# Patient Record
Sex: Female | Born: 1985 | Race: White | Hispanic: No | Marital: Married | Smoking: Current every day smoker
Health system: Southern US, Community
[De-identification: ages and names within clinical notes are randomized; demographics above are authoritative.]

## PROBLEM LIST (undated history)

## (undated) ENCOUNTER — Inpatient Hospital Stay: Payer: Self-pay

## (undated) DIAGNOSIS — F32A Depression, unspecified: Secondary | ICD-10-CM

## (undated) DIAGNOSIS — K219 Gastro-esophageal reflux disease without esophagitis: Secondary | ICD-10-CM

## (undated) DIAGNOSIS — I10 Essential (primary) hypertension: Secondary | ICD-10-CM

## (undated) DIAGNOSIS — IMO0001 Reserved for inherently not codable concepts without codable children: Secondary | ICD-10-CM

## (undated) DIAGNOSIS — O00109 Unspecified tubal pregnancy without intrauterine pregnancy: Secondary | ICD-10-CM

## (undated) DIAGNOSIS — R600 Localized edema: Secondary | ICD-10-CM

## (undated) DIAGNOSIS — Z6841 Body Mass Index (BMI) 40.0 and over, adult: Secondary | ICD-10-CM

## (undated) DIAGNOSIS — F329 Major depressive disorder, single episode, unspecified: Secondary | ICD-10-CM

## (undated) DIAGNOSIS — F419 Anxiety disorder, unspecified: Secondary | ICD-10-CM

## (undated) DIAGNOSIS — I4581 Long QT syndrome: Secondary | ICD-10-CM

## (undated) DIAGNOSIS — R030 Elevated blood-pressure reading, without diagnosis of hypertension: Secondary | ICD-10-CM

## (undated) DIAGNOSIS — O4440 Low lying placenta NOS or without hemorrhage, unspecified trimester: Secondary | ICD-10-CM

## (undated) DIAGNOSIS — Z9851 Tubal ligation status: Secondary | ICD-10-CM

## (undated) HISTORY — DX: Body Mass Index (BMI) 40.0 and over, adult: Z684

## (undated) HISTORY — PX: OTHER SURGICAL HISTORY: SHX169

## (undated) HISTORY — DX: Low lying placenta nos or without hemorrhage, unspecified trimester: O44.40

## (undated) HISTORY — DX: Elevated blood-pressure reading, without diagnosis of hypertension: R03.0

## (undated) HISTORY — DX: Reserved for inherently not codable concepts without codable children: IMO0001

## (undated) HISTORY — DX: Anxiety disorder, unspecified: F41.9

## (undated) HISTORY — DX: Unspecified tubal pregnancy without intrauterine pregnancy: O00.109

## (undated) HISTORY — PX: KNEE SURGERY: SHX244

## (undated) HISTORY — PX: TUBAL LIGATION: SHX77

## (undated) HISTORY — PX: ABDOMINAL HYSTERECTOMY: SHX81

## (undated) HISTORY — PX: CHOLECYSTECTOMY: SHX55

## (undated) HISTORY — DX: Tubal ligation status: Z98.51

---

## 2004-06-24 ENCOUNTER — Emergency Department: Payer: Self-pay | Admitting: Emergency Medicine

## 2004-07-01 ENCOUNTER — Emergency Department: Payer: Self-pay | Admitting: Emergency Medicine

## 2004-12-02 ENCOUNTER — Observation Stay: Payer: Self-pay | Admitting: Obstetrics & Gynecology

## 2004-12-10 ENCOUNTER — Observation Stay: Payer: Self-pay

## 2004-12-13 ENCOUNTER — Inpatient Hospital Stay: Payer: Self-pay | Admitting: Obstetrics & Gynecology

## 2005-01-09 DIAGNOSIS — Z9851 Tubal ligation status: Secondary | ICD-10-CM

## 2005-01-09 HISTORY — DX: Tubal ligation status: Z98.51

## 2005-02-28 ENCOUNTER — Ambulatory Visit: Payer: Self-pay | Admitting: Obstetrics & Gynecology

## 2006-06-07 ENCOUNTER — Emergency Department: Payer: Self-pay | Admitting: General Practice

## 2007-05-19 ENCOUNTER — Emergency Department: Payer: Self-pay | Admitting: Emergency Medicine

## 2007-11-18 ENCOUNTER — Ambulatory Visit: Payer: Self-pay | Admitting: Orthopedic Surgery

## 2007-11-26 ENCOUNTER — Ambulatory Visit: Payer: Self-pay | Admitting: Orthopedic Surgery

## 2008-02-12 ENCOUNTER — Emergency Department: Payer: Self-pay | Admitting: Internal Medicine

## 2008-03-10 ENCOUNTER — Ambulatory Visit: Payer: Self-pay | Admitting: Family Medicine

## 2008-06-11 ENCOUNTER — Emergency Department: Payer: Self-pay | Admitting: Emergency Medicine

## 2010-06-06 ENCOUNTER — Emergency Department: Payer: Self-pay | Admitting: Emergency Medicine

## 2011-08-03 ENCOUNTER — Emergency Department: Payer: Self-pay | Admitting: *Deleted

## 2011-08-06 LAB — BETA STREP CULTURE(ARMC)

## 2012-01-29 ENCOUNTER — Emergency Department: Payer: Self-pay | Admitting: Emergency Medicine

## 2013-03-08 ENCOUNTER — Emergency Department: Payer: Self-pay | Admitting: Emergency Medicine

## 2013-03-08 LAB — URINALYSIS, COMPLETE
BLOOD: NEGATIVE
Bilirubin,UR: NEGATIVE
Glucose,UR: NEGATIVE mg/dL (ref 0–75)
KETONE: NEGATIVE
Leukocyte Esterase: NEGATIVE
Nitrite: NEGATIVE
PROTEIN: NEGATIVE
Ph: 7 (ref 4.5–8.0)
RBC,UR: 6 /HPF (ref 0–5)
SPECIFIC GRAVITY: 1.021 (ref 1.003–1.030)
WBC UR: NONE SEEN /HPF (ref 0–5)

## 2013-03-08 LAB — COMPREHENSIVE METABOLIC PANEL
ALK PHOS: 90 U/L
Albumin: 3.7 g/dL (ref 3.4–5.0)
Anion Gap: 5 — ABNORMAL LOW (ref 7–16)
BUN: 4 mg/dL — ABNORMAL LOW (ref 7–18)
Bilirubin,Total: 0.5 mg/dL (ref 0.2–1.0)
CALCIUM: 8.6 mg/dL (ref 8.5–10.1)
CO2: 25 mmol/L (ref 21–32)
Chloride: 106 mmol/L (ref 98–107)
Creatinine: 0.7 mg/dL (ref 0.60–1.30)
EGFR (African American): >60
GLUCOSE: 89 mg/dL (ref 65–99)
OSMOLALITY: 268 (ref 275–301)
POTASSIUM: 3.4 mmol/L — AB (ref 3.5–5.1)
SGOT(AST): 23 U/L (ref 15–37)
SGPT (ALT): 29 U/L (ref 12–78)
SODIUM: 136 mmol/L (ref 136–145)
TOTAL PROTEIN: 7.5 g/dL (ref 6.4–8.2)

## 2013-03-08 LAB — CBC WITH DIFFERENTIAL/PLATELET
Basophil #: 0 10*3/uL (ref 0.0–0.1)
Basophil %: 0.5 %
EOS ABS: 0 10*3/uL (ref 0.0–0.7)
Eosinophil %: 0.2 %
HCT: 38 % (ref 35.0–47.0)
HGB: 13.2 g/dL (ref 12.0–16.0)
Lymphocyte #: 1.4 10*3/uL (ref 1.0–3.6)
Lymphocyte %: 19.8 %
MCH: 30.8 pg (ref 26.0–34.0)
MCHC: 34.8 g/dL (ref 32.0–36.0)
MCV: 88 fL (ref 80–100)
MONOS PCT: 3.9 %
Monocyte #: 0.3 x10 3/mm (ref 0.2–0.9)
NEUTROS PCT: 75.6 %
Neutrophil #: 5.2 10*3/uL (ref 1.4–6.5)
Platelet: 168 10*3/uL (ref 150–440)
RBC: 4.3 10*6/uL (ref 3.80–5.20)
RDW: 13 % (ref 11.5–14.5)
WBC: 6.9 10*3/uL (ref 3.6–11.0)

## 2013-03-08 LAB — HCG, QUANTITATIVE, PREGNANCY: BETA HCG, QUANT.: 78244 m[IU]/mL — AB

## 2013-04-10 DIAGNOSIS — Z3490 Encounter for supervision of normal pregnancy, unspecified, unspecified trimester: Secondary | ICD-10-CM | POA: Insufficient documentation

## 2013-05-14 ENCOUNTER — Ambulatory Visit: Payer: Self-pay | Admitting: Physician Assistant

## 2013-06-28 ENCOUNTER — Observation Stay: Payer: Self-pay | Admitting: Obstetrics and Gynecology

## 2013-06-28 LAB — URINALYSIS, COMPLETE
Bilirubin,UR: NEGATIVE
Blood: NEGATIVE
GLUCOSE, UR: NEGATIVE mg/dL (ref 0–75)
Ketone: NEGATIVE
LEUKOCYTE ESTERASE: NEGATIVE
Nitrite: NEGATIVE
PROTEIN: NEGATIVE
Ph: 7 (ref 4.5–8.0)
SPECIFIC GRAVITY: 1.018 (ref 1.003–1.030)
Squamous Epithelial: 4
WBC UR: 3 /HPF (ref 0–5)

## 2013-06-28 LAB — GC/CHLAMYDIA PROBE AMP

## 2013-09-17 ENCOUNTER — Observation Stay: Payer: Self-pay

## 2013-09-28 ENCOUNTER — Inpatient Hospital Stay: Payer: Self-pay

## 2013-09-29 LAB — CBC WITH DIFFERENTIAL/PLATELET
Basophil #: 0 10*3/uL (ref 0.0–0.1)
Basophil %: 0.3 %
EOS ABS: 0 10*3/uL (ref 0.0–0.7)
Eosinophil %: 0.4 %
HCT: 32.6 % — ABNORMAL LOW (ref 35.0–47.0)
HGB: 11.3 g/dL — AB (ref 12.0–16.0)
Lymphocyte #: 1.7 10*3/uL (ref 1.0–3.6)
Lymphocyte %: 17.2 %
MCH: 30.2 pg (ref 26.0–34.0)
MCHC: 34.7 g/dL (ref 32.0–36.0)
MCV: 87 fL (ref 80–100)
MONO ABS: 0.5 x10 3/mm (ref 0.2–0.9)
Monocyte %: 5.2 %
NEUTROS ABS: 7.5 10*3/uL — AB (ref 1.4–6.5)
Neutrophil %: 76.9 %
Platelet: 156 10*3/uL (ref 150–440)
RBC: 3.74 10*6/uL — AB (ref 3.80–5.20)
RDW: 14.1 % (ref 11.5–14.5)
WBC: 9.8 10*3/uL (ref 3.6–11.0)

## 2013-10-01 LAB — HEMATOCRIT: HCT: 27.2 % — AB (ref 35.0–47.0)

## 2013-10-03 ENCOUNTER — Ambulatory Visit: Payer: Self-pay | Admitting: Pediatrics

## 2013-11-07 ENCOUNTER — Emergency Department: Payer: Self-pay | Admitting: Student

## 2013-11-10 ENCOUNTER — Emergency Department: Payer: Self-pay | Admitting: Emergency Medicine

## 2013-11-17 LAB — HM PAP SMEAR
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
## 2014-08-10 DIAGNOSIS — O00109 Unspecified tubal pregnancy without intrauterine pregnancy: Secondary | ICD-10-CM

## 2014-08-10 HISTORY — DX: Unspecified tubal pregnancy without intrauterine pregnancy: O00.109

## 2014-08-11 ENCOUNTER — Encounter: Payer: Self-pay | Admitting: Emergency Medicine

## 2014-08-11 DIAGNOSIS — R110 Nausea: Secondary | ICD-10-CM | POA: Insufficient documentation

## 2014-08-11 DIAGNOSIS — Z79899 Other long term (current) drug therapy: Secondary | ICD-10-CM | POA: Insufficient documentation

## 2014-08-11 DIAGNOSIS — Z720 Tobacco use: Secondary | ICD-10-CM | POA: Diagnosis not present

## 2014-08-11 DIAGNOSIS — N8320 Unspecified ovarian cysts: Secondary | ICD-10-CM | POA: Insufficient documentation

## 2014-08-11 DIAGNOSIS — Z3202 Encounter for pregnancy test, result negative: Secondary | ICD-10-CM | POA: Insufficient documentation

## 2014-08-11 DIAGNOSIS — R102 Pelvic and perineal pain: Secondary | ICD-10-CM | POA: Diagnosis present

## 2014-08-11 LAB — URINALYSIS COMPLETE WITH MICROSCOPIC (ARMC ONLY)
BILIRUBIN URINE: NEGATIVE
Glucose, UA: NEGATIVE mg/dL
Hgb urine dipstick: NEGATIVE
KETONES UR: NEGATIVE mg/dL
NITRITE: NEGATIVE
PH: 6 (ref 5.0–8.0)
Protein, ur: NEGATIVE mg/dL
Specific Gravity, Urine: 1.008 (ref 1.005–1.030)

## 2014-08-11 LAB — COMPREHENSIVE METABOLIC PANEL
ALK PHOS: 73 U/L (ref 38–126)
ALT: 18 U/L (ref 14–54)
AST: 18 U/L (ref 15–41)
Albumin: 4 g/dL (ref 3.5–5.0)
Anion gap: 8 (ref 5–15)
BUN: 10 mg/dL (ref 6–20)
CO2: 27 mmol/L (ref 22–32)
Calcium: 9.2 mg/dL (ref 8.9–10.3)
Chloride: 102 mmol/L (ref 101–111)
Creatinine, Ser: 0.84 mg/dL (ref 0.44–1.00)
GFR calc Af Amer: >60 mL/min (ref >60)
GFR calc non Af Amer: >60 mL/min (ref >60)
Glucose, Bld: 99 mg/dL (ref 65–99)
Potassium: 3.8 mmol/L (ref 3.5–5.1)
SODIUM: 137 mmol/L (ref 135–145)
TOTAL PROTEIN: 7.1 g/dL (ref 6.5–8.1)
Total Bilirubin: 0.2 mg/dL — ABNORMAL LOW (ref 0.3–1.2)

## 2014-08-11 LAB — CBC WITH DIFFERENTIAL/PLATELET
BASOS PCT: 0 %
Basophils Absolute: 0 10*3/uL (ref 0–0.1)
Eosinophils Absolute: 0 10*3/uL (ref 0–0.7)
Eosinophils Relative: 1 %
HEMATOCRIT: 34.8 % — AB (ref 35.0–47.0)
HEMOGLOBIN: 12 g/dL (ref 12.0–16.0)
LYMPHS ABS: 1.4 10*3/uL (ref 1.0–3.6)
LYMPHS PCT: 22 %
MCH: 28.1 pg (ref 26.0–34.0)
MCHC: 34.4 g/dL (ref 32.0–36.0)
MCV: 81.8 fL (ref 80.0–100.0)
MONO ABS: 0.3 10*3/uL (ref 0.2–0.9)
Monocytes Relative: 5 %
Neutro Abs: 4.6 10*3/uL (ref 1.4–6.5)
Neutrophils Relative %: 72 %
Platelets: 139 10*3/uL — ABNORMAL LOW (ref 150–440)
RBC: 4.25 MIL/uL (ref 3.80–5.20)
RDW: 14.8 % — AB (ref 11.5–14.5)
WBC: 6.4 10*3/uL (ref 3.6–11.0)

## 2014-08-11 LAB — POCT PREGNANCY, URINE: Preg Test, Ur: NEGATIVE

## 2014-08-11 NOTE — ED Notes (Signed)
Pt presents to ED with pain that radiates across her lower abd for the past 3-4 days with nausea. Denies vomiting. Pain sharp in nature. Denies similar pain previously. Pt is alert and calm with no increased work of breathing noted at this time.

## 2014-08-12 ENCOUNTER — Emergency Department
Admission: EM | Admit: 2014-08-12 | Discharge: 2014-08-12 | Disposition: A | Discharge status: Home / Self Care | Payer: Medicaid Other | Attending: Emergency Medicine | Admitting: Emergency Medicine

## 2014-08-12 ENCOUNTER — Emergency Department: Payer: Medicaid Other

## 2014-08-12 DIAGNOSIS — N83202 Unspecified ovarian cyst, left side: Secondary | ICD-10-CM

## 2014-08-12 DIAGNOSIS — R102 Pelvic and perineal pain: Secondary | ICD-10-CM

## 2014-08-12 MED ORDER — ONDANSETRON 4 MG PO TBDP
4.0000 mg | ORAL_TABLET | Freq: Once | ORAL | Status: AC
  Administered 2014-08-12: 4 mg via ORAL
  Filled 2014-08-12: qty 1

## 2014-08-12 MED ORDER — HYDROCODONE-ACETAMINOPHEN 5-325 MG PO TABS: 1.0000 | ORAL_TABLET | ORAL | Status: DC | PRN

## 2014-08-12 MED ORDER — HYDROCODONE-ACETAMINOPHEN 5-325 MG PO TABS
1.0000 | ORAL_TABLET | Freq: Once | ORAL | Status: AC
  Administered 2014-08-12: 1 via ORAL
  Filled 2014-08-12: qty 1

## 2014-08-12 NOTE — Discharge Instructions (Signed)
Ovarian Cyst An ovarian cyst is a fluid-filled sac that forms on an ovary. The ovaries are small organs that produce eggs in women. Various types of cysts can form on the ovaries. Most are not cancerous. Many do not cause problems, and they often go away on their own. Some may cause symptoms and require treatment. Common types of ovarian cysts include:  Functional cysts--These cysts may occur every month during the menstrual cycle. This is normal. The cysts usually go away with the next menstrual cycle if the woman does not get pregnant. Usually, there are no symptoms with a functional cyst.  Endometrioma cysts--These cysts form from the tissue that lines the uterus. They are also called "chocolate cysts" because they become filled with blood that turns Nikan Ellingson. This type of cyst can cause pain in the lower abdomen during intercourse and with your menstrual period.  Cystadenoma cysts--This type develops from the cells on the outside of the ovary. These cysts can get very big and cause lower abdomen pain and pain with intercourse. This type of cyst can twist on itself, cut off its blood supply, and cause severe pain. It can also easily rupture and cause a lot of pain.  Dermoid cysts--This type of cyst is sometimes found in both ovaries. These cysts may contain different kinds of body tissue, such as skin, teeth, hair, or cartilage. They usually do not cause symptoms unless they get very big.  Theca lutein cysts--These cysts occur when too much of a certain hormone (human chorionic gonadotropin) is produced and overstimulates the ovaries to produce an egg. This is most common after procedures used to assist with the conception of a baby (in vitro fertilization). CAUSES   Fertility drugs can cause a condition in which multiple large cysts are formed on the ovaries. This is called ovarian hyperstimulation syndrome.  A condition called polycystic ovary syndrome can cause hormonal imbalances that can lead to  nonfunctional ovarian cysts. SIGNS AND SYMPTOMS  Many ovarian cysts do not cause symptoms. If symptoms are present, they may include:  Pelvic pain or pressure.  Pain in the lower abdomen.  Pain during sexual intercourse.  Increasing girth (swelling) of the abdomen.  Abnormal menstrual periods.  Increasing pain with menstrual periods.  Stopping having menstrual periods without being pregnant. DIAGNOSIS  These cysts are commonly found during a routine or annual pelvic exam. Tests may be ordered to find out more about the cyst. These tests may include:  Ultrasound.  X-ray of the pelvis.  CT scan.  MRI.  Blood tests. TREATMENT  Many ovarian cysts go away on their own without treatment. Your health care provider may want to check your cyst regularly for 2-3 months to see if it changes. For women in menopause, it is particularly important to monitor a cyst closely because of the higher rate of ovarian cancer in menopausal women. When treatment is needed, it may include any of the following:  A procedure to drain the cyst (aspiration). This may be done using a long needle and ultrasound. It can also be done through a laparoscopic procedure. This involves using a thin, lighted tube with a tiny camera on the end (laparoscope) inserted through a small incision.  Surgery to remove the whole cyst. This may be done using laparoscopic surgery or an open surgery involving a larger incision in the lower abdomen.  Hormone treatment or birth control pills. These methods are sometimes used to help dissolve a cyst. HOME CARE INSTRUCTIONS   Only take over-the-counter   or prescription medicines as directed by your health care provider.  Follow up with your health care provider as directed.  Get regular pelvic exams and Pap tests. SEEK MEDICAL CARE IF:   Your periods are late, irregular, or painful, or they stop.  Your pelvic pain or abdominal pain does not go away.  Your abdomen becomes  larger or swollen.  You have pressure on your bladder or trouble emptying your bladder completely.  You have pain during sexual intercourse.  You have feelings of fullness, pressure, or discomfort in your stomach.  You lose weight for no apparent reason.  You feel generally ill.  You become constipated.  You lose your appetite.  You develop acne.  You have an increase in body and facial hair.  You are gaining weight, without changing your exercise and eating habits.  You think you are pregnant. SEEK IMMEDIATE MEDICAL CARE IF:   You have increasing abdominal pain.  You feel sick to your stomach (nauseous), and you throw up (vomit).  You develop a fever that comes on suddenly.  You have abdominal pain during a bowel movement.  Your menstrual periods become heavier than usual. MAKE SURE YOU:  Understand these instructions.  Will watch your condition.  Will get help right away if you are not doing well or get worse. Document Released: 12/26/2004 Document Revised: 12/31/2012 Document Reviewed: 09/02/2012 [employer] Patient Information 2015 [employer]. This information is not intended to replace advice given to you by your health care provider. Make sure you discuss any questions you have with your health care provider.  

## 2014-08-12 NOTE — ED Notes (Signed)
Pt went to ultrasound.

## 2014-08-12 NOTE — ED Provider Notes (Signed)
[facility] Emergency Department Provider Note  ____________________________________________  Time seen: 4:30 AM  I have reviewed the triage vital signs and the nursing notes.   HISTORY  Chief Complaint Abdominal Pain      HPI Holly Nicholson is a 29 y.o. female presents with bilateral pelvic pain 3-4 days with nausea. Patient denies any fever no dysuria no vaginal discharge no constipation or diarrhea.    Past medical history None There are no active problems to display for this patient.   Past Surgical History  Procedure Laterality Date  . Tubal ligation    . Knee surgery      Current Outpatient Rx  Name  Route  Sig  Dispense  Refill  . ARIPiprazole (ABILIFY) 5 MG tablet   Oral   Take 5 mg by mouth daily.         . cetirizine (ZYRTEC ALLERGY) 10 MG tablet   Oral   Take 10 mg by mouth daily.         . famotidine (PEPCID) 40 MG tablet   Oral   Take 40 mg by mouth daily.         . sertraline (ZOLOFT) 100 MG tablet   Oral   Take 100 mg by mouth daily.         Marland Kitchen HYDROcodone-acetaminophen (NORCO/VICODIN) 5-325 MG per tablet   Oral   Take 1 tablet by mouth every 4 (four) hours as needed for moderate pain.   12 tablet   0     Allergies Percocet  No family history on file.  Social History Social History  Substance Use Topics  . Smoking status: Current Every Day Smoker -- 0.50 packs/day    Types: Cigarettes  . Smokeless tobacco: None  . Alcohol Use: Yes    Review of Systems  Constitutional: Negative for fever. Eyes: Negative for visual changes. ENT: Negative for sore throat. Cardiovascular: Negative for chest pain. Respiratory: Negative for shortness of breath. Gastrointestinal: Negative for abdominal pain, vomiting and diarrhea. Positive for pelvic pain Genitourinary: Negative for dysuria. Musculoskeletal: Negative for back pain. Skin: Negative for rash. Neurological: Negative for headaches, focal weakness or  numbness.   10-point ROS otherwise negative.  ____________________________________________   PHYSICAL EXAM:  VITAL SIGNS: ED Triage Vitals  Enc Vitals Group     BP 08/11/14 2242 133/72 mmHg     Pulse Rate 08/11/14 2242 82     Resp 08/11/14 2242 18     Temp 08/11/14 2242 98.8 F (37.1 C)     Temp Source 08/11/14 2242 Oral     SpO2 08/11/14 2242 100 %     Weight --      Height --      Head Cir --      Peak Flow --      Pain Score 08/12/14 0423 10     Pain Loc --      Pain Edu? --      Excl. in GC? --      Constitutional: Alert and oriented. Well appearing and in no distress. Eyes: Conjunctivae are normal. PERRL. Normal extraocular movements. ENT   Head: Normocephalic and atraumatic.   Nose: No congestion/rhinnorhea.   Mouth/Throat: Mucous membranes are moist.   Neck: No stridor. Cardiovascular: Normal rate, regular rhythm. Normal and symmetric distal pulses are present in all extremities. No murmurs, rubs, or gallops. Respiratory: Normal respiratory effort without tachypnea nor retractions. Breath sounds are clear and equal bilaterally. No wheezes/rales/rhonchi. Gastrointestinal: Soft and  nontender. No distention. There is no CVA tenderness. Genitourinary: deferred Musculoskeletal: Nontender with normal range of motion in all extremities. No joint effusions.  No lower extremity tenderness nor edema. Neurologic:  Normal speech and language. No gross focal neurologic deficits are appreciated. Speech is normal.  Skin:  Skin is warm, dry and intact. No rash noted. Psychiatric: Mood and affect are normal. Speech and behavior are normal. Patient exhibits appropriate insight and judgment.  ____________________________________________    LABS (pertinent positives/negatives)  Labs Reviewed  COMPREHENSIVE METABOLIC PANEL - Abnormal; Notable for the following:    Total Bilirubin 0.2 (*)    All other components within normal limits  CBC WITH DIFFERENTIAL/PLATELET  - Abnormal; Notable for the following:    HCT 34.8 (*)    RDW 14.8 (*)    Platelets 139 (*)    All other components within normal limits  URINALYSIS COMPLETEWITH MICROSCOPIC ([facility] ONLY) - Abnormal; Notable for the following:    Color, Urine STRAW (*)    APPearance HAZY (*)    Leukocytes, UA TRACE (*)    Bacteria, UA RARE (*)    Squamous Epithelial / LPF 6-30 (*)    All other components within normal limits  POC URINE PREG, ED  POCT PREGNANCY, URINE        RADIOLOGY     US Pelvis Complete (Final result) Result time: 08/12/14 05:23:21   Final result by Rad Results In Interface (08/12/14 05:23:21)   Narrative:   CLINICAL DATA: Initial evaluation for acute pelvic pain for 3-4 days.  EXAM: TRANSABDOMINAL AND TRANSVAGINAL ULTRASOUND OF PELVIS  TECHNIQUE: Both transabdominal and transvaginal ultrasound examinations of the pelvis were performed. Transabdominal technique was performed for global imaging of the pelvis including uterus, ovaries, adnexal regions, and pelvic cul-de-sac. It was necessary to proceed with endovaginal exam following the transabdominal exam to visualize the uterus and ovaries.  COMPARISON: None  FINDINGS: Uterus  Measurements: 8.4 x 4.2 x 5.4 cm. No fibroids or other mass visualized.  Endometrium  Thickness: 16 mm. Small 5 mm cystic lesion at the uterine fundus may reflect a small amount of trapped fluid.  Right ovary  Measurements: 3.7 x 2.8 x 2.3 cm. Normal appearance. No adnexal mass.  Left ovary  Measurements: 4.6 x 3.1 x 3.6 cm. Normal appearance. Complex cystic lesion measuring 3.2 x 2.5 x 2.5 cm, most consistent with a hemorrhagic cyst.  Other findings  No free fluid.  IMPRESSION: 1. 3.2 cm hemorrhagic left ovarian cyst. 2. Tiny 5 mm lucent lesion within the endometrial canal near the uterine fundus. Finding is favored to reflect a small amount of trapped fluid, and is of doubtful clinical significance. 3. Otherwise  unremarkable pelvic ultrasound.   Electronically Signed By: Rise Mu M.D. On: 08/12/2014 05:23           INITIAL IMPRESSION / ASSESSMENT AND PLAN / ED COURSE  Pertinent labs & imaging results that were available during my care of the patient were reviewed by me and considered in my medical decision making (see chart for details).    ____________________________________________   FINAL CLINICAL IMPRESSION(S) / ED DIAGNOSES  Final diagnoses:  Left ovarian cyst      Darci Current, MD 08/20/14 [phone]

## 2014-08-17 DIAGNOSIS — O3680X0 Pregnancy with inconclusive fetal viability, not applicable or unspecified: Secondary | ICD-10-CM | POA: Insufficient documentation

## 2014-08-20 ENCOUNTER — Encounter: Payer: Self-pay | Admitting: Family Medicine

## 2014-08-20 ENCOUNTER — Ambulatory Visit (INDEPENDENT_AMBULATORY_CARE_PROVIDER_SITE_OTHER): Payer: Medicaid Other | Admitting: Family Medicine

## 2014-08-20 VITALS — BP 114/78 | HR 82 | Temp 98.0°F | Ht 62.5 in | Wt 249.0 lb

## 2014-08-20 DIAGNOSIS — Z302 Encounter for sterilization: Secondary | ICD-10-CM | POA: Insufficient documentation

## 2014-08-20 DIAGNOSIS — R51 Headache: Secondary | ICD-10-CM

## 2014-08-20 DIAGNOSIS — O009 Unspecified ectopic pregnancy without intrauterine pregnancy: Secondary | ICD-10-CM

## 2014-08-20 DIAGNOSIS — Z0000 Encounter for general adult medical examination without abnormal findings: Secondary | ICD-10-CM | POA: Insufficient documentation

## 2014-08-20 DIAGNOSIS — F411 Generalized anxiety disorder: Secondary | ICD-10-CM | POA: Diagnosis not present

## 2014-08-20 DIAGNOSIS — R5383 Other fatigue: Secondary | ICD-10-CM | POA: Insufficient documentation

## 2014-08-20 DIAGNOSIS — F3132 Bipolar disorder, current episode depressed, moderate: Secondary | ICD-10-CM | POA: Insufficient documentation

## 2014-08-20 DIAGNOSIS — D649 Anemia, unspecified: Secondary | ICD-10-CM | POA: Diagnosis not present

## 2014-08-20 DIAGNOSIS — Z720 Tobacco use: Secondary | ICD-10-CM | POA: Insufficient documentation

## 2014-08-20 DIAGNOSIS — Z5181 Encounter for therapeutic drug level monitoring: Secondary | ICD-10-CM | POA: Diagnosis not present

## 2014-08-20 DIAGNOSIS — R519 Headache, unspecified: Secondary | ICD-10-CM | POA: Insufficient documentation

## 2014-08-20 DIAGNOSIS — R030 Elevated blood-pressure reading, without diagnosis of hypertension: Secondary | ICD-10-CM | POA: Insufficient documentation

## 2014-08-20 DIAGNOSIS — R1110 Vomiting, unspecified: Secondary | ICD-10-CM | POA: Insufficient documentation

## 2014-08-20 DIAGNOSIS — G8929 Other chronic pain: Secondary | ICD-10-CM

## 2014-08-20 DIAGNOSIS — R112 Nausea with vomiting, unspecified: Secondary | ICD-10-CM | POA: Diagnosis not present

## 2014-08-20 MED ORDER — ONDANSETRON 4 MG PO TBDP: 4.0000 mg | ORAL_TABLET | Freq: Three times a day (TID) | ORAL | Status: DC | PRN
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
## 2014-08-20 NOTE — Assessment & Plan Note (Signed)
Vomited here in the office; last phenergan around 5 am (5-6 hours prior); new Rx for zofran given

## 2014-08-20 NOTE — Assessment & Plan Note (Signed)
Talked with patient about her anxiety; will r/o rare pheo since she has anxiety attacks accompanied by headaches, high BP, flushing, anger; I am not going to put her on any medicine for her anxiety, though (which is what she was requesting today); I encouraged her instead to continue to work with her counselor to consider adjusting her medicines or doing other therapy to treat anxiety; if her anxiety is not due to a medical condition like hyperthyroidism or a pheo, then CBT may be helpful

## 2014-08-20 NOTE — Assessment & Plan Note (Signed)
Check labs; likely deconditioning; discuss exercise and diet at f/u

## 2014-08-20 NOTE — Assessment & Plan Note (Signed)
Currently under the care of psychiatrist

## 2014-08-20 NOTE — Assessment & Plan Note (Signed)
On atypical antipsychotic, pt reports no labs to monitor glucose and lipids

## 2014-08-20 NOTE — Assessment & Plan Note (Addendum)
Check CBC today; patient says she was "born with it" so will see if indices suggest a thalassemia

## 2014-08-20 NOTE — Assessment & Plan Note (Addendum)
cessation information given for tips to quit in AVS

## 2014-08-20 NOTE — Assessment & Plan Note (Signed)
Reviewed ER record from [place] which showed negative pregnancy test, but I am unable to review [facility] records where patient reports the positive pregnancy

## 2014-08-20 NOTE — Patient Instructions (Addendum)
We'll contact you about your blood work if anything is urgent prior to your next appointment  Do continue to work with your psychiatrist about your anxiety; it will be best if only one of us is prescribing medicines for your aKoreanxiety and mood and mental health  Gina --> please request records from [facility] ER visit including labs and [place] reports  Please start collecting your urine after your next episode of anxiety, high blood pressure, headache, etc. Save all of the urine for the next 24 hours and return that container to [place] for testing.  Return in 1 to 2 weeks for follow-up     Smoking Cessation, Tips for Success If you are ready to quit smoking, congratulations! You have chosen to help yourself be healthier. Cigarettes bring nicotine, tar, carbon monoxide, and other irritants into your body. Your lungs, heart, and blood vessels will be able to work better without these poisons. There are many different ways to quit smoking. Nicotine gum, nicotine patches, a nicotine inhaler, or nicotine nasal spray can help with physical craving. Hypnosis, support groups, and medicines help break the habit of smoking. WHAT THINGS CAN I DO TO MAKE QUITTING EASIER?  Here are some tips to help you quit for good:  Pick a date when you will quit smoking completely. Tell all of your friends and family about your plan to quit on that date.  Do not try to slowly cut down on the number of cigarettes you are smoking. Pick a quit date and quit smoking completely starting on that day.  Throw away all cigarettes.   Clean and remove all ashtrays from your home, work, and car.  On a card, write down your reasons for quitting. Carry the card with you and read it when you get the urge to smoke.  Cleanse your body of nicotine. Drink enough water and fluids to keep your urine clear or pale yellow. Do this after quitting to flush the nicotine from your body.  Learn to predict your moods. Do not let a bad situation be your  excuse to have a cigarette. Some situations in your life might tempt you into wanting a cigarette.  Never have "just one" cigarette. It leads to wanting another and another. Remind yourself of your decision to quit.  Change habits associated with smoking. If you smoked while driving or when feeling stressed, try other activities to replace smoking. Stand up when drinking your coffee. Brush your teeth after eating. Sit in a different chair when you read the paper. Avoid alcohol while trying to quit, and try to drink fewer caffeinated beverages. Alcohol and caffeine may urge you to smoke.  Avoid foods and drinks that can trigger a desire to smoke, such as sugary or spicy foods and alcohol.  Ask people who smoke not to smoke around you.  Have something planned to do right after eating or having a cup of coffee. For example, plan to take a walk or exercise.  Try a relaxation exercise to calm you down and decrease your stress. Remember, you may be tense and nervous for the first 2 weeks after you quit, but this will pass.  Find new activities to keep your hands busy. Play with a pen, coin, or rubber band. Doodle or draw things on paper.  Brush your teeth right after eating. This will help cut down on the craving for the taste of tobacco after meals. You can also try mouthwash.   Use oral substitutes in place of cigarettes. Try  using lemon drops, carrots, cinnamon sticks, or chewing gum. Keep them handy so they are available when you have the urge to smoke.  When you have the urge to smoke, try deep breathing.  Designate your home as a nonsmoking area.  If you are a heavy smoker, ask your health care provider about a prescription for nicotine chewing gum. It can ease your withdrawal from nicotine.  Reward yourself. Set aside the cigarette money you save and buy yourself something nice.  Look for support from others. Join a support group or smoking cessation program. Ask someone at home or at  work to help you with your plan to quit smoking.  Always ask yourself, "Do I need this cigarette or is this just a reflex?" Tell yourself, "Today, I choose not to smoke," or "I do not want to smoke." You are reminding yourself of your decision to quit.  Do not replace cigarette smoking with electronic cigarettes (commonly called e-cigarettes). The safety of e-cigarettes is unknown, and some may contain harmful chemicals.  If you relapse, do not give up! Plan ahead and think about what you will do the next time you get the urge to smoke. HOW WILL I FEEL WHEN I QUIT SMOKING? You may have symptoms of withdrawal because your body is used to nicotine (the addictive substance in cigarettes). You may crave cigarettes, be irritable, feel very hungry, cough often, get headaches, or have difficulty concentrating. The withdrawal symptoms are only temporary. They are strongest when you first quit but will go away within 10-14 days. When withdrawal symptoms occur, stay in control. Think about your reasons for quitting. Remind yourself that these are signs that your body is healing and getting used to being without cigarettes. Remember that withdrawal symptoms are easier to treat than the major diseases that smoking can cause.  Even after the withdrawal is over, expect periodic urges to smoke. However, these cravings are generally short lived and will go away whether you smoke or not. Do not smoke! WHAT RESOURCES ARE AVAILABLE TO HELP ME QUIT SMOKING? Your health care provider can direct you to community resources or hospitals for support, which may include:  Group support.  Education.  Hypnosis.  Therapy. Document Released: 09/24/2003 Document Revised: 05/12/2013 Document Reviewed: 06/13/2012 [facility] Patient Information 2015 [place], [place]. This information is not intended to replace advice given to you by your health care provider. Make sure you discuss any questions you have with your health care  provider. DASH Eating Plan DASH stands for "Dietary Approaches to Stop Hypertension." The DASH eating plan is a healthy eating plan that has been shown to reduce high blood pressure (hypertension). Additional health benefits may include reducing the risk of type 2 diabetes mellitus, heart disease, and stroke. The DASH eating plan may also help with weight loss. WHAT DO I NEED TO KNOW ABOUT THE DASH EATING PLAN? For the DASH eating plan, you will follow these general guidelines:  Choose foods with a percent daily value for sodium of less than 5% (as listed on the food label).  Use salt-free seasonings or herbs instead of table salt or sea salt.  Check with your health care provider or pharmacist before using salt substitutes.  Eat lower-sodium products, often labeled as "lower sodium" or "no salt added."  Eat fresh foods.  Eat more vegetables, fruits, and low-fat dairy products.  Choose whole grains. Look for the word "whole" as the first word in the ingredient list.  Choose fish and skinless chicken or [place]  more often than red meat. Limit fish, poultry, and meat to 6 oz (170 g) each day.  Limit sweets, desserts, sugars, and sugary drinks.  Choose heart-healthy fats.  Limit cheese to 1 oz (28 g) per day.  Eat more home-cooked food and less restaurant, buffet, and fast food.  Limit fried foods.  Cook foods using methods other than frying.  Limit canned vegetables. If you do use them, rinse them well to decrease the sodium.  When eating at a restaurant, ask that your food be prepared with less salt, or no salt if possible. WHAT FOODS CAN I EAT? Seek help from a dietitian for individual calorie needs. Grains Whole grain or whole wheat bread. Brown rice. Whole grain or whole wheat pasta. Quinoa, bulgur, and whole grain cereals. Low-sodium cereals. Corn or whole wheat flour tortillas. Whole grain cornbread. Whole grain crackers. Low-sodium crackers. Vegetables Fresh or frozen  vegetables (raw, steamed, roasted, or grilled). Low-sodium or reduced-sodium tomato and vegetable juices. Low-sodium or reduced-sodium tomato sauce and paste. Low-sodium or reduced-sodium canned vegetables.  Fruits All fresh, canned (in natural juice), or frozen fruits. Meat and Other Protein Products Ground beef (85% or leaner), grass-fed beef, or beef trimmed of fat. Skinless chicken or [place]. Ground chicken or [place]. Pork trimmed of fat. All fish and seafood. Eggs. Dried beans, peas, or lentils. Unsalted nuts and seeds. Unsalted canned beans. Dairy Low-fat dairy products, such as skim or 1% milk, 2% or reduced-fat cheeses, low-fat ricotta or cottage cheese, or plain low-fat yogurt. Low-sodium or reduced-sodium cheeses. Fats and Oils Tub margarines without trans fats. Light or reduced-fat mayonnaise and salad dressings (reduced sodium). Avocado. Safflower, olive, or canola oils. Natural peanut or almond butter. Other Unsalted popcorn and pretzels. The items listed above may not be a complete list of recommended foods or beverages. Contact your dietitian for more options. WHAT FOODS ARE NOT RECOMMENDED? Grains White bread. White pasta. White rice. Refined cornbread. Bagels and croissants. Crackers that contain trans fat. Vegetables Creamed or fried vegetables. Vegetables in a cheese sauce. Regular canned vegetables. Regular canned tomato sauce and paste. Regular tomato and vegetable juices. Fruits Dried fruits. Canned fruit in light or heavy syrup. Fruit juice. Meat and Other Protein Products Fatty cuts of meat. Ribs, chicken wings, bacon, sausage, bologna, salami, chitterlings, fatback, hot dogs, bratwurst, and packaged luncheon meats. Salted nuts and seeds. Canned beans with salt. Dairy Whole or 2% milk, cream, half-and-half, and cream cheese. Whole-fat or sweetened yogurt. Full-fat cheeses or blue cheese. Nondairy creamers and whipped toppings. Processed cheese, cheese spreads, or cheese  curds. Condiments Onion and garlic salt, seasoned salt, table salt, and sea salt. Canned and packaged gravies. Worcestershire sauce. Tartar sauce. Barbecue sauce. Teriyaki sauce. Soy sauce, including reduced sodium. Steak sauce. Fish sauce. Oyster sauce. Cocktail sauce. Horseradish. Ketchup and mustard. Meat flavorings and tenderizers. Bouillon cubes. Hot sauce. Tabasco sauce. Marinades. Taco seasonings. Relishes. Fats and Oils Butter, stick margarine, lard, shortening, ghee, and bacon fat. Coconut, palm kernel, or palm oils. Regular salad dressings. Other Pickles and olives. Salted popcorn and pretzels. The items listed above may not be a complete list of foods and beverages to avoid. Contact your dietitian for more information. WHERE CAN I FIND MORE INFORMATION? National Heart, Lung, and Blood Institute: [employer] Document Released: 12/15/2010 Document Revised: 05/12/2013 Document Reviewed: 10/30/2012 [facility] Patient Information 2015 [place], [place]. This information is not intended to replace advice given to you by your health care provider. Make sure you discuss any questions you have with  your health care provider.

## 2014-08-20 NOTE — Assessment & Plan Note (Signed)
Will have her use DASH guidelines; ddx includes pheo if episode spikes of pressures associated with headache, flushing, anger, anxiety; 24 hour urine collection ordered today

## 2014-08-20 NOTE — Assessment & Plan Note (Signed)
She denies having had a recent physical or screening labs; physical exam not done, but labs for routine preventive care collected with other blood drawn today

## 2014-08-21 ENCOUNTER — Telehealth: Payer: Self-pay | Admitting: Family Medicine

## 2014-08-21 LAB — LIPID PANEL W/O CHOL/HDL RATIO
Cholesterol, Total: 154 mg/dL (ref 100–199)
HDL: 41 mg/dL (ref >39)
LDL Calculated: 81 mg/dL (ref 0–99)
TRIGLYCERIDES: 159 mg/dL — AB (ref 0–149)
VLDL CHOLESTEROL CAL: 32 mg/dL (ref 5–40)

## 2014-08-21 LAB — COMPREHENSIVE METABOLIC PANEL
ALT: 17 IU/L (ref 0–32)
AST: 19 IU/L (ref 0–40)
Albumin/Globulin Ratio: 2.1 (ref 1.1–2.5)
Albumin: 4.4 g/dL (ref 3.5–5.5)
Alkaline Phosphatase: 81 IU/L (ref 39–117)
BUN/Creatinine Ratio: 8 (ref 8–20)
BUN: 8 mg/dL (ref 6–20)
Bilirubin Total: 0.5 mg/dL (ref 0.0–1.2)
CALCIUM: 8.9 mg/dL (ref 8.7–10.2)
CO2: 22 mmol/L (ref 18–29)
CREATININE: 0.96 mg/dL (ref 0.57–1.00)
Chloride: 103 mmol/L (ref 97–108)
GFR calc Af Amer: 92 mL/min/{1.73_m2} (ref >59)
GFR, EST NON AFRICAN AMERICAN: 80 mL/min/{1.73_m2} (ref >59)
GLUCOSE: 108 mg/dL — AB (ref 65–99)
Globulin, Total: 2.1 g/dL (ref 1.5–4.5)
POTASSIUM: 4.4 mmol/L (ref 3.5–5.2)
Sodium: 140 mmol/L (ref 134–144)
Total Protein: 6.5 g/dL (ref 6.0–8.5)

## 2014-08-21 LAB — CBC WITH DIFFERENTIAL/PLATELET
BASOS ABS: 0 10*3/uL (ref 0.0–0.2)
Basos: 0 %
EOS (ABSOLUTE): 0 10*3/uL (ref 0.0–0.4)
Eos: 0 %
Hematocrit: 34.8 % (ref 34.0–46.6)
Hemoglobin: 11.9 g/dL (ref 11.1–15.9)
IMMATURE GRANULOCYTES: 0 %
Immature Grans (Abs): 0 10*3/uL (ref 0.0–0.1)
Lymphocytes Absolute: 1 10*3/uL (ref 0.7–3.1)
Lymphs: 18 %
MCH: 28 pg (ref 26.6–33.0)
MCHC: 34.2 g/dL (ref 31.5–35.7)
MCV: 82 fL (ref 79–97)
MONOCYTES: 5 %
MONOS ABS: 0.3 10*3/uL (ref 0.1–0.9)
NEUTROS PCT: 77 %
Neutrophils Absolute: 4.1 10*3/uL (ref 1.4–7.0)
Platelets: 134 10*3/uL — ABNORMAL LOW (ref 150–379)
RBC: 4.25 x10E6/uL (ref 3.77–5.28)
RDW: 15.2 % (ref 12.3–15.4)
WBC: 5.3 10*3/uL (ref 3.4–10.8)

## 2014-08-21 LAB — HCG, BETA SUBUNIT, QN (SERIAL): HCG, Beta Chain, Quant, S: 419 m[IU]/mL

## 2014-08-21 LAB — TSH: TSH: 2.06 u[IU]/mL (ref 0.450–4.500)

## 2014-08-21 LAB — VITAMIN D 25 HYDROXY (VIT D DEFICIENCY, FRACTURES): VIT D 25 HYDROXY: 24 ng/mL — AB (ref 30.0–100.0)

## 2014-08-21 NOTE — Telephone Encounter (Signed)
I spoke with patient; she did talk to GYN and is going to have additional labs drawn to follow the hCG I discussed danger of this condition, if she does in fact have an ectopic that has not been completely treated She agrees

## 2014-08-21 NOTE — Telephone Encounter (Signed)
I called the patient and spoke to her but there were distractions, as she was at the doctor's office with her child and they were doing a strep test; child was crying At her last appt, she had been instructed to call GYN to f/u on her ER visit; I asked if she had done that, and she said not yet I did my best to explain to the patient that her serum hCG was elevated, but I had nothing to compare it to; patient thinks her last one was 300 In that case, I told her she needs to absolutely call her GYN NOW as soon as we hang up the phone and get in to be seen TODAY, and if they can't see her, she needs to go back to the hospital where they treated her on Tuesday She verified that she would call GYN as soon as we hung up -------------------------- [facility] staff -- later this afternoon, please call patient back and verify that she is either seeing GYN today or she will go to the hospital where she was treated on Tuesday; she absolutely needs to do this today They can access all of her labs that we drew through [facility] or call me with any questions Also, her vit D is low; start 2,000 iu vit D3 daily

## 2014-08-21 NOTE — Telephone Encounter (Signed)
Called and spoke with patient, Dr.Lada spoke with her to finish the discussion.

## 2014-09-03 ENCOUNTER — Ambulatory Visit: Payer: Medicaid Other | Admitting: Family Medicine

## 2014-09-04 ENCOUNTER — Telehealth: Payer: Self-pay | Admitting: Family Medicine

## 2014-09-04 ENCOUNTER — Ambulatory Visit
Admission: RE | Admit: 2014-09-04 | Discharge: 2014-09-04 | Disposition: A | Discharge status: Home / Self Care | Payer: Medicaid Other | Source: Ambulatory Visit | Attending: Family Medicine | Admitting: Family Medicine

## 2014-09-04 ENCOUNTER — Encounter: Payer: Self-pay | Admitting: Family Medicine

## 2014-09-04 ENCOUNTER — Ambulatory Visit (INDEPENDENT_AMBULATORY_CARE_PROVIDER_SITE_OTHER): Payer: Medicaid Other | Admitting: Family Medicine

## 2014-09-04 VITALS — BP 137/65 | HR 76 | Temp 98.9°F | Ht 62.75 in | Wt 252.0 lb

## 2014-09-04 DIAGNOSIS — F411 Generalized anxiety disorder: Secondary | ICD-10-CM | POA: Diagnosis not present

## 2014-09-04 DIAGNOSIS — M79671 Pain in right foot: Secondary | ICD-10-CM

## 2014-09-04 DIAGNOSIS — Z720 Tobacco use: Secondary | ICD-10-CM

## 2014-09-04 DIAGNOSIS — E559 Vitamin D deficiency, unspecified: Secondary | ICD-10-CM

## 2014-09-04 DIAGNOSIS — F3132 Bipolar disorder, current episode depressed, moderate: Secondary | ICD-10-CM

## 2014-09-04 NOTE — Progress Notes (Signed)
BP 137/65 mmHg  Pulse 76  Temp(Src) 98.9 F (37.2 C)  Ht 5' 2.75" (1.594 m)  Wt 252 lb (114.306 kg)  BMI 44.99 kg/m2  SpO2 100%  LMP 07/03/2014 (LMP Unknown)   Subjective:    Patient ID: Holly Nicholson, female    DOB: 1985-04-02, 29 y.o.   MRN: [id number]  HPI: Holly Nicholson is a 29 y.o. female  Chief Complaint  Patient presents with  . Anxiety  . Miscarriage    She states she did not have a tubal pregnancy and that [facility] gave her a shot and it made her loose the pregnancy.   She has anxiety, and sees Dr. Georjean Mode at [facility]; she is on Abilify and sertraline; just saw her two days ago; she won't change anything she says; she will start working with a therapist; she will start Sept 13th; she goes outside and sits on the porch; smokes to relieve stress too, 1 to 1.5 ppd, varies with stress, cut down from 2 ppd; parents did not smoke but grandparents did  She had a miscarriage; no abd pain, no fevers; no discharge or bleeding; her psychiatrist knows what she is going through; she did not really want to talk about it today  She thinks she fractured the top of the right foot; was bruised from the top of the right foot all thew ay around the way to the ankle, but that's gone; hurts to walk on it; has to force herself to walk; tried ice and elevating it, nothing working; swelling; pain will shoot up along the lateral right lower leg; no numbness, not cold; no injury  Relevant past medical, surgical, family and social history reviewed and updated as indicated. Interim medical history since our last visit reviewed. Allergies and medications reviewed and updated.  Review of Systems Steadily gaining weight; typical adult weight has been around 200 pounds; she would like to be 150 pounds; at age 69, she weighed 135 or 140 pounds  Per HPI unless specifically indicated above     Objective:    BP 137/65 mmHg  Pulse 76  Temp(Src) 98.9 F (37.2 C)  Ht 5' 2.75" (1.594 m)  Wt 252 lb (114.306 kg)  BMI  44.99 kg/m2  SpO2 100%  LMP 07/03/2014 (LMP Unknown)  Wt Readings from Last 3 Encounters:  09/04/14 252 lb (114.306 kg)  08/20/14 249 lb (112.946 kg)    Physical Exam  Constitutional: She appears well-developed and well-nourished. No distress.  HENT:  Head: Normocephalic and atraumatic.  Eyes: EOM are normal. No scleral icterus.  Cardiovascular: Normal rate, regular rhythm and normal heart sounds.   No murmur heard. Pulmonary/Chest: Effort normal and breath sounds normal. No respiratory distress. She has no wheezes.  Abdominal: She exhibits no distension.  Musculoskeletal: Normal range of motion. She exhibits no edema.       Right foot: There is tenderness, bony tenderness and swelling. There is normal capillary refill, no crepitus and no deformity.  Across the dorsum of the right foot; no underlying bruising or discoloration across the plantar surface; ambulatory without assistive device; no significant limp  Neurological: She is alert. She exhibits normal muscle tone.  Skin: Skin is warm and dry. She is not diaphoretic. No pallor.  Psychiatric: She has a normal mood and affect. Her behavior is normal. Judgment and thought content normal.   Results for orders placed or performed in visit on 08/20/14  hCG, Beta Subunit, Qn (Serial)  Result Value Ref Range   HCG,  Beta Chain, Quant, S 419 mIU/mL  CBC with Differential/Platelet  Result Value Ref Range   WBC 5.3 3.4 - 10.8 x10E3/uL   RBC 4.25 3.77 - 5.28 x10E6/uL   Hemoglobin 11.9 11.1 - 15.9 g/dL   Hematocrit 60.4 54.0 - 46.6 %   MCV 82 79 - 97 fL   MCH 28.0 26.6 - 33.0 pg   MCHC 34.2 31.5 - 35.7 g/dL   RDW 98.1 19.1 - 47.8 %   Platelets 134 (L) 150 - 379 x10E3/uL   Neutrophils 77 %   Lymphs 18 %   Monocytes 5 %   Eos 0 %   Basos 0 %   Neutrophils Absolute 4.1 1.4 - 7.0 x10E3/uL   Lymphocytes Absolute 1.0 0.7 - 3.1 x10E3/uL   Monocytes Absolute 0.3 0.1 - 0.9 x10E3/uL   EOS (ABSOLUTE) 0.0 0.0 - 0.4 x10E3/uL   Basophils  Absolute 0.0 0.0 - 0.2 x10E3/uL   Immature Granulocytes 0 %   Immature Grans (Abs) 0.0 0.0 - 0.1 x10E3/uL  Lipid Panel w/o Chol/HDL Ratio  Result Value Ref Range   Cholesterol, Total 154 100 - 199 mg/dL   Triglycerides 295 (H) 0 - 149 mg/dL   HDL 41 >62 mg/dL   VLDL Cholesterol Cal 32 5 - 40 mg/dL   LDL Calculated 81 0 - 99 mg/dL  TSH  Result Value Ref Range   TSH 2.060 0.450 - 4.500 uIU/mL  Vit D  25 hydroxy (rtn osteoporosis monitoring)  Result Value Ref Range   Vit D, 25-Hydroxy 24.0 (L) 30.0 - 100.0 ng/mL  Comprehensive metabolic panel  Result Value Ref Range   Glucose 108 (H) 65 - 99 mg/dL   BUN 8 6 - 20 mg/dL   Creatinine, Ser 1.30 0.57 - 1.00 mg/dL   GFR calc non Af Amer 80 >59 mL/min/1.73   GFR calc Af Amer 92 >59 mL/min/1.73   BUN/Creatinine Ratio 8 8 - 20   Sodium 140 134 - 144 mmol/L   Potassium 4.4 3.5 - 5.2 mmol/L   Chloride 103 97 - 108 mmol/L   CO2 22 18 - 29 mmol/L   Calcium 8.9 8.7 - 10.2 mg/dL   Total Protein 6.5 6.0 - 8.5 g/dL   Albumin 4.4 3.5 - 5.5 g/dL   Globulin, Total 2.1 1.5 - 4.5 g/dL   Albumin/Globulin Ratio 2.1 1.1 - 2.5   Bilirubin Total 0.5 0.0 - 1.2 mg/dL   Alkaline Phosphatase 81 39 - 117 IU/L   AST 19 0 - 40 IU/L   ALT 17 0 - 32 IU/L      Assessment & Plan:   Problem List Items Addressed This Visit      Other   Generalized anxiety disorder    Try relaxation response; encouraged her to work with counselor; she does not sound like she is going to go with an open attitude and had wanted pills at last visit; talk with psychiatrist and consider meditation, relaxation, etc. (nonpharmacologic means of dealing with anxiety)      Bipolar affective disorder, currently depressed, moderate    Continue care with psychiatrist      Tobacco abuse    She is not quite ready to quit, but I am here to help if needed      Vitamin D deficiency    She is taking supplementation, 2000 iu vit D3 daily; do that for several months, then back down to 1000  daily; little bit sunshine helps vit D and helps mood  Foot pain, right - Primary    No known trauma; there is definite swelling and pain; will get xray of right foot, further disposition depending upon results; rest, ice, compression, elevation      Relevant Orders   DG Foot Complete Right (Completed)   Morbid obesity    Encouraged smaller portions, smart weight loss efforts, gradual and slow loss          Follow up plan: Return in about 3 months (around 12/05/2014) for follow-up.

## 2014-09-04 NOTE — Telephone Encounter (Signed)
I spoke with patient; no fracture; likely sprain; R-I-C-E Should get better on its own

## 2014-09-04 NOTE — Assessment & Plan Note (Signed)
She is taking supplementation, 2000 iu vit D3 daily; do that for several months, then back down to 1000 daily; little bit sunshine helps vit D and helps mood

## 2014-09-04 NOTE — Assessment & Plan Note (Addendum)
Try relaxation response; encouraged her to work with counselor; she does not sound like she is going to go with an open attitude and had wanted pills at last visit; talk with psychiatrist and consider meditation, relaxation, etc. (nonpharmacologic means of dealing with anxiety)

## 2014-09-04 NOTE — Assessment & Plan Note (Signed)
She is not quite ready to quit, but I am here to help if needed

## 2014-09-04 NOTE — Patient Instructions (Addendum)
Continue the vitamin D I am here to help you quit smoking if and when the day comes Try the Relaxation Response Work on modest weight loss cutting back on total calories and empty calories If you can cut out 250 calories a day, you will lose 26 pounds in one year Do please do the urine test Have the xray done today and I will contact you about the results   Steps to Elicit the Relaxation Response The following is the technique reprinted with permission from Dr. Billy Nicholson book The Relaxation Response pages 162-163 1. Sit quietly in a comfortable position. 2. Close your eyes. 3. Deeply relax all your muscles,  beginning at your feet and progressing up to your face.  Keep them relaxed. 4. Breathe through your nose.  Become aware of your breathing.  As you breathe out, say the word, "one"*,  silently to yourself. For example,  breathe in ... out, "one",- in .. out, "one", etc.  Breathe easily and naturally. 5. Continue for 10 to 20 minutes.  You may open your eyes to check the time, but do not use an alarm.  When you finish, sit quietly for several minutes,  at first with your eyes closed and later with your eyes opened.  Do not stand up for a few minutes. 6. Do not worry about whether you are successful  in achieving a deep level of relaxation.  Maintain a passive attitude and permit relaxation to occur at its own pace.  When distracting thoughts occur,  try to ignore them by not dwelling upon them  and return to repeating "one."  With practice, the response should come with little effort.  Practice the technique once or twice daily,  but not within two hours after any meal,  since the digestive processes seem to interfere with  the elicitation of the Relaxation Response. * It is better to use a soothing, mellifluous sound, preferably with no meaning. or association, to avoid stimulation of unnecessary thoughts - a mantra.

## 2014-09-06 NOTE — Assessment & Plan Note (Signed)
Encouraged smaller portions, smart weight loss efforts, gradual and slow loss

## 2014-09-06 NOTE — Assessment & Plan Note (Signed)
Continue care with psychiatrist

## 2014-09-06 NOTE — Assessment & Plan Note (Signed)
No known trauma; there is definite swelling and pain; will get xray of right foot, further disposition depending upon results; rest, ice, compression, elevation

## 2014-12-09 ENCOUNTER — Ambulatory Visit: Payer: Medicaid Other | Admitting: Family Medicine

## 2014-12-22 ENCOUNTER — Encounter: Payer: Self-pay | Admitting: Emergency Medicine

## 2014-12-22 ENCOUNTER — Emergency Department
Admission: EM | Admit: 2014-12-22 | Discharge: 2014-12-22 | Disposition: A | Discharge status: Home / Self Care | Payer: Medicaid Other | Attending: Emergency Medicine | Admitting: Emergency Medicine

## 2014-12-22 DIAGNOSIS — Z3202 Encounter for pregnancy test, result negative: Secondary | ICD-10-CM | POA: Insufficient documentation

## 2014-12-22 DIAGNOSIS — N390 Urinary tract infection, site not specified: Secondary | ICD-10-CM | POA: Diagnosis not present

## 2014-12-22 DIAGNOSIS — Z79899 Other long term (current) drug therapy: Secondary | ICD-10-CM | POA: Insufficient documentation

## 2014-12-22 DIAGNOSIS — R350 Frequency of micturition: Secondary | ICD-10-CM | POA: Diagnosis present

## 2014-12-22 DIAGNOSIS — F17210 Nicotine dependence, cigarettes, uncomplicated: Secondary | ICD-10-CM | POA: Insufficient documentation

## 2014-12-22 LAB — URINALYSIS COMPLETE WITH MICROSCOPIC (ARMC ONLY)
Bilirubin Urine: NEGATIVE
Glucose, UA: NEGATIVE mg/dL
Ketones, ur: NEGATIVE mg/dL
Nitrite: NEGATIVE
Protein, ur: 100 mg/dL — AB
Specific Gravity, Urine: 1.021 (ref 1.005–1.030)
pH: 6 (ref 5.0–8.0)

## 2014-12-22 LAB — POCT PREGNANCY, URINE: Preg Test, Ur: NEGATIVE

## 2014-12-22 MED ORDER — NITROFURANTOIN MONOHYD MACRO 100 MG PO CAPS
100.0000 mg | ORAL_CAPSULE | Freq: Once | ORAL | Status: AC
  Administered 2014-12-22: 100 mg via ORAL
  Filled 2014-12-22 (×2): qty 1

## 2014-12-22 MED ORDER — PHENAZOPYRIDINE HCL 200 MG PO TABS: 200.0000 mg | ORAL_TABLET | Freq: Three times a day (TID) | ORAL | Status: DC | PRN

## 2014-12-22 MED ORDER — NITROFURANTOIN MONOHYD MACRO 100 MG PO CAPS: 100.0000 mg | ORAL_CAPSULE | Freq: Two times a day (BID) | ORAL | Status: AC

## 2014-12-22 MED ORDER — KETOROLAC TROMETHAMINE 60 MG/2ML IM SOLN
60.0000 mg | Freq: Once | INTRAMUSCULAR | Status: AC
  Administered 2014-12-22: 60 mg via INTRAMUSCULAR
  Filled 2014-12-22: qty 2

## 2014-12-22 NOTE — Discharge Instructions (Signed)
Please seek medical attention for any high fevers, chest pain, shortness of breath, change in behavior, persistent vomiting, bloody stool or any other new or concerning symptoms. ° ° °Urinary Tract Infection °Urinary tract infections (UTIs) can develop anywhere along your urinary tract. Your urinary tract is your body's drainage system for removing wastes and extra water. Your urinary tract includes two kidneys, two ureters, a bladder, and a urethra. Your kidneys are a pair of bean-shaped organs. Each kidney is about the size of your fist. They are located below your ribs, one on each side of your spine. °CAUSES °Infections are caused by microbes, which are microscopic organisms, including fungi, viruses, and bacteria. These organisms are so small that they can only be seen through a microscope. Bacteria are the microbes that most commonly cause UTIs. °SYMPTOMS  °Symptoms of UTIs may vary by age and gender of the patient and by the location of the infection. Symptoms in young women typically include a frequent and intense urge to urinate and a painful, burning feeling in the bladder or urethra during urination. Older women and men are more likely to be tired, shaky, and weak and have muscle aches and abdominal pain. A fever may mean the infection is in your kidneys. Other symptoms of a kidney infection include pain in your back or sides below the ribs, nausea, and vomiting. °DIAGNOSIS °To diagnose a UTI, your caregiver will ask you about your symptoms. Your caregiver will also ask you to provide a urine sample. The urine sample will be tested for bacteria and white blood cells. White blood cells are made by your body to help fight infection. °TREATMENT  °Typically, UTIs can be treated with medication. Because most UTIs are caused by a bacterial infection, they usually can be treated with the use of antibiotics. The choice of antibiotic and length of treatment depend on your symptoms and the type of bacteria causing  your infection. °HOME CARE INSTRUCTIONS °· If you were prescribed antibiotics, take them exactly as your caregiver instructs you. Finish the medication even if you feel better after you have only taken some of the medication. °· Drink enough water and fluids to keep your urine clear or pale yellow. °· Avoid caffeine, tea, and carbonated beverages. They tend to irritate your bladder. °· Empty your bladder often. Avoid holding urine for long periods of time. °· Empty your bladder before and after sexual intercourse. °· After a bowel movement, women should cleanse from front to back. Use each tissue only once. °SEEK MEDICAL CARE IF:  °· You have back pain. °· You develop a fever. °· Your symptoms do not begin to resolve within 3 days. °SEEK IMMEDIATE MEDICAL CARE IF:  °· You have severe back pain or lower abdominal pain. °· You develop chills. °· You have nausea or vomiting. °· You have continued burning or discomfort with urination. °MAKE SURE YOU:  °· Understand these instructions. °· Will watch your condition. °· Will get help right away if you are not doing well or get worse. °  °This information is not intended to replace advice given to you by your health care provider. Make sure you discuss any questions you have with your health care provider. °  °Document Released: 10/05/2004 Document Revised: 09/16/2014 Document Reviewed: 02/03/2011 °Elsevier Interactive Patient Education ©2016 [employer] ° °

## 2014-12-22 NOTE — ED Notes (Signed)
urinary frequency very little results painful buring started 1 week ago

## 2014-12-22 NOTE — ED Notes (Signed)
Dr. Goodman at bedside.  

## 2014-12-22 NOTE — ED Notes (Signed)
Pt ambulated to restroom with steady gait.

## 2014-12-22 NOTE — ED Notes (Addendum)
Pt presents to ED with a UTI. Pt states she has "been fighting it for a week". Pt has tried cranberry pills and "nothing but water" and states nothing is working." Pt reports burning with urination, having to urinate frequently.

## 2014-12-22 NOTE — ED Provider Notes (Signed)
[facility] Emergency Department Provider Note    ____________________________________________  Time seen: 2150  I have reviewed the triage vital signs and the nursing notes.   HISTORY  Chief Complaint Urinary Frequency   History limited by: Not Limited   HPI Holly Nicholson is a 29 y.o. female who presents to the emergency department today because of concerns for dysuria. The patient states his symptoms started 1 week ago. They've gradually gotten worse. She states she has the feeling of increased frequency and incomplete voiding. In addition she has noticed some dysuria. She has tried cranberry pills as well as increased hydration without success. The patient denies any fevers. She denies any flank pain.   Past Medical History  Diagnosis Date  . Tubal pregnancy Aug 2016  . Elevated blood pressure   . Anxiety     Patient Active Problem List   Diagnosis Date Noted  . Morbid obesity (HCC) 09/06/2014  . Vitamin D deficiency 09/04/2014  . Foot pain, right 09/04/2014  . Generalized anxiety disorder 08/20/2014  . Bipolar affective disorder, currently depressed, moderate (HCC) 08/20/2014  . Other fatigue 08/20/2014  . Tobacco abuse 08/20/2014  . Preventative health care 08/20/2014  . Transient elevated blood pressure 08/20/2014  . Medication monitoring encounter 08/20/2014    Past Surgical History  Procedure Laterality Date  . Knee surgery    . Tubal ligation      Current Outpatient Rx  Name  Route  Sig  Dispense  Refill  . ARIPiprazole (ABILIFY) 5 MG tablet   Oral   Take 5 mg by mouth daily.         . cetirizine (ZYRTEC ALLERGY) 10 MG tablet   Oral   Take 10 mg by mouth daily.         . famotidine (PEPCID) 40 MG tablet   Oral   Take 40 mg by mouth daily.         . nitrofurantoin, macrocrystal-monohydrate, (MACROBID) 100 MG capsule   Oral   Take 1 capsule (100 mg total) by mouth 2 (two) times daily.   10 capsule   0   .  ondansetron (ZOFRAN ODT) 4 MG disintegrating tablet   Oral   Take 1 tablet (4 mg total) by mouth every 8 (eight) hours as needed for nausea or vomiting. Don't use with phenergan   20 tablet   0   . phenazopyridine (PYRIDIUM) 200 MG tablet   Oral   Take 1 tablet (200 mg total) by mouth 3 (three) times daily as needed for pain.   20 tablet   0   . promethazine (PHENERGAN) 12.5 MG tablet   Oral   Take 12.5 mg by mouth every 6 (six) hours as needed for nausea or vomiting.         . sertraline (ZOLOFT) 100 MG tablet   Oral   Take 100 mg by mouth daily.           Allergies Percocet  Family History  Problem Relation Age of Onset  . Heart disease Mother   . Hyperlipidemia Mother   . Hypertension Mother   . Diabetes Father   . Heart disease Father   . Hyperlipidemia Father   . Hypertension Father   . Stroke Father   . Cancer Maternal Grandfather   . Cancer Paternal Grandmother     Social History Social History  Substance Use Topics  . Smoking status: Current Every Day Smoker -- 0.50 packs/day    Types:  Cigarettes  . Smokeless tobacco: Never Used  . Alcohol Use: Yes     Comment: occasionally    Review of Systems  Constitutional: Negative for fever. Cardiovascular: Negative for chest pain. Respiratory: Negative for shortness of breath. Gastrointestinal: Positive for suprapubic pain Genitourinary: Positive for dysuria. Musculoskeletal: Negative for back pain. Skin: Negative for rash. Neurological: Negative for headaches, focal weakness or numbness.   10-point ROS otherwise negative.  ____________________________________________   PHYSICAL EXAM:  VITAL SIGNS: ED Triage Vitals  Enc Vitals Group     BP 12/22/14 2103 134/85 mmHg     Pulse Rate 12/22/14 2103 82     Resp --      Temp 12/22/14 2103 98.5 F (36.9 C)     Temp Source 12/22/14 2103 Oral     SpO2 12/22/14 2103 100 %     Weight 12/22/14 2103 200 lb (90.719 kg)     Height 12/22/14 2103   (1.6 m)     Head Cir --      Peak Flow --      Pain Score 12/22/14 2113 10   Constitutional: Alert and oriented. Well appearing and in no distress. Eyes: Conjunctivae are normal. PERRL. Normal extraocular movements. ENT   Head: Normocephalic and atraumatic.   Nose: No congestion/rhinnorhea.   Mouth/Throat: Mucous membranes are moist.   Neck: No stridor. Hematological/Lymphatic/Immunilogical: No cervical lymphadenopathy. Cardiovascular: Normal rate, regular rhythm.  No murmurs, rubs, or gallops. Respiratory: Normal respiratory effort without tachypnea nor retractions. Breath sounds are clear and equal bilaterally. No wheezes/rales/rhonchi. Gastrointestinal: Soft, minimally tender to palpation in the suprapubic region. No rebound. No guarding. Genitourinary: Deferred Musculoskeletal: Normal range of motion in all extremities. No joint effusions.  No lower extremity tenderness nor edema. Neurologic:  Normal speech and language. No gross focal neurologic deficits are appreciated.  Skin:  Skin is warm, dry and intact. No rash noted. Psychiatric: Mood and affect are normal. Speech and behavior are normal. Patient exhibits appropriate insight and judgment.  ____________________________________________    LABS (pertinent positives/negatives)  Labs Reviewed  URINALYSIS COMPLETEWITH MICROSCOPIC ([facility] ONLY) - Abnormal; Notable for the following:    Color, Urine YELLOW (*)    APPearance CLOUDY (*)    Hgb urine dipstick 2+ (*)    Protein, ur 100 (*)    Leukocytes, UA 3+ (*)    Bacteria, UA RARE (*)    Squamous Epithelial / LPF 0-5 (*)    All other components within normal limits  POCT PREGNANCY, URINE  POC URINE PREG, ED     ____________________________________________   EKG  None  ____________________________________________    RADIOLOGY  None   ____________________________________________   PROCEDURES  Procedure(s) performed: None  Critical Care  performed: No  ____________________________________________   INITIAL IMPRESSION / ASSESSMENT AND PLAN / ED COURSE  Pertinent labs & imaging results that were available during my care of the patient were reviewed by me and considered in my medical decision making (see chart for details).  Patient presented to the emergency department today because of concerns for dysuria. Urine is consistent with a urinary tract infection. No CVA tenderness. Will give dose of antibiotics here and discharged home with antibiotics Pyridium.  ____________________________________________   FINAL CLINICAL IMPRESSION(S) / ED DIAGNOSES  Final diagnoses:  UTI (lower urinary tract infection)     Phineas Semen, MD 12/22/14 2236

## 2015-01-21 ENCOUNTER — Emergency Department
Admission: EM | Admit: 2015-01-21 | Discharge: 2015-01-21 | Disposition: A | Discharge status: Home / Self Care | Payer: Medicaid Other | Attending: Emergency Medicine | Admitting: Emergency Medicine

## 2015-01-21 DIAGNOSIS — M79601 Pain in right arm: Secondary | ICD-10-CM | POA: Diagnosis present

## 2015-01-21 DIAGNOSIS — IMO0002 Reserved for concepts with insufficient information to code with codable children: Secondary | ICD-10-CM

## 2015-01-21 DIAGNOSIS — M778 Other enthesopathies, not elsewhere classified: Secondary | ICD-10-CM | POA: Diagnosis not present

## 2015-01-21 DIAGNOSIS — F17210 Nicotine dependence, cigarettes, uncomplicated: Secondary | ICD-10-CM | POA: Diagnosis not present

## 2015-01-21 DIAGNOSIS — Z79899 Other long term (current) drug therapy: Secondary | ICD-10-CM | POA: Insufficient documentation

## 2015-01-21 MED ORDER — IBUPROFEN 800 MG PO TABS: 800.0000 mg | ORAL_TABLET | Freq: Three times a day (TID) | ORAL | Status: DC | PRN

## 2015-01-21 MED ORDER — IBUPROFEN 800 MG PO TABS
800.0000 mg | ORAL_TABLET | Freq: Once | ORAL | Status: AC
  Administered 2015-01-21: 800 mg via ORAL
  Filled 2015-01-21: qty 1

## 2015-01-21 NOTE — ED Provider Notes (Signed)
[facility] Emergency Department Provider Note  ____________________________________________  Time seen: Approximately 10:28 PM  I have reviewed the triage vital signs and the nursing notes.   HISTORY  Chief Complaint Arm Injury    HPI Holly Nicholson is a 30 y.o. female with 1-2 weeks of right arm pain. No injury, but uses her arm regularly. Pain is aggravated by use of the right forearm. She feels the arm is swollen. No blunt trauma.   Past Medical History  Diagnosis Date  . Tubal pregnancy Aug 2016  . Elevated blood pressure   . Anxiety     Patient Active Problem List   Diagnosis Date Noted  . Morbid obesity (HCC) 09/06/2014  . Vitamin D deficiency 09/04/2014  . Foot pain, right 09/04/2014  . Generalized anxiety disorder 08/20/2014  . Bipolar affective disorder, currently depressed, moderate (HCC) 08/20/2014  . Other fatigue 08/20/2014  . Tobacco abuse 08/20/2014  . Preventative health care 08/20/2014  . Transient elevated blood pressure 08/20/2014  . Medication monitoring encounter 08/20/2014    Past Surgical History  Procedure Laterality Date  . Knee surgery    . Tubal ligation      Current Outpatient Rx  Name  Route  Sig  Dispense  Refill  . ARIPiprazole (ABILIFY) 5 MG tablet   Oral   Take 5 mg by mouth daily.         . cetirizine (ZYRTEC ALLERGY) 10 MG tablet   Oral   Take 10 mg by mouth daily.         . famotidine (PEPCID) 40 MG tablet   Oral   Take 40 mg by mouth daily.         Marland Kitchen ibuprofen (ADVIL,MOTRIN) 800 MG tablet   Oral   Take 1 tablet (800 mg total) by mouth every 8 (eight) hours as needed.   15 tablet   0   . ondansetron (ZOFRAN ODT) 4 MG disintegrating tablet   Oral   Take 1 tablet (4 mg total) by mouth every 8 (eight) hours as needed for nausea or vomiting. Don't use with phenergan   20 tablet   0   . phenazopyridine (PYRIDIUM) 200 MG tablet   Oral   Take 1 tablet (200 mg total) by mouth 3 (three)  times daily as needed for pain.   20 tablet   0   . promethazine (PHENERGAN) 12.5 MG tablet   Oral   Take 12.5 mg by mouth every 6 (six) hours as needed for nausea or vomiting.         . sertraline (ZOLOFT) 100 MG tablet   Oral   Take 100 mg by mouth daily.           Allergies Percocet  Family History  Problem Relation Age of Onset  . Heart disease Mother   . Hyperlipidemia Mother   . Hypertension Mother   . Diabetes Father   . Heart disease Father   . Hyperlipidemia Father   . Hypertension Father   . Stroke Father   . Cancer Maternal Grandfather   . Cancer Paternal Grandmother     Social History Social History  Substance Use Topics  . Smoking status: Current Every Day Smoker -- 0.50 packs/day    Types: Cigarettes  . Smokeless tobacco: Never Used  . Alcohol Use: Yes     Comment: occasionally    Review of Systems Constitutional: No fever/chills Eyes: No visual changes. ENT: No sore throat. Cardiovascular: Denies chest pain.  Respiratory: Denies shortness of breath. Skin: Negative for rash. Neurological: Negative for headaches, focal weakness or numbness. 10-point ROS otherwise negative.  ____________________________________________   PHYSICAL EXAM:  VITAL SIGNS: ED Triage Vitals  Enc Vitals Group     BP 01/21/15 2159 136/74 mmHg     Pulse Rate 01/21/15 2159 83     Resp 01/21/15 2159 18     Temp 01/21/15 2159 98.9 F (37.2 C)     Temp Source 01/21/15 2159 Oral     SpO2 01/21/15 2159 98 %     Weight 01/21/15 2159 258 lb 14.4 oz (117.436 kg)     Height 01/21/15 2159 5\' 3"  (1.6 m)     Head Cir --      Peak Flow --      Pain Score 01/21/15 2202 10     Pain Loc --      Pain Edu? --      Excl. in GC? --     Constitutional: Alert and oriented. Well appearing and in no acute distress. Cardiovascular: Normal rate, regular rhythm. Grossly normal heart sounds.  Good peripheral circulation. Respiratory: Normal respiratory effort.  No retractions.  Lungs CTAB. Musculoskeletal: tender to the right forearm muscle, as well as the elbow medial/lateral epicondyles.  Pain with ROM of the elbow.  No redness or warmth.  Neurologic:  Normal speech and language. No gross focal neurologic deficits are appreciated. No gait instability. Skin:  Skin is warm, dry and intact. No rash noted. Psychiatric: Mood and affect are normal. Speech and behavior are normal.  ____________________________________________   LABS (all labs ordered are listed, but only abnormal results are displayed)  Labs Reviewed - No data to display ____________________________________________  EKG   ____________________________________________  RADIOLOGY   ____________________________________________   PROCEDURES  Procedure(s) performed: None  Critical Care performed: No  ____________________________________________   INITIAL IMPRESSION / ASSESSMENT AND PLAN / ED COURSE  Pertinent labs & imaging results that were available during my care of the patient were reviewed by me and considered in my medical decision making (see chart for details).  30 year old female with pain to the right forearm radiating down towards the wrist. Worse with movement. Consistent with forearm strain and epicondylitis. I stressed the importance of resting the muscle. She is given a arm sling. Encouraged ice, ibuprofen. She will follow-up with orthopedics next week if not improving. ____________________________________________   FINAL CLINICAL IMPRESSION(S) / ED DIAGNOSES  Final diagnoses:  Tendonitis of elbow or forearm      Ignacia BayleyRobert Anani Gu, PA-C 01/21/15 2232  Minna AntisKevin Paduchowski, MD 01/21/15 2311

## 2015-01-21 NOTE — Discharge Instructions (Signed)
Tendinitis and Tenosynovitis  °Tendinitis is inflammation of the tendon. Tenosynovitis is inflammation of the lining around the tendon (tendon sheath). These painful conditions often occur at once. Tendons attach muscle to bone. To move a limb, force from the muscle moves through the tendon, to the bone. These conditions often cause increased pain when moving. Tendinitis may be caused by a small or partial tear in the tendon.  °SYMPTOMS  °· Pain, tenderness, redness, bruising, or swelling at the injury. °· Loss of normal joint movement. °· Pain that gets worse with use of the muscle and joint attached to the tendon. °· Weakness in the tendon, caused by calcium build up that may occur with tendinitis. °· Commonly affected tendons: °¨ Achilles tendon (calf of leg). °¨ Rotator cuff (shoulder joint). °¨ Patellar tendon (kneecap to shin). °¨ Peroneal tendon (ankle). °¨ Posterior tibial tendon (inner ankle). °¨ Biceps tendon (in front of shoulder). °CAUSES  °· Sudden strain on a flexed muscle, muscle overuse, sudden increase or change in activity, vigorous activity. °· Result of a direct hit (less common). °· Poor muscle action (biomechanics). °RISK INCREASES WITH: °· Injury (trauma). °· Too much exercise. °· Sudden change in athletic activity. °· Incorrect exercise form or technique. °· Poor strength and flexibility. °· Not warming-up properly before activity. °· Returning to activity before healing is complete. °PREVENTION  °· Warm-up and stretch properly before activity. °· Maintain physical fitness: °¨ Joint flexibility. °¨ Muscle strength and endurance. °¨ Fitness that increases heart rate. °· Learn and use proper exercise techniques. °· Use rehabilitation exercises to strengthen weak muscles and tendons. °· Ice the tendon after activity, to reduce recurring inflammation. °· Wear proper fitting protective equipment for specific tendons, when indicated. °PROGNOSIS  °When treated properly, can be cured in 6 to 8 weeks.  Recovery may take longer, depending on degree of injury.  °RELATED COMPLICATIONS  °· Re-injury or recurring symptoms. °· Permanent weakness or joint stiffness, if injury is severe and recovery is not completed. °· Delayed healing, if sports are started before healing is complete. °· Tearing apart (rupture) of the inflamed tendon. Tendinitis means the tendon is injured and must recover. °TREATMENT  °Treatment first involves ice, medicine, and rest from aggravating activities. This reduces pain and inflammation. Modifying your activity may be considered to prevent recurring injury. A brace, elastic bandage wrap, splint, cast, or sling may be prescribed to protect the joint for a short period. After that period, strengthening and stretching exercise may help to regain strength and full range of motion. If the condition persists, despite non-surgical treatment, surgery may be recommended to remove the inflamed tendon lining. Corticosteroid injections may be given to reduce inflammation. However, these injections may weaken the tendon and increase your risk for tendon rupture. °MEDICATION  °· If pain medicine is needed, nonsteroidal anti-inflammatory medicines (aspirin and ibuprofen), or other minor pain relievers (acetaminophen), are often recommended. °· Do not take pain medicine for 7 days before surgery. °· Prescription pain relievers are usually prescribed only after surgery. Use only as directed and only as much as you need. °· Ointments applied to the skin may be helpful. °· Corticosteroid injections may be given to reduce inflammation. However, this may increase your risk of a tendon rupture. °HEAT AND COLD °· Cold treatment (icing) relieves pain and reduces inflammation. Cold treatment should be applied for 10 to 15 minutes every 2 to 3 hours, and immediately after activity that aggravates your symptoms. Use ice packs or an ice massage. °· Heat   treatment may be used before performing stretching and strengthening  activities prescribed by your caregiver, physical therapist, or athletic trainer. Use a heat pack or a warm water soak. SEEK MEDICAL CARE IF:   Symptoms get worse or do not improve, despite treatment.  Pain becomes too much to tolerate.  You develop numbness or tingling.  Toes become cold, or toenails become blue, gray, or dark colored.  New, unexplained symptoms develop. (Drugs used in treatment may produce side effects.)   This information is not intended to replace advice given to you by your health care provider. Make sure you discuss any questions you have with your health care provider.   Document Released: 12/26/2004 Document Revised: 03/20/2011 Document Reviewed: 04/09/2008 Elsevier Interactive Patient Education 2016 [employer]   Rest the right arm and apply ice several times a day for at least 30 minutes. Take ibuprofen as directed for pain and swelling. Follow-up with the orthopedist next week if not improving. Return to the emergency room for any concerns.

## 2015-01-21 NOTE — ED Notes (Signed)
Pt with co right arm pain and numbness for a week, states no injury but does a lot of heavy lifting at work.

## 2015-01-21 NOTE — ED Notes (Signed)
Patient states she is having episodes of right arm grip weakness marked by increased pain at mid forearm.  She states the pain is located at mid forearm and radiates to her humerus and wrist simultaneously.

## 2015-04-29 ENCOUNTER — Ambulatory Visit: Payer: Medicaid Other | Admitting: Family Medicine

## 2015-06-20 IMAGING — US US OB < 14 WEEKS
1 series · 14 of 27 positions shown · non-contrast
Comparison: None.

CLINICAL DATA: Twelve weeks pregnant with nausea.

EXAM:
OBSTETRIC <14 WK ULTRASOUND
TECHNIQUE: Transabdominal ultrasound was performed for evaluation of the
gestation as well as the maternal uterus and adnexal regions.

[Series 1: us ob < 14 weeks · 0.25mm/px · 27 acquisitions, 14 frames shown]
[im 1/27]
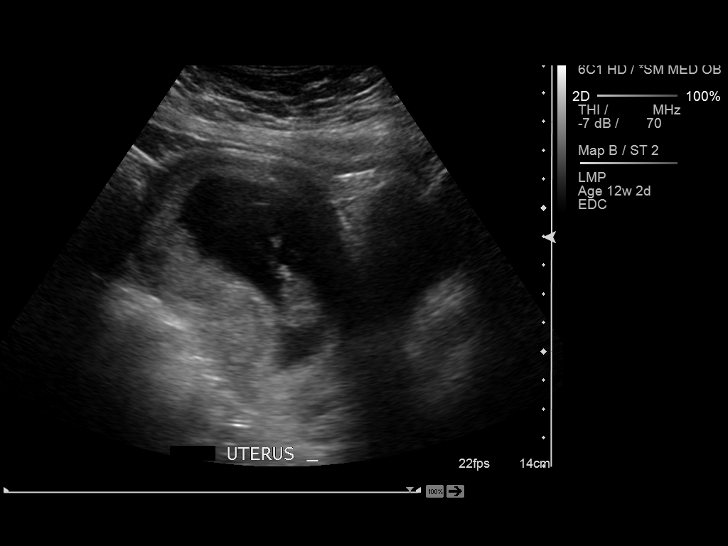
[im 3/27]
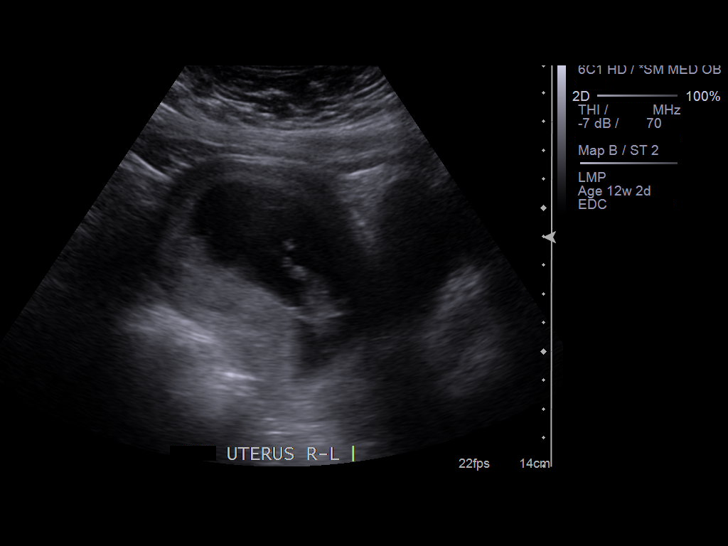
[im 5/27]
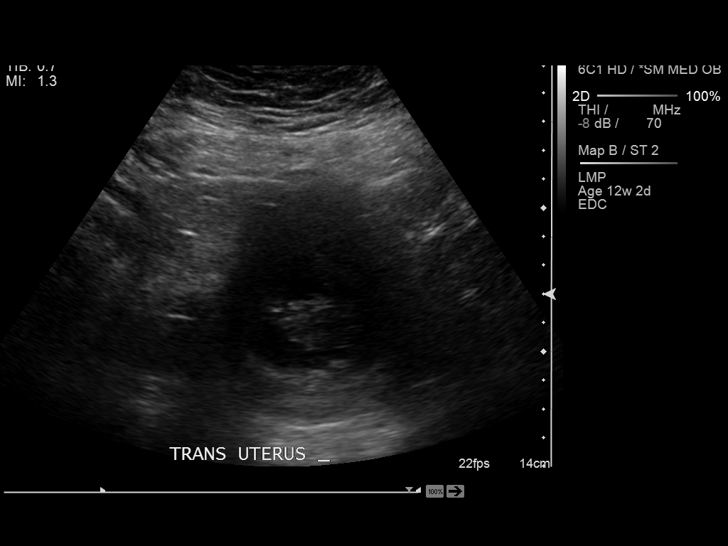
[im 7/27]
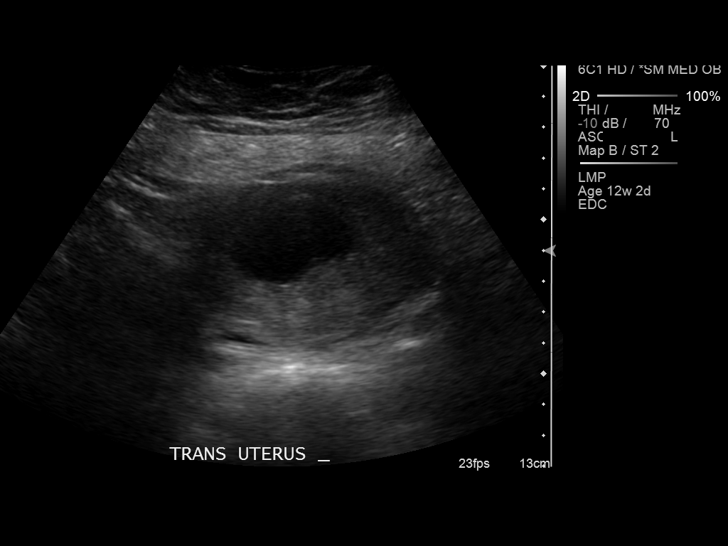
[im 9/27]
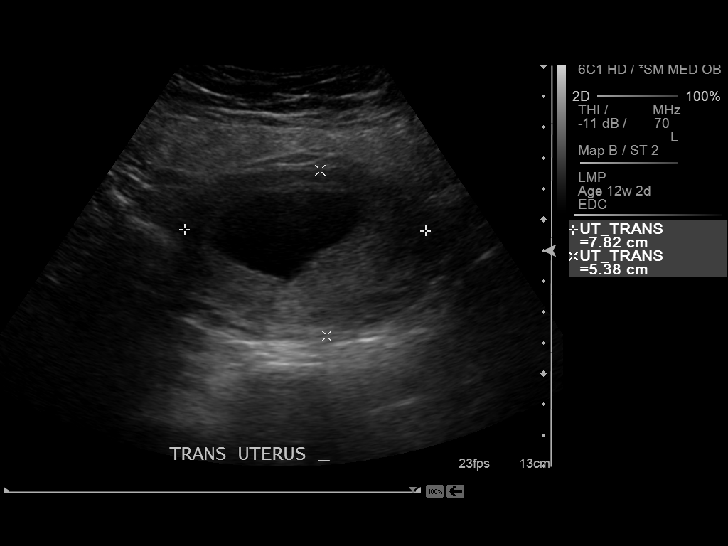
[im 11/27]
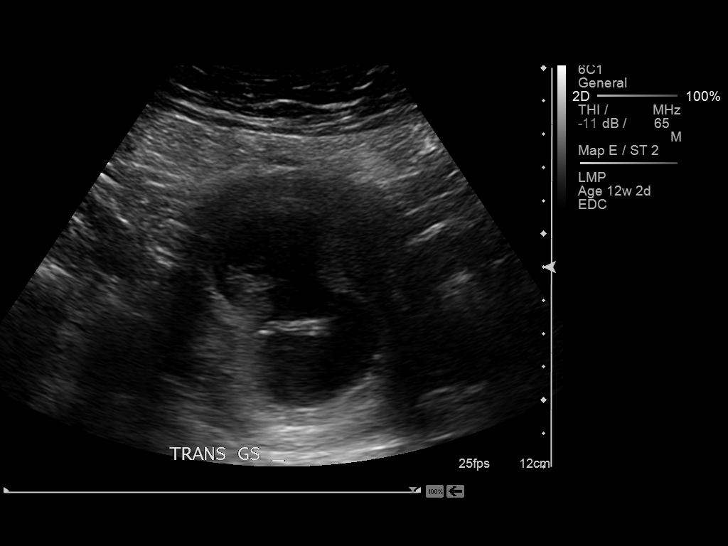
[im 13/27]
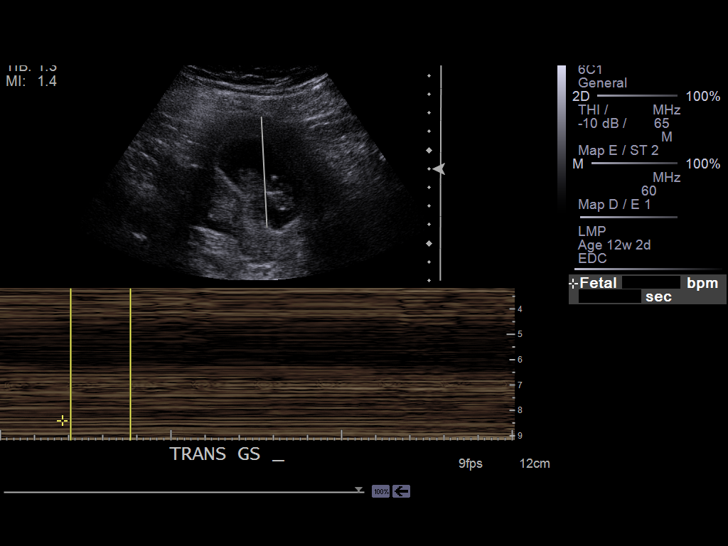
[im 15/27]
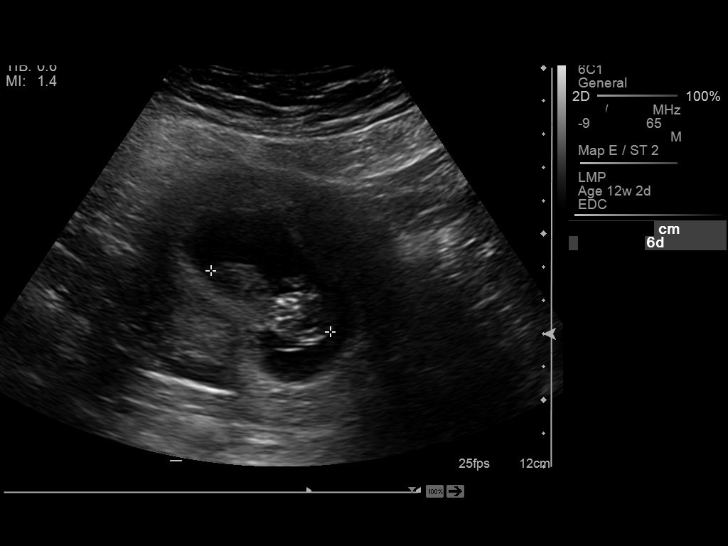
[im 17/27]
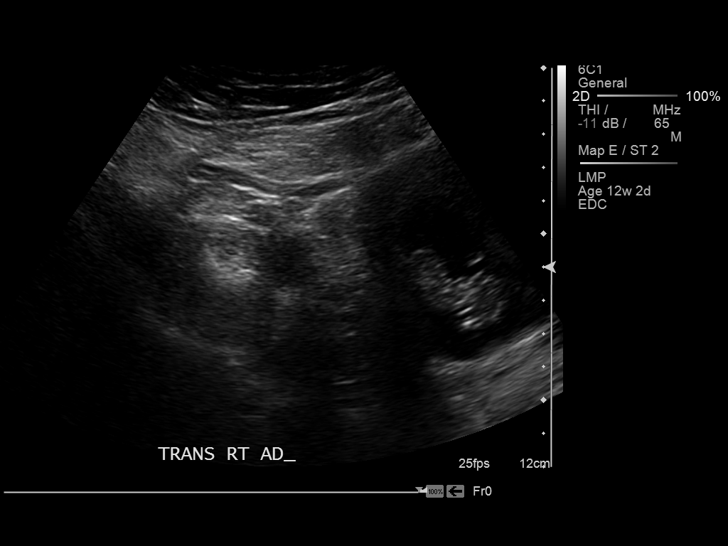
[im 19/27]
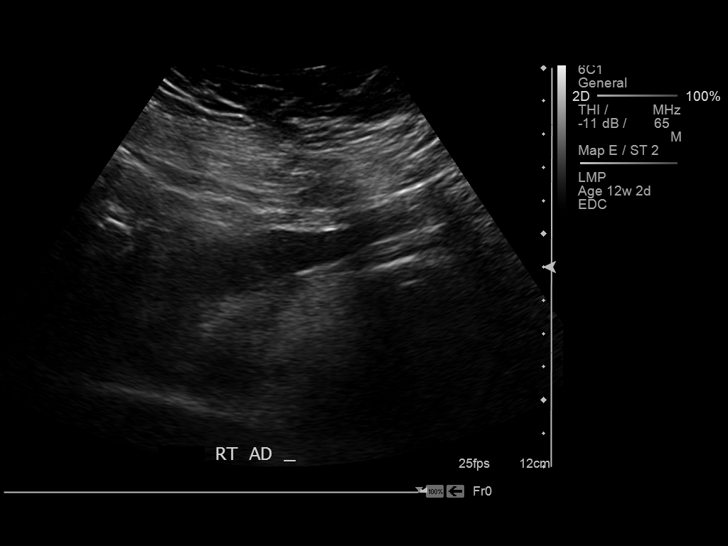
[im 21/27]
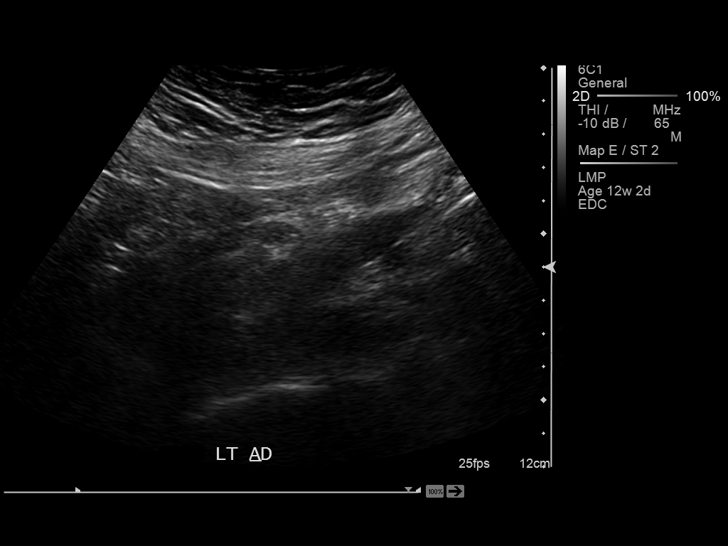
[im 23/27]
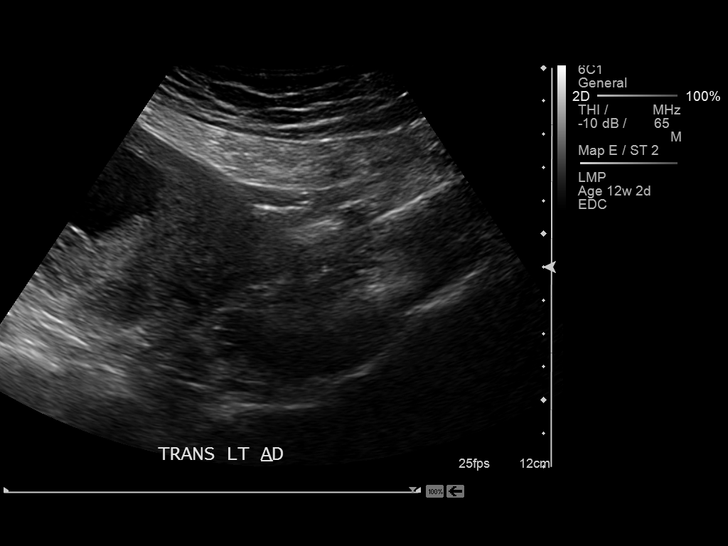
[im 25/27]
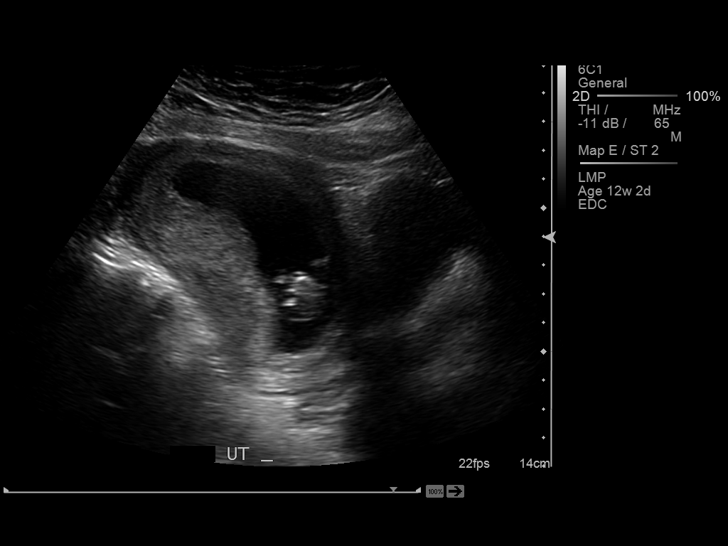
[im 27/27]
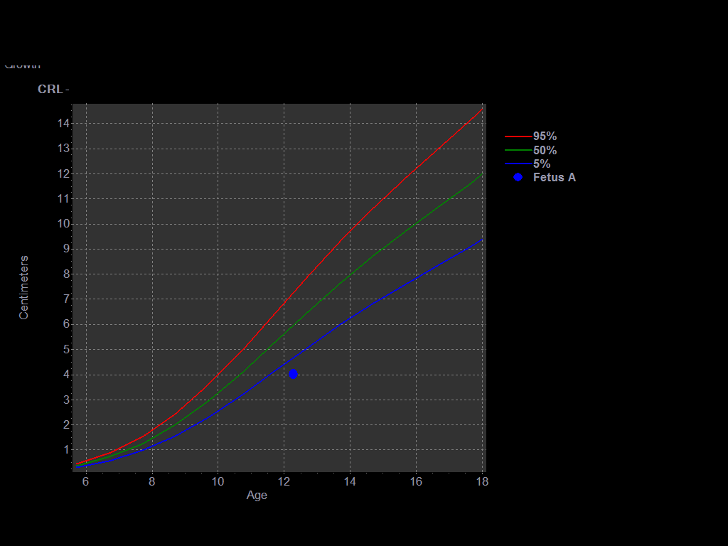

[14 of 27 positions shown; findings below may reference images not displayed]

FINDINGS: Intrauterine gestational sac: Visualized/normal in shape.

Yolk sac:  Not visualized

Embryo:  Present

Cardiac Activity: Present

Heart Rate: 171 bpm

CRL:   40  mm   10 w 6 d                  US EDC: 09/28/2013

Maternal uterus/adnexae: Ovaries not visualized. No evidence of
subchorionic hemorrhage. No significant free fluid.
IMPRESSION: Intrauterine pregnancy of 10 weeks 6 days with fetal heart rate of
171 beats per min.

## 2015-07-21 ENCOUNTER — Encounter: Payer: Self-pay | Admitting: Unknown Physician Specialty

## 2015-07-21 ENCOUNTER — Ambulatory Visit (INDEPENDENT_AMBULATORY_CARE_PROVIDER_SITE_OTHER): Payer: BLUE CROSS/BLUE SHIELD | Admitting: Unknown Physician Specialty

## 2015-07-21 VITALS — BP 112/74 | HR 80 | Temp 98.0°F | Ht 63.0 in | Wt 262.2 lb

## 2015-07-21 DIAGNOSIS — R5383 Other fatigue: Secondary | ICD-10-CM

## 2015-07-21 DIAGNOSIS — M25471 Effusion, right ankle: Secondary | ICD-10-CM | POA: Insufficient documentation

## 2015-07-21 DIAGNOSIS — Z23 Encounter for immunization: Secondary | ICD-10-CM | POA: Diagnosis not present

## 2015-07-21 DIAGNOSIS — M25472 Effusion, left ankle: Secondary | ICD-10-CM | POA: Diagnosis not present

## 2015-07-21 LAB — BAYER DCA HB A1C WAIVED: HB A1C: 5.5 % (ref <7.0)

## 2015-07-21 MED ORDER — HYDROCHLOROTHIAZIDE 25 MG PO TABS: 25.0000 mg | ORAL_TABLET | Freq: Every day | ORAL | Status: DC

## 2015-07-21 NOTE — Assessment & Plan Note (Signed)
Check TSH and Vit D ?

## 2015-07-21 NOTE — Progress Notes (Signed)
BP 112/74 mmHg  Pulse 80  Temp(Src) 98 F (36.7 C)  Ht 5\' 3"  (1.6 m)  Wt 262 lb 3.2 oz (118.933 kg)  BMI 46.46 kg/m2  SpO2 97%  LMP 07/01/2015 (Exact Date)   Subjective:    Patient ID: Holly Nicholson, female    DOB: 04/11/1985, 30 y.o.   MRN: [id number]  HPI: Holly Nicholson is a 30 y.o. female  Chief Complaint  Patient presents with  . Leg Swelling    pt states she has noticed bilateral leg swelling for the last month, and states swelling is worse at night, especially after working  . "sleep too much"    pt states she has been sleeping too much but is drained when waking up   Swelling Pt states she has had swelling in her feet for about a month.  States it's been going on for a while but worsening in the last month.  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  It is also swollen on her days off.  States her feet hurt and they burn.  States some numbness but not daily.  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  She is thirsty all the time but "does not pee like she needs to."  States she drinks Gatorade, water, and 2 sodas/day.   She does state DM runs in the family.    Relevant past medical, surgical, family and social history reviewed and updated as indicated. Interim medical history since our last visit reviewed. Allergies and medications reviewed and updated.  Review of Systems  Per HPI unless specifically indicated above     Objective:    BP 112/74 mmHg  Pulse 80  Temp(Src) 98 F (36.7 C)  Ht 5\' 3"  (1.6 m)  Wt 262 lb 3.2 oz (118.933 kg)  BMI 46.46 kg/m2  SpO2 97%  LMP 07/01/2015 (Exact Date)  Wt Readings from Last 3 Encounters:  07/21/15 262 lb 3.2 oz (118.933 kg)  01/21/15 258 lb 14.4 oz (117.436 kg)  12/22/14 200 lb (90.719 kg)    Physical Exam  Constitutional: She is oriented to person, place, and time. She appears well-developed and well-nourished. No distress.  HENT:   Head: Normocephalic and atraumatic.  Eyes: Conjunctivae and lids are normal. Right eye exhibits no discharge. Left eye exhibits no discharge. No scleral icterus.  Neck: Normal range of motion. Neck supple. No JVD present. Carotid bruit is not present.  Cardiovascular: Normal rate, regular rhythm and normal heart sounds.   Pulmonary/Chest: Effort normal and breath sounds normal.  Abdominal: Normal appearance. There is no splenomegaly or hepatomegaly.  Musculoskeletal: Normal range of motion.  Neurological: She is alert and oriented to person, place, and time.  Skin: Skin is warm, dry and intact. No rash noted. No pallor.  Psychiatric: She has a normal mood and affect. Her behavior is normal. Judgment and thought content normal.   Trace edema bilateral ankles.  Tender to palpation.    Results for orders placed or performed during the hospital encounter of 12/22/14  Urinalysis complete, with microscopic- may I&O cath if menses [facility] only)  Result Value Ref Range   Color, Urine YELLOW (A) YELLOW   APPearance CLOUDY (A) CLEAR   Glucose, UA NEGATIVE NEGATIVE mg/dL   Bilirubin Urine NEGATIVE NEGATIVE   Ketones, ur NEGATIVE NEGATIVE mg/dL   Specific Gravity, Urine 1.021  1.005 - 1.030   Hgb urine dipstick 2+ (A) NEGATIVE   pH 6.0 5.0 - 8.0   Protein, ur 100 (A) NEGATIVE mg/dL   Nitrite NEGATIVE NEGATIVE   Leukocytes, UA 3+ (A) NEGATIVE   RBC / HPF TOO NUMEROUS TO COUNT 0 - 5 RBC/hpf   WBC, UA TOO NUMEROUS TO COUNT 0 - 5 WBC/hpf   Bacteria, UA RARE (A) NONE SEEN   Squamous Epithelial / LPF 0-5 (A) NONE SEEN   Mucous PRESENT   Pregnancy, urine POC  Result Value Ref Range   Preg Test, Ur NEGATIVE NEGATIVE      Assessment & Plan:   Problem List Items Addressed This Visit      Unprioritized   Other fatigue    Check TSH and Vit D      Relevant Orders   TSH   CBC with Differential/Platelet   Swelling of both ankles    Take HCTZ 25 mg QD.  Check CMP and Hgb A1C      Relevant  Orders   TSH   Comprehensive metabolic panel   Bayer DCA Hb U9W Waived    Other Visit Diagnoses    Need for diphtheria-tetanus-pertussis (Tdap) vaccine, adult/adolescent    -  Primary    Relevant Orders    Tdap vaccine greater than or equal to 7yo IM (Completed)        Follow up plan: Return in about 2 weeks (around 08/04/2015).

## 2015-07-21 NOTE — Assessment & Plan Note (Signed)
Take HCTZ 25 mg QD.  Check CMP and Hgb A1C

## 2015-07-21 NOTE — Patient Instructions (Signed)
Tdap Vaccine (Tetanus, Diphtheria and Pertussis): What You Need to Know 1. Why get vaccinated? Tetanus, diphtheria and pertussis are very serious diseases. Tdap vaccine can protect us from these diseases. And, Tdap vaccine given to pregnant women can protect newborn babies against pertussis. TETANUS (Lockjaw) is rare in the [place] today. It causes painful muscle tightening and stiffness, usually all over the body.  It can lead to tightening of muscles in the head and neck so you can't open your mouth, swallow, or sometimes even breathe. Tetanus kills about 1 out of 10 people who are infected even after receiving the best medical care. DIPHTHERIA is also rare in the [place] today. It can cause a thick coating to form in the back of the throat.  It can lead to breathing problems, heart failure, paralysis, and death. PERTUSSIS (Whooping Cough) causes severe coughing spells, which can cause difficulty breathing, vomiting and disturbed sleep.  It can also lead to weight loss, incontinence, and rib fractures. Up to 2 in 100 adolescents and 5 in 100 adults with pertussis are hospitalized or have complications, which could include pneumonia or death. These diseases are caused by bacteria. Diphtheria and pertussis are spread from person to person through secretions from coughing or sneezing. Tetanus enters the body through cuts, scratches, or wounds. Before vaccines, as many as 200,000 cases of diphtheria, 200,000 cases of pertussis, and hundreds of cases of tetanus, were reported in the [place] each year. Since vaccination began, reports of cases for tetanus and diphtheria have dropped by about 99% and for pertussis by about 80%. 2. Tdap vaccine Tdap vaccine can protect adolescents and adults from tetanus, diphtheria, and pertussis. One dose of Tdap is routinely given at age 11 or 12. People who did not get Tdap at that age should get it as soon as possible. Tdap is especially important  for healthcare professionals and anyone having close contact with a baby younger than 12 months. Pregnant women should get a dose of Tdap during every pregnancy, to protect the newborn from pertussis. Infants are most at risk for severe, life-threatening complications from pertussis. Another vaccine, called Td, protects against tetanus and diphtheria, but not pertussis. A Td booster should be given every 10 years. Tdap may be given as one of these boosters if you have never gotten Tdap before. Tdap may also be given after a severe cut or burn to prevent tetanus infection. Your doctor or the person giving you the vaccine can give you more information. Tdap may safely be given at the same time as other vaccines. 3. Some people should not get this vaccine  A person who has ever had a life-threatening allergic reaction after a previous dose of any diphtheria, tetanus or pertussis containing vaccine, OR has a severe allergy to any part of this vaccine, should not get Tdap vaccine. Tell the person giving the vaccine about any severe allergies.  Anyone who had coma or long repeated seizures within 7 days after a childhood dose of DTP or DTaP, or a previous dose of Tdap, should not get Tdap, unless a cause other than the vaccine was found. They can still get Td.  Talk to your doctor if you:  have seizures or another nervous system problem,  had severe pain or swelling after any vaccine containing diphtheria, tetanus or pertussis,  ever had a condition called Guillain-Barr Syndrome (GBS),  aren't feeling well on the day the shot is scheduled. 4. Risks With any medicine, including vaccines, there is   a chance of side effects. These are usually mild and go away on their own. Serious reactions are also possible but are rare. Most people who get Tdap vaccine do not have any problems with it. Mild problems following Tdap (Did not interfere with activities)  Pain where the shot was given (about 3 in 4  adolescents or 2 in 3 adults)  Redness or swelling where the shot was given (about 1 person in 5)  Mild fever of at least 100.4F (up to about 1 in 25 adolescents or 1 in 100 adults)  Headache (about 3 or 4 people in 10)  Tiredness (about 1 person in 3 or 4)  Nausea, vomiting, diarrhea, stomach ache (up to 1 in 4 adolescents or 1 in 10 adults)  Chills, sore joints (about 1 person in 10)  Body aches (about 1 person in 3 or 4)  Rash, swollen glands (uncommon) Moderate problems following Tdap (Interfered with activities, but did not require medical attention)  Pain where the shot was given (up to 1 in 5 or 6)  Redness or swelling where the shot was given (up to about 1 in 16 adolescents or 1 in 12 adults)  Fever over 102F (about 1 in 100 adolescents or 1 in 250 adults)  Headache (about 1 in 7 adolescents or 1 in 10 adults)  Nausea, vomiting, diarrhea, stomach ache (up to 1 or 3 people in 100)  Swelling of the entire arm where the shot was given (up to about 1 in 500). Severe problems following Tdap (Unable to perform usual activities; required medical attention)  Swelling, severe pain, bleeding and redness in the arm where the shot was given (rare). Problems that could happen after any vaccine:  People sometimes faint after a medical procedure, including vaccination. Sitting or lying down for about 15 minutes can help prevent fainting, and injuries caused by a fall. Tell your doctor if you feel dizzy, or have vision changes or ringing in the ears.  Some people get severe pain in the shoulder and have difficulty moving the arm where a shot was given. This happens very rarely.  Any medication can cause a severe allergic reaction. Such reactions from a vaccine are very rare, estimated at fewer than 1 in a million doses, and would happen within a few minutes to a few hours after the vaccination. As with any medicine, there is a very remote chance of a vaccine causing a serious  injury or death. The safety of vaccines is always being monitored. For more information, visit: www.cdc.gov/vaccinesafety/ 5. What if there is a serious problem? What should I look for?  Look for anything that concerns you, such as signs of a severe allergic reaction, very high fever, or unusual behavior.  Signs of a severe allergic reaction can include hives, swelling of the face and throat, difficulty breathing, a fast heartbeat, dizziness, and weakness. These would usually start a few minutes to a few hours after the vaccination. What should I do?  If you think it is a severe allergic reaction or other emergency that can't wait, call 9-1-1 or get the person to the nearest hospital. Otherwise, call your doctor.  Afterward, the reaction should be reported to the Vaccine Adverse Event Reporting System (VAERS). Your doctor might file this report, or you can do it yourself through the VAERS web site at www.vaers.hhs.gov, or by calling 1-800-822-7967. VAERS does not give medical advice.  6. The National Vaccine Injury Compensation Program The National Vaccine Injury Compensation Program (  VICP) is a federal program that was created to compensate people who may have been injured by certain vaccines. Persons who believe they may have been injured by a vaccine can learn about the program and about filing a claim by calling 1-800-338-2382 or visiting the VICP website at www.hrsa.gov/vaccinecompensation. There is a time limit to file a claim for compensation. 7. How can I learn more?  Ask your doctor. He or she can give you the vaccine package insert or suggest other sources of information.  Call your local or state health department.  Contact the Centers for Disease Control and Prevention (CDC):  Call 1-800-232-4636 (1-800-CDC-INFO) or  Visit CDC's website at www.cdc.gov/vaccines CDC Tdap Vaccine VIS (03/04/13)   This information is not intended to replace advice given to you by your health care  provider. Make sure you discuss any questions you have with your health care provider.   Document Released: 06/27/2011 Document Revised: 01/16/2014 Document Reviewed: 04/09/2013 Elsevier Interactive Patient Education 2016 [employer].  

## 2015-07-22 ENCOUNTER — Encounter: Payer: Self-pay | Admitting: *Deleted

## 2015-07-22 ENCOUNTER — Emergency Department: Payer: BLUE CROSS/BLUE SHIELD

## 2015-07-22 ENCOUNTER — Emergency Department
Admission: EM | Admit: 2015-07-22 | Discharge: 2015-07-22 | Disposition: A | Discharge status: Home / Self Care | Payer: BLUE CROSS/BLUE SHIELD | Attending: Emergency Medicine | Admitting: Emergency Medicine

## 2015-07-22 DIAGNOSIS — S93601A Unspecified sprain of right foot, initial encounter: Secondary | ICD-10-CM | POA: Diagnosis not present

## 2015-07-22 DIAGNOSIS — F17210 Nicotine dependence, cigarettes, uncomplicated: Secondary | ICD-10-CM | POA: Insufficient documentation

## 2015-07-22 DIAGNOSIS — X501XXA Overexertion from prolonged static or awkward postures, initial encounter: Secondary | ICD-10-CM | POA: Insufficient documentation

## 2015-07-22 DIAGNOSIS — Y990 Civilian activity done for income or pay: Secondary | ICD-10-CM | POA: Diagnosis not present

## 2015-07-22 DIAGNOSIS — Y929 Unspecified place or not applicable: Secondary | ICD-10-CM | POA: Insufficient documentation

## 2015-07-22 DIAGNOSIS — M79671 Pain in right foot: Secondary | ICD-10-CM | POA: Diagnosis present

## 2015-07-22 DIAGNOSIS — Y939 Activity, unspecified: Secondary | ICD-10-CM | POA: Diagnosis not present

## 2015-07-22 LAB — CBC WITH DIFFERENTIAL/PLATELET
BASOS: 0 %
Basophils Absolute: 0 10*3/uL (ref 0.0–0.2)
EOS (ABSOLUTE): 0.1 10*3/uL (ref 0.0–0.4)
EOS: 2 %
HEMATOCRIT: 34.2 % (ref 34.0–46.6)
HEMOGLOBIN: 11.8 g/dL (ref 11.1–15.9)
IMMATURE GRANULOCYTES: 0 %
Immature Grans (Abs): 0 10*3/uL (ref 0.0–0.1)
LYMPHS ABS: 1.1 10*3/uL (ref 0.7–3.1)
Lymphs: 25 %
MCH: 28.9 pg (ref 26.6–33.0)
MCHC: 34.5 g/dL (ref 31.5–35.7)
MCV: 84 fL (ref 79–97)
MONOCYTES: 6 %
Monocytes Absolute: 0.3 10*3/uL (ref 0.1–0.9)
Neutrophils Absolute: 3.1 10*3/uL (ref 1.4–7.0)
Neutrophils: 67 %
Platelets: 149 10*3/uL — ABNORMAL LOW (ref 150–379)
RBC: 4.08 x10E6/uL (ref 3.77–5.28)
RDW: 14.6 % (ref 12.3–15.4)
WBC: 4.6 10*3/uL (ref 3.4–10.8)

## 2015-07-22 LAB — COMPREHENSIVE METABOLIC PANEL
ALBUMIN: 4.1 g/dL (ref 3.5–5.5)
ALT: 23 IU/L (ref 0–32)
AST: 22 IU/L (ref 0–40)
Albumin/Globulin Ratio: 1.8 (ref 1.2–2.2)
Alkaline Phosphatase: 102 IU/L (ref 39–117)
BUN / CREAT RATIO: 10 (ref 9–23)
BUN: 8 mg/dL (ref 6–20)
Bilirubin Total: 0.5 mg/dL (ref 0.0–1.2)
CALCIUM: 8.8 mg/dL (ref 8.7–10.2)
CO2: 26 mmol/L (ref 18–29)
CREATININE: 0.77 mg/dL (ref 0.57–1.00)
Chloride: 102 mmol/L (ref 96–106)
GFR calc Af Amer: 119 mL/min/{1.73_m2} (ref >59)
GFR calc non Af Amer: 103 mL/min/{1.73_m2} (ref >59)
GLOBULIN, TOTAL: 2.3 g/dL (ref 1.5–4.5)
Glucose: 105 mg/dL — ABNORMAL HIGH (ref 65–99)
POTASSIUM: 3.7 mmol/L (ref 3.5–5.2)
SODIUM: 140 mmol/L (ref 134–144)
Total Protein: 6.4 g/dL (ref 6.0–8.5)

## 2015-07-22 LAB — TSH: TSH: 1.81 u[IU]/mL (ref 0.450–4.500)

## 2015-07-22 NOTE — ED Notes (Signed)

## 2015-07-22 NOTE — ED Provider Notes (Signed)
[facility] Emergency Department Provider Note  ____________________________________________  Time seen: Approximately 3:30 AM  I have reviewed the triage vital signs and the nursing notes.   HISTORY  Chief Complaint Foot Pain    HPI Holly Nicholson is a 30 y.o. female who presents for acute onset of severe right foot pain.  She reports that she went to her primary care doctor earlier today and was are going to be referred to a podiatrist because of some chronic pain in her feet.  However, when she was at work today she was walking and reports feeling a popping sensation in the right foot, after which she had severe pain that was worse with weightbearing.  She is able to walk but with a limp.  Movement makes it worse and rest makes it a little bit better.  There is no swelling greater than baseline in the right foot and no bruising or deformity.   Past Medical History  Diagnosis Date  . Tubal pregnancy Aug 2016  . Elevated blood pressure   . Anxiety     Patient Active Problem List   Diagnosis Date Noted  . Swelling of both ankles 07/21/2015  . Morbid obesity (HCC) 09/06/2014  . Vitamin D deficiency 09/04/2014  . Foot pain, right 09/04/2014  . Generalized anxiety disorder 08/20/2014  . Bipolar affective disorder, currently depressed, moderate (HCC) 08/20/2014  . Other fatigue 08/20/2014  . Tobacco abuse 08/20/2014  . Preventative health care 08/20/2014  . Transient elevated blood pressure 08/20/2014  . Medication monitoring encounter 08/20/2014    Past Surgical History  Procedure Laterality Date  . Knee surgery    . Tubal ligation      Current Outpatient Rx  Name  Route  Sig  Dispense  Refill  . ARIPiprazole (ABILIFY) 5 MG tablet   Oral   Take 5 mg by mouth daily.         . cetirizine (ZYRTEC ALLERGY) 10 MG tablet   Oral   Take 10 mg by mouth daily.         . DiphenhydrAMINE HCl (BENADRYL ALLERGY PO)   Oral   Take by mouth daily as  needed. Pt unsure of dose         . hydrochlorothiazide (HYDRODIURIL) 25 MG tablet   Oral   Take 1 tablet (25 mg total) by mouth daily.   90 tablet   3   . promethazine (PHENERGAN) 12.5 MG tablet   Oral   Take 12.5 mg by mouth every 6 (six) hours as needed for nausea or vomiting. Reported on 07/21/2015         . sertraline (ZOLOFT) 100 MG tablet   Oral   Take 100 mg by mouth daily.           Allergies Latex and Percocet  Family History  Problem Relation Age of Onset  . Heart disease Mother   . Hyperlipidemia Mother   . Hypertension Mother   . Diabetes Father   . Heart disease Father   . Hyperlipidemia Father   . Hypertension Father   . Stroke Father   . Cancer Maternal Grandfather     lung  . Cancer Paternal Grandmother     stomach  . Epilepsy Daughter   . Asthma Son   . Stroke Paternal Grandfather     Social History Social History  Substance Use Topics  . Smoking status: Current Every Day Smoker -- 0.25 packs/day    Types: Cigarettes  .  Smokeless tobacco: Never Used  . Alcohol Use: Yes     Comment: occasionally    Review of Systems Constitutional: No fever/chills Cardiovascular: Denies chest pain. Respiratory: Denies shortness of breath. Gastrointestinal: No abdominal pain.  No nausea, no vomiting.  No diarrhea.  No constipation. Genitourinary: Negative for dysuria. Musculoskeletal: Negative for back pain. Skin: Negative for rash. Neurological: Negative for headaches, focal weakness or numbness.   ____________________________________________   PHYSICAL EXAM:  VITAL SIGNS: ED Triage Vitals  Enc Vitals Group     BP 07/22/15 0036 108/81 mmHg     Pulse Rate 07/22/15 0036 85     Resp 07/22/15 0036 18     Temp 07/22/15 0036 98.2 F (36.8 C)     Temp Source 07/22/15 0036 Oral     SpO2 07/22/15 0036 100 %     Weight 07/22/15 0036 250 lb (113.399 kg)     Height 07/22/15 0036 5\' 3"  (1.6 m)     Head Cir --      Peak Flow --      Pain Score  07/22/15 0037 10     Pain Loc --      Pain Edu? --      Excl. in GC? --     Constitutional: Alert and oriented. Well appearing and in no acute distress. Eyes: Conjunctivae are normal. PERRL. EOMI. Head: Atraumatic. Neck: No stridor.  No meningeal signs.   Cardiovascular: Normal rate, regular rhythm. Good peripheral circulation.  Respiratory: Normal respiratory effort.  No retractions.  Gastrointestinal: Soft and nontender. No distention.  Musculoskeletal: Bilateral pitting edema in lower extremities.  The right foot does not appear any different than the left.  There is no evidence of traumatic injury.  There is tenderness to palpation throughout the foot without any specific focality.  Neurovascularly intact distally.  No wounds on the bottom of the foot.  No evidence of infection. Neurologic:  Normal speech and language. No gross focal neurologic deficits are appreciated.  Skin:  Skin is warm, dry and intact. No rash noted. Psychiatric: Mood and affect are normal. Speech and behavior are normal.  ____________________________________________   LABS (all labs ordered are listed, but only abnormal results are displayed)  Labs Reviewed - No data to display ____________________________________________  EKG  None ____________________________________________  RADIOLOGY   Dg Foot Complete Right  07/22/2015  CLINICAL DATA:  Right foot pain after working all day on feet. No specific injury. Heard a pop. EXAM: RIGHT FOOT COMPLETE - 3+ VIEW COMPARISON:  09/04/2014 FINDINGS: No evidence of acute fracture or dislocation of the right foot. No focal bone lesion or bone destruction. Small calcaneal spurs. No radiopaque soft tissue foreign bodies. No change since prior study. IMPRESSION: No acute bony abnormalities. Electronically Signed   By: Burman NievesWilliam  Stevens M.D.   On: 07/22/2015 01:16    ____________________________________________   PROCEDURES  Procedure(s) performed:    Procedures   ____________________________________________   INITIAL IMPRESSION / ASSESSMENT AND PLAN / ED COURSE  Pertinent labs & imaging results that were available during my care of the patient were reviewed by me and considered in my medical decision making (see chart for details).  The patient has bilateral pedal edema but there is no obvious deformity in the right foot and no swelling in the right greater than the left.  It is difficult to assess point tenderness because she jumps whenever I touch anywhere on her foot.  She does have normal strength and normal distal pulses.  I believe  she most likely sprained her foot given that the radiographs were unremarkable.  I have no suspicion for a Lisfranc injury given that there was no significant mechanism.  I will give her a hard soled shoe and encouraged her to follow up with podiatry.    I reviewed the patient's prescription history over the last 12 months in the [place] Controlled Substances Database and she has no controlled substance prescriptions over that time period.  I encouraged over-the-counter pain medication including NSAIDs, however, because she has reported allergic reaction to oxycodone and I do not want to cause new problems by prescribing something for which she would be allergic.  ____________________________________________  FINAL CLINICAL IMPRESSION(S) / ED DIAGNOSES  Final diagnoses:  Right foot sprain, initial encounter     MEDICATIONS GIVEN DURING THIS VISIT:  Medications - No data to display   NEW OUTPATIENT MEDICATIONS STARTED DURING THIS VISIT:  New Prescriptions   No medications on file      Note:  This document was prepared using Dragon voice recognition software and may include unintentional dictation errors.   Loleta Rose, MD 07/22/15 [phone]

## 2015-07-22 NOTE — ED Notes (Addendum)
Pt to ED with right foot pain that began today after working all day on feet. Pt denies any injury to foot, states "I don't know I just heard a pop though and its been hurting since" Pt ambulatory to triage with steady gait, limp noted. Vitals stable, NAD noted.

## 2015-07-22 NOTE — Discharge Instructions (Signed)
Foot Sprain A foot sprain is an injury to one of the strong bands of tissue (ligaments) that connect and support the many bones in your feet. The ligament can be stretched too much or it can tear. A tear can be either partial or complete. The severity of the sprain depends on how much of the ligament was damaged or torn. CAUSES A foot sprain is usually caused by suddenly twisting or pivoting your foot. RISK FACTORS This injury is more likely to occur in people who:  Play a sport, such as basketball or football.  Exercise or play a sport without warming up.  Start a new workout or sport.  Suddenly increase how long or hard they exercise or play a sport. SYMPTOMS Symptoms of this condition start soon after an injury and include:  Pain, especially in the arch of the foot.  Bruising.  Swelling.  Inability to walk or use the foot to support body weight. DIAGNOSIS This condition is diagnosed with a medical history and physical exam. You may also have imaging tests, such as:  X-rays to make sure there are no broken bones (fractures).  MRI to see if the ligament has torn. TREATMENT Treatment varies depending on the severity of your sprain. Mild sprains can be treated with rest, ice, compression, and elevation (RICE). If your ligament is overstretched or partially torn, treatment usually involves keeping your foot in a fixed position (immobilization) for a period of time. To help you do this, your health care provider will apply a bandage, splint, or walking boot to keep your foot from moving until it heals. You may also be advised to use crutches or a scooter for a few weeks to avoid bearing weight on your foot while it is healing. If your ligament is fully torn, you may need surgery to reconnect the ligament to the bone. After surgery, a cast or splint will be applied and will need to stay on your foot while it heals. Your health care provider may also suggest exercises or physical therapy  to strengthen your foot. HOME CARE INSTRUCTIONS If You Have a Bandage, Splint, or Walking Boot:  Wear it as directed by your health care provider. Remove it only as directed by your health care provider.  Loosen the bandage, splint, or walking boot if your toes become numb and tingle, or if they turn cold and blue. Bathing  If your health care provider approves bathing and showering, cover the bandage or splint with a watertight plastic bag to protect it from water. Do not let the bandage or splint get wet. Managing Pain, Stiffness, and Swelling   If directed, apply ice to the injured area:  Put ice in a plastic bag.  Place a towel between your skin and the bag.  Leave the ice on for 20 minutes, 2-3 times per day.  Move your toes often to avoid stiffness and to lessen swelling.  Raise (elevate) the injured area above the level of your heart while you are sitting or lying down. Driving  Do not drive or operate heavy machinery while taking pain medicine.  Do not drive while wearing a bandage, splint, or walking boot on a foot that you use for driving. Activity  Rest as directed by your health care provider.  Do not use the injured foot to support your body weight until your health care provider says that you can. Use crutches or other supportive devices as directed by your health care provider.  Ask your health care  provider what activities are safe for you. Gradually increase how much and how far you walk until your health care provider says it is safe to return to full activity.  Do any exercise or physical therapy as directed by your health care provider. General Instructions  If a splint was applied, do not put pressure on any part of it until it is fully hardened. This may take several hours.  Take medicines only as directed by your health care provider. These include over-the-counter medicines and prescription medicines.  Keep all follow-up visits as directed by your  health care provider. This is important.  When you can walk without pain, wear supportive shoes that have stiff soles. Do not wear flip-flops, and do not walk barefoot. SEEK MEDICAL CARE IF:  Your pain is not controlled with medicine.  Your bruising or swelling gets worse or does not get better with treatment.  Your splint or walking boot is damaged. SEEK IMMEDIATE MEDICAL CARE IF:  Your foot is numb or blue.  Your foot feels colder than normal.   This information is not intended to replace advice given to you by your health care provider. Make sure you discuss any questions you have with your health care provider.   Document Released: 06/17/2001 Document Revised: 05/12/2014 Document Reviewed: 10/29/2013 Elsevier Interactive Patient Education 2016 [employer]  Cryotherapy Cryotherapy means treatment with cold. Ice or gel packs can be used to reduce both pain and swelling. Ice is the most helpful within the first 24 to 48 hours after an injury or flare-up from overusing a muscle or joint. Sprains, strains, spasms, burning pain, shooting pain, and aches can all be eased with ice. Ice can also be used when recovering from surgery. Ice is effective, has very few side effects, and is safe for most people to use. PRECAUTIONS  Ice is not a safe treatment option for people with:  Raynaud phenomenon. This is a condition affecting small blood vessels in the extremities. Exposure to cold may cause your problems to return.  Cold hypersensitivity. There are many forms of cold hypersensitivity, including:  Cold urticaria. Red, itchy hives appear on the skin when the tissues begin to warm after being iced.  Cold erythema. This is a red, itchy rash caused by exposure to cold.  Cold hemoglobinuria. Red blood cells break down when the tissues begin to warm after being iced. The hemoglobin that carry oxygen are passed into the urine because they cannot combine with blood proteins fast  enough.  Numbness or altered sensitivity in the area being iced. If you have any of the following conditions, do not use ice until you have discussed cryotherapy with your caregiver:  Heart conditions, such as arrhythmia, angina, or chronic heart disease.  High blood pressure.  Healing wounds or open skin in the area being iced.  Current infections.  Rheumatoid arthritis.  Poor circulation.  Diabetes. Ice slows the blood flow in the region it is applied. This is beneficial when trying to stop inflamed tissues from spreading irritating chemicals to surrounding tissues. However, if you expose your skin to cold temperatures for too long or without the proper protection, you can damage your skin or nerves. Watch for signs of skin damage due to cold. HOME CARE INSTRUCTIONS Follow these tips to use ice and cold packs safely.  Place a dry or damp towel between the ice and skin. A damp towel will cool the skin more quickly, so you may need to shorten the time that the  ice is used.  For a more rapid response, add gentle compression to the ice.  Ice for no more than 10 to 20 minutes at a time. The bonier the area you are icing, the less time it will take to get the benefits of ice.  Check your skin after 5 minutes to make sure there are no signs of a poor response to cold or skin damage.  Rest 20 minutes or more between uses.  Once your skin is numb, you can end your treatment. You can test numbness by very lightly touching your skin. The touch should be so light that you do not see the skin dimple from the pressure of your fingertip. When using ice, most people will feel these normal sensations in this order: cold, burning, aching, and numbness.  Do not use ice on someone who cannot communicate their responses to pain, such as small children or people with dementia. HOW TO MAKE AN ICE PACK Ice packs are the most common way to use ice therapy. Other methods include ice massage, ice baths,  and cryosprays. Muscle creams that cause a cold, tingly feeling do not offer the same benefits that ice offers and should not be used as a substitute unless recommended by your caregiver. To make an ice pack, do one of the following:  Place crushed ice or a bag of frozen vegetables in a sealable plastic bag. Squeeze out the excess air. Place this bag inside another plastic bag. Slide the bag into a pillowcase or place a damp towel between your skin and the bag.  Mix 3 parts water with 1 part rubbing alcohol. Freeze the mixture in a sealable plastic bag. When you remove the mixture from the freezer, it will be slushy. Squeeze out the excess air. Place this bag inside another plastic bag. Slide the bag into a pillowcase or place a damp towel between your skin and the bag. SEEK MEDICAL CARE IF:  You develop white spots on your skin. This may give the skin a blotchy (mottled) appearance.  Your skin turns blue or pale.  Your skin becomes waxy or hard.  Your swelling gets worse. MAKE SURE YOU:   Understand these instructions.  Will watch your condition.  Will get help right away if you are not doing well or get worse.   This information is not intended to replace advice given to you by your health care provider. Make sure you discuss any questions you have with your health care provider.   Document Released: 08/22/2010 Document Revised: 01/16/2014 Document Reviewed: 08/22/2010 Elsevier Interactive Patient Education [employer]

## 2015-08-04 ENCOUNTER — Ambulatory Visit: Payer: BLUE CROSS/BLUE SHIELD | Admitting: Unknown Physician Specialty

## 2015-08-06 ENCOUNTER — Encounter: Payer: Self-pay | Admitting: Unknown Physician Specialty

## 2015-08-06 ENCOUNTER — Ambulatory Visit (INDEPENDENT_AMBULATORY_CARE_PROVIDER_SITE_OTHER): Payer: BLUE CROSS/BLUE SHIELD | Admitting: Unknown Physician Specialty

## 2015-08-06 VITALS — BP 109/75 | HR 77 | Temp 98.0°F | Ht 63.0 in | Wt 256.0 lb

## 2015-08-06 DIAGNOSIS — R609 Edema, unspecified: Secondary | ICD-10-CM

## 2015-08-06 DIAGNOSIS — M25471 Effusion, right ankle: Secondary | ICD-10-CM

## 2015-08-06 DIAGNOSIS — M25472 Effusion, left ankle: Secondary | ICD-10-CM | POA: Diagnosis not present

## 2015-08-06 MED ORDER — POTASSIUM CHLORIDE ER 10 MEQ PO TBCR: 10.0000 meq | EXTENDED_RELEASE_TABLET | Freq: Every day | ORAL | 1 refills | Status: DC

## 2015-08-06 MED ORDER — FUROSEMIDE 20 MG PO TABS: 20.0000 mg | ORAL_TABLET | Freq: Every day | ORAL | 1 refills | Status: DC

## 2015-08-06 NOTE — Progress Notes (Signed)
BP 109/75 (BP Location: Left Arm, Patient Position: Sitting, Cuff Size: Large)   Pulse 77   Temp 98 F (36.7 C)   Ht  (1.6 m)   Wt 256 lb (116.1 kg)   LMP 08/05/2015   SpO2 98%   BMI 45.35 kg/m    Subjective:    Patient ID: Holly Nicholson, female    DOB: June 17, 1985, 30 y.o.   MRN: [id number]  HPI: Holly Nicholson is a 30 y.o. female  Chief Complaint  Patient presents with  . Edema   Edema Pt feels the the HCTZ is helping but thinks we should "go up" on the dose.  States it is worse with walking around and particularly after being on shift all day  Fatigue This seems to be better.    Foot pain Went to the ER.  Seems to be improved.    Relevant past medical, surgical, family and social history reviewed and updated as indicated. Interim medical history since our last visit reviewed. Allergies and medications reviewed and updated.  Review of Systems  Per HPI unless specifically indicated above     Objective:    BP 109/75 (BP Location: Left Arm, Patient Position: Sitting, Cuff Size: Large)   Pulse 77   Temp 98 F (36.7 C)   Ht  (1.6 m)   Wt 256 lb (116.1 kg)   LMP 08/05/2015   SpO2 98%   BMI 45.35 kg/m   Wt Readings from Last 3 Encounters:  08/06/15 256 lb (116.1 kg)  07/22/15 250 lb (113.4 kg)  07/21/15 262 lb 3.2 oz (118.9 kg)    Physical Exam  Constitutional: She is oriented to person, place, and time. She appears well-developed and well-nourished. No distress.  HENT:  Head: Normocephalic and atraumatic.  Eyes: Conjunctivae and lids are normal. Right eye exhibits no discharge. Left eye exhibits no discharge. No scleral icterus.  Neck: Normal range of motion. Neck supple. No JVD present. Carotid bruit is not present.  Cardiovascular: Normal rate, regular rhythm and normal heart sounds.   Pulmonary/Chest: Effort normal and breath sounds normal.  Abdominal: Normal appearance. There is no splenomegaly or hepatomegaly.  Musculoskeletal: Normal range of  motion.  Neurological: She is alert and oriented to person, place, and time.  Skin: Skin is warm, dry and intact. No rash noted. No pallor.  Psychiatric: She has a normal mood and affect. Her behavior is normal. Judgment and thought content normal.   EKG is normal  Results for orders placed or performed in visit on 07/21/15  TSH  Result Value Ref Range   TSH 1.810 0.450 - 4.500 uIU/mL  CBC with Differential/Platelet  Result Value Ref Range   WBC 4.6 3.4 - 10.8 x10E3/uL   RBC 4.08 3.77 - 5.28 x10E6/uL   Hemoglobin 11.8 11.1 - 15.9 g/dL   Hematocrit 78.2 95.6 - 46.6 %   MCV 84 79 - 97 fL   MCH 28.9 26.6 - 33.0 pg   MCHC 34.5 31.5 - 35.7 g/dL   RDW 21.3 08.6 - 57.8 %   Platelets 149 (L) 150 - 379 x10E3/uL   Neutrophils 67 %   Lymphs 25 %   Monocytes 6 %   Eos 2 %   Basos 0 %   Neutrophils Absolute 3.1 1.4 - 7.0 x10E3/uL   Lymphocytes Absolute 1.1 0.7 - 3.1 x10E3/uL   Monocytes Absolute 0.3 0.1 - 0.9 x10E3/uL   EOS (ABSOLUTE) 0.1 0.0 - 0.4 x10E3/uL   Basophils Absolute 0.0 0.0 -  0.2 x10E3/uL   Immature Granulocytes 0 %   Immature Grans (Abs) 0.0 0.0 - 0.1 x10E3/uL  Comprehensive metabolic panel  Result Value Ref Range   Glucose 105 (H) 65 - 99 mg/dL   BUN 8 6 - 20 mg/dL   Creatinine, Ser 4.19 0.57 - 1.00 mg/dL   GFR calc non Af Amer 103 >59 mL/min/1.73   GFR calc Af Amer 119 >59 mL/min/1.73   BUN/Creatinine Ratio 10 9 - 23   Sodium 140 134 - 144 mmol/L   Potassium 3.7 3.5 - 5.2 mmol/L   Chloride 102 96 - 106 mmol/L   CO2 26 18 - 29 mmol/L   Calcium 8.8 8.7 - 10.2 mg/dL   Total Protein 6.4 6.0 - 8.5 g/dL   Albumin 4.1 3.5 - 5.5 g/dL   Globulin, Total 2.3 1.5 - 4.5 g/dL   Albumin/Globulin Ratio 1.8 1.2 - 2.2   Bilirubin Total 0.5 0.0 - 1.2 mg/dL   Alkaline Phosphatase 102 39 - 117 IU/L   AST 22 0 - 40 IU/L   ALT 23 0 - 32 IU/L  Bayer DCA Hb A1c Waived  Result Value Ref Range   Bayer DCA Hb A1c Waived 5.5 <7.0 %      Assessment & Plan:   Problem List Items  Addressed This Visit      Unprioritized   Swelling of both ankles    Pt feels the HCTZ isn't working.  Discussed risks of Furosemide including electrolyte deficiency. KCL written to take along with furosemide.  Check CMP next visit       Other Visit Diagnoses    Swelling    -  Primary   Relevant Orders   EKG 12-Lead (Completed)       Follow up plan: Return in about 2 months (around 10/07/2015).

## 2015-08-06 NOTE — Assessment & Plan Note (Signed)
Pt feels the HCTZ isn't working.  Discussed risks of Furosemide including electrolyte deficiency. KCL written to take along with furosemide.  Check CMP next visit

## 2015-08-31 ENCOUNTER — Encounter: Payer: Self-pay | Admitting: *Deleted

## 2015-08-31 ENCOUNTER — Emergency Department: Payer: BLUE CROSS/BLUE SHIELD

## 2015-08-31 ENCOUNTER — Emergency Department
Admission: EM | Admit: 2015-08-31 | Discharge: 2015-08-31 | Disposition: A | Discharge status: Home / Self Care | Payer: BLUE CROSS/BLUE SHIELD | Attending: Emergency Medicine | Admitting: Emergency Medicine

## 2015-08-31 DIAGNOSIS — K8020 Calculus of gallbladder without cholecystitis without obstruction: Secondary | ICD-10-CM

## 2015-08-31 DIAGNOSIS — R1011 Right upper quadrant pain: Secondary | ICD-10-CM | POA: Diagnosis not present

## 2015-08-31 DIAGNOSIS — F17210 Nicotine dependence, cigarettes, uncomplicated: Secondary | ICD-10-CM | POA: Insufficient documentation

## 2015-08-31 DIAGNOSIS — Z79899 Other long term (current) drug therapy: Secondary | ICD-10-CM | POA: Insufficient documentation

## 2015-08-31 LAB — COMPREHENSIVE METABOLIC PANEL
ALK PHOS: 93 U/L (ref 38–126)
ALT: 20 U/L (ref 14–54)
ANION GAP: 4 — AB (ref 5–15)
AST: 20 U/L (ref 15–41)
Albumin: 3.9 g/dL (ref 3.5–5.0)
BILIRUBIN TOTAL: 0.4 mg/dL (ref 0.3–1.2)
BUN: 10 mg/dL (ref 6–20)
CALCIUM: 8.7 mg/dL — AB (ref 8.9–10.3)
CO2: 28 mmol/L (ref 22–32)
CREATININE: 0.92 mg/dL (ref 0.44–1.00)
Chloride: 104 mmol/L (ref 101–111)
Glucose, Bld: 150 mg/dL — ABNORMAL HIGH (ref 65–99)
Potassium: 3.4 mmol/L — ABNORMAL LOW (ref 3.5–5.1)
SODIUM: 136 mmol/L (ref 135–145)
TOTAL PROTEIN: 6.6 g/dL (ref 6.5–8.1)

## 2015-08-31 LAB — URINALYSIS COMPLETE WITH MICROSCOPIC (ARMC ONLY)
Bacteria, UA: NONE SEEN
Bilirubin Urine: NEGATIVE
GLUCOSE, UA: NEGATIVE mg/dL
Hgb urine dipstick: NEGATIVE
KETONES UR: NEGATIVE mg/dL
NITRITE: NEGATIVE
PROTEIN: NEGATIVE mg/dL
SPECIFIC GRAVITY, URINE: 1.02 (ref 1.005–1.030)
pH: 5 (ref 5.0–8.0)

## 2015-08-31 LAB — CBC
HCT: 37 % (ref 35.0–47.0)
HEMOGLOBIN: 12.9 g/dL (ref 12.0–16.0)
MCH: 30.1 pg (ref 26.0–34.0)
MCHC: 34.9 g/dL (ref 32.0–36.0)
MCV: 86 fL (ref 80.0–100.0)
PLATELETS: 135 10*3/uL — AB (ref 150–440)
RBC: 4.3 MIL/uL (ref 3.80–5.20)
RDW: 15 % — AB (ref 11.5–14.5)
WBC: 5.6 10*3/uL (ref 3.6–11.0)

## 2015-08-31 LAB — POCT PREGNANCY, URINE: Preg Test, Ur: NEGATIVE

## 2015-08-31 LAB — LIPASE, BLOOD: Lipase: 31 U/L (ref 11–51)

## 2015-08-31 MED ORDER — NAPROXEN 500 MG PO TABS: 500.0000 mg | ORAL_TABLET | Freq: Two times a day (BID) | ORAL | 0 refills | Status: DC

## 2015-08-31 MED ORDER — KETOROLAC TROMETHAMINE 60 MG/2ML IM SOLN
15.0000 mg | Freq: Once | INTRAMUSCULAR | Status: AC
  Administered 2015-08-31: 15 mg via INTRAMUSCULAR
  Filled 2015-08-31: qty 2

## 2015-08-31 MED ORDER — ONDANSETRON 4 MG PO TBDP: 4.0000 mg | ORAL_TABLET | Freq: Three times a day (TID) | ORAL | 0 refills | Status: DC | PRN

## 2015-08-31 NOTE — ED Notes (Signed)
Ginger ale, crackers and pb provided

## 2015-08-31 NOTE — ED Notes (Signed)
Pt to US.

## 2015-08-31 NOTE — ED Notes (Signed)
Pt reports abdominal pain for the last two days - nausea present also

## 2015-08-31 NOTE — Discharge Instructions (Signed)
Pain Medicine Instructions °HOW CAN PAIN MEDICINE AFFECT ME? °You were given a prescription for pain medicine. This medicine may make you tired or drowsy and may affect your ability to think clearly. Pain medicine may also affect your ability to drive or perform certain physical activities. It may not be possible to make all of your pain go away, but you should be comfortable enough to move, breathe, and take care of yourself. °HOW OFTEN SHOULD I TAKE PAIN MEDICINE AND HOW MUCH SHOULD I TAKE? °Take pain medicine only as directed by your health care provider and only as needed for pain. °You do not need to take pain medicine if you are not having pain, unless directed by your health care provider. °You can take less than the prescribed dose if you find that a smaller amount of medicine controls your pain. °WHAT RESTRICTIONS DO I HAVE WHILE TAKING PAIN MEDICINE? °Follow these instructions after you start taking pain medicine, while you are taking the medicine, and for 8 hours after you stop taking the medicine: °Do not drive. °Do not operate machinery. °Do not operate power tools. °Do not sign legal documents. °Do not drink alcohol. °Do not take sleeping pills. °Do not supervise children by yourself. °Do not participate in activities that require climbing or being in high places. °Do not enter a body of water--such as a lake, river, ocean, spa, or swimming pool--without an adult nearby who can monitor and help you. °HOW CAN I KEEP OTHERS SAFE WHILE I AM TAKING PAIN MEDICINE? °Store your pain medicine as directed by your health care provider. Make sure that it is placed where children and pets cannot reach it. °Never share your pain medicine with anyone. °Do not save any leftover pills. If you have any leftover pain medicine, get rid of it or destroy it as directed by your health care provider. °WHAT ELSE DO I NEED TO KNOW ABOUT TAKING PAIN MEDICINE? °Use a stool softener if you become constipated from your pain  medicine. Increasing your intake of fruits and vegetables will also help with constipation. °Write down the times when you take your pain medicine. Look at the times before you take your next dose of medicine. It is easy to become confused while on pain medicine. Recording the times helps you to avoid an overdose. °If your pain is severe, do not try to treat it yourself by taking more pills than instructed on your prescription. Contact your health care provider for help. °You may have been prescribed a pain medicine that contains acetaminophen. Do not take any other acetaminophen while taking this medicine. An overdose of acetaminophen can result in severe liver damage. Acetaminophen is found in many over-the-counter (OTC) and prescription medicines. If you are taking any medicines in addition to your pain medicine, check the active ingredients on those medicines to see if acetaminophen is listed. °WHEN SHOULD I CALL MY HEALTH CARE PROVIDER? °Your medicine is not helping to make the pain go away. °You vomit or have diarrhea shortly after taking the medicine. °You develop new pain in areas that did not hurt before. °You have an allergic reaction to your medicine. This may include: °Itchiness. °Swelling. °Dizziness. °Developing a new rash. °WHEN SHOULD I CALL 911 OR GO TO THE EMERGENCY ROOM? °You feel dizzy or you faint. °You are very confused or disoriented. °You repeatedly vomit. °Your skin or lips turn pale or bluish in color. °You have shortness of breath or you are breathing much more slowly than usual. °You have   a severe allergic reaction to your medicine. This includes: °Developing tongue swelling. °Having difficulty breathing. °  °This information is not intended to replace advice given to you by your health care provider. Make sure you discuss any questions you have with your health care provider. °  °Document Released: 04/03/2000 Document Revised: 05/12/2014 Document Reviewed: 10/30/2013 °Elsevier  Interactive Patient Education ©2016 [employer] ° °

## 2015-08-31 NOTE — ED Provider Notes (Signed)
[facility] Emergency Department Provider Note  ____________________________________________  Time seen: Approximately 1:21 PM  I have reviewed the triage vital signs and the nursing notes.   HISTORY  Chief Complaint Abdominal Pain    HPI Holly Nicholson is a 30 y.o. female who complains of right upper quadrant abdominal pain radiating to her back for the last 2 days per his been constant since then, steady. Feels sharp and aching. Slightly worsened by eating, decreased oral intake and last 2 days. Has nausea but no vomiting or diarrhea. No fevers chills or sweats. Also worsened by standing.     Past Medical History:  Diagnosis Date  . Anxiety   . Elevated blood pressure   . Tubal pregnancy Aug 2016     Patient Active Problem List   Diagnosis Date Noted  . Swelling of both ankles 07/21/2015  . Morbid obesity (HCC) 09/06/2014  . Vitamin D deficiency 09/04/2014  . Foot pain, right 09/04/2014  . Generalized anxiety disorder 08/20/2014  . Bipolar affective disorder, currently depressed, moderate (HCC) 08/20/2014  . Other fatigue 08/20/2014  . Tobacco abuse 08/20/2014  . Preventative health care 08/20/2014  . Transient elevated blood pressure 08/20/2014  . Medication monitoring encounter 08/20/2014     Past Surgical History:  Procedure Laterality Date  . KNEE SURGERY    . TUBAL LIGATION       Prior to Admission medications   Medication Sig Start Date End Date Taking? Authorizing Provider  ARIPiprazole (ABILIFY) 5 MG tablet Take 5 mg by mouth daily.   Yes Historical Provider, MD  furosemide (LASIX) 20 MG tablet Take 1 tablet (20 mg total) by mouth daily. 08/06/15  Yes Gabriel Cirriheryl Wicker, NP  potassium chloride (K-DUR) 10 MEQ tablet Take 1 tablet (10 mEq total) by mouth daily. 08/06/15  Yes Gabriel Cirriheryl Wicker, NP  sertraline (ZOLOFT) 100 MG tablet Take 200 mg by mouth daily.    Yes Historical Provider, MD  naproxen (NAPROSYN) 500 MG tablet Take 1 tablet (500  mg total) by mouth 2 (two) times daily with a meal. 08/31/15   Sharman CheekPhillip Renessa Wellnitz, MD  ondansetron (ZOFRAN ODT) 4 MG disintegrating tablet Take 1 tablet (4 mg total) by mouth every 8 (eight) hours as needed for nausea or vomiting. 08/31/15   Sharman CheekPhillip Marcha Licklider, MD     Allergies Latex and Percocet [oxycodone-acetaminophen]   Family History  Problem Relation Age of Onset  . Heart disease Mother   . Hyperlipidemia Mother   . Hypertension Mother   . Diabetes Father   . Heart disease Father   . Hyperlipidemia Father   . Hypertension Father   . Stroke Father   . Cancer Maternal Grandfather     lung  . Cancer Paternal Grandmother     stomach  . Epilepsy Daughter   . Asthma Son   . Stroke Paternal Grandfather     Social History Social History  Substance Use Topics  . Smoking status: Current Every Day Smoker    Packs/day: 0.25    Types: Cigarettes  . Smokeless tobacco: Never Used  . Alcohol use Yes     Comment: occasionally    Review of Systems  Constitutional:   No fever or chills.  ENT:   No sore throat. No rhinorrhea. Cardiovascular:   No chest pain. Respiratory:   No dyspnea or cough. Gastrointestinal:   Abdominal pain as above without vomiting or diarrhea.  Genitourinary:   Negative for dysuria or difficulty urinating. Musculoskeletal:   Negative for focal pain  or swelling Neurological:   Negative for headaches 10-point ROS otherwise negative.  ____________________________________________   PHYSICAL EXAM:  VITAL SIGNS: ED Triage Vitals [08/31/15 0944]  Enc Vitals Group     BP      Pulse      Resp      Temp      Temp src      SpO2      Weight 252 lb (114.3 kg)     Height 5\' 3"  (1.6 m)     Head Circumference      Peak Flow      Pain Score 10     Pain Loc      Pain Edu?      Excl. in GC?     Vital signs reviewed, nursing assessments reviewed.   Constitutional:   Alert and oriented. Well appearing and in no distress. Eyes:   No scleral icterus. No  conjunctival pallor. PERRL. EOMI.  No nystagmus. ENT   Head:   Normocephalic and atraumatic.   Nose:   No congestion/rhinnorhea. No septal hematoma   Mouth/Throat:   MMM, no pharyngeal erythema. No peritonsillar mass.    Neck:   No stridor. No SubQ emphysema. No meningismus. Hematological/Lymphatic/Immunilogical:   No cervical lymphadenopathy. Cardiovascular:   RRR. Symmetric bilateral radial and DP pulses.  No murmurs.  Respiratory:   Normal respiratory effort without tachypnea nor retractions. Breath sounds are clear and equal bilaterally. No wheezes/rales/rhonchi. Gastrointestinal:   Soft with right upper quadrant tenderness. Negative Murphy sign. Non distended. There is no CVA tenderness.  No rebound, rigidity, or guarding. Genitourinary:   deferred Musculoskeletal:   Nontender with normal range of motion in all extremities. No joint effusions.  No lower extremity tenderness.  No edema. Neurologic:   Normal speech and language.  CN 2-10 normal. Motor grossly intact. No gross focal neurologic deficits are appreciated.  Skin:    Skin is warm, dry and intact. No rash noted.  No petechiae, purpura, or bullae.  ____________________________________________    LABS (pertinent positives/negatives) (all labs ordered are listed, but only abnormal results are displayed) Labs Reviewed  COMPREHENSIVE METABOLIC PANEL - Abnormal; Notable for the following:       Result Value   Potassium 3.4 (*)    Glucose, Bld 150 (*)    Calcium 8.7 (*)    Anion gap 4 (*)    All other components within normal limits  CBC - Abnormal; Notable for the following:    RDW 15.0 (*)    Platelets 135 (*)    All other components within normal limits  URINALYSIS COMPLETEWITH MICROSCOPIC ([facility] ONLY) - Abnormal; Notable for the following:    Color, Urine RED (*)    APPearance CLOUDY (*)    Leukocytes, UA TRACE (*)    Squamous Epithelial / LPF 6-30 (*)    All other components within normal limits   LIPASE, BLOOD  POC URINE PREG, ED  POCT PREGNANCY, URINE   ____________________________________________   EKG    ____________________________________________    RADIOLOGY  Ultrasound right upper quadrant reveals multiple mobile gallstones, largest 6 mm. No evidence of cholecystitis.  ____________________________________________   PROCEDURES Procedures  ____________________________________________   INITIAL IMPRESSION / ASSESSMENT AND PLAN / ED COURSE  Pertinent labs & imaging results that were available during my care of the patient were reviewed by me and considered in my medical decision making (see chart for details).  Presents with right upper quadrant pain, tender in that area. Labs unremarkable. Afebrile.  We'll check ultrasound RUQ     Clinical Course  Value Comment By Time  Glucose: NEGATIVE (Reviewed) Sharman CheekPhillip Ulysees Robarts, MD 08/22 1317    ----------------------------------------- 3:27 PM on 08/31/2015 -----------------------------------------  Vitals stable, symptoms controlled. Workup reveals cholelithiasis without cholecystitis. Tolerating oral intake. Discharge follow-up with surgery clinic. ____________________________________________   FINAL CLINICAL IMPRESSION(S) / ED DIAGNOSES  Final diagnoses:  Right upper quadrant pain  Calculus of gallbladder without cholecystitis without obstruction  Biliary colic     Portions of this note were generated with dragon dictation software. Dictation errors may occur despite best attempts at proofreading.    Sharman CheekPhillip Haylee Mcanany, MD 08/31/15 [phone]

## 2015-08-31 NOTE — ED Triage Notes (Signed)
Pt complains of abdominal pain with nausea for the last two days, pt denies vomiting

## 2015-08-31 NOTE — ED Notes (Signed)
Pt reports increased pain with eating/drinking  md informed

## 2015-09-01 ENCOUNTER — Encounter: Payer: Self-pay | Admitting: Emergency Medicine

## 2015-09-01 ENCOUNTER — Observation Stay
Admission: EM | Admit: 2015-09-01 | Discharge: 2015-09-03 | Disposition: A | Discharge status: Home / Self Care | Payer: BLUE CROSS/BLUE SHIELD | Attending: Surgery | Admitting: Surgery

## 2015-09-01 DIAGNOSIS — K8020 Calculus of gallbladder without cholecystitis without obstruction: Secondary | ICD-10-CM | POA: Diagnosis not present

## 2015-09-01 DIAGNOSIS — K8050 Calculus of bile duct without cholangitis or cholecystitis without obstruction: Secondary | ICD-10-CM | POA: Diagnosis present

## 2015-09-01 DIAGNOSIS — Z6841 Body Mass Index (BMI) 40.0 and over, adult: Secondary | ICD-10-CM | POA: Insufficient documentation

## 2015-09-01 DIAGNOSIS — F17210 Nicotine dependence, cigarettes, uncomplicated: Secondary | ICD-10-CM | POA: Diagnosis not present

## 2015-09-01 DIAGNOSIS — F319 Bipolar disorder, unspecified: Secondary | ICD-10-CM | POA: Insufficient documentation

## 2015-09-01 DIAGNOSIS — R112 Nausea with vomiting, unspecified: Secondary | ICD-10-CM

## 2015-09-01 DIAGNOSIS — Z79899 Other long term (current) drug therapy: Secondary | ICD-10-CM | POA: Insufficient documentation

## 2015-09-01 DIAGNOSIS — K8080 Other cholelithiasis without obstruction: Secondary | ICD-10-CM | POA: Diagnosis not present

## 2015-09-01 DIAGNOSIS — F411 Generalized anxiety disorder: Secondary | ICD-10-CM | POA: Diagnosis not present

## 2015-09-01 LAB — URINALYSIS COMPLETE WITH MICROSCOPIC (ARMC ONLY)
Bilirubin Urine: NEGATIVE
GLUCOSE, UA: NEGATIVE mg/dL
HGB URINE DIPSTICK: NEGATIVE
KETONES UR: NEGATIVE mg/dL
NITRITE: NEGATIVE
PROTEIN: NEGATIVE mg/dL
SPECIFIC GRAVITY, URINE: 1.024 (ref 1.005–1.030)
pH: 5 (ref 5.0–8.0)

## 2015-09-01 LAB — COMPREHENSIVE METABOLIC PANEL
ALK PHOS: 80 U/L (ref 38–126)
ALT: 19 U/L (ref 14–54)
ANION GAP: 6 (ref 5–15)
AST: 18 U/L (ref 15–41)
Albumin: 3.9 g/dL (ref 3.5–5.0)
BILIRUBIN TOTAL: 0.4 mg/dL (ref 0.3–1.2)
BUN: 11 mg/dL (ref 6–20)
CALCIUM: 9 mg/dL (ref 8.9–10.3)
CO2: 27 mmol/L (ref 22–32)
CREATININE: 0.84 mg/dL (ref 0.44–1.00)
Chloride: 105 mmol/L (ref 101–111)
Glucose, Bld: 117 mg/dL — ABNORMAL HIGH (ref 65–99)
Potassium: 4 mmol/L (ref 3.5–5.1)
SODIUM: 138 mmol/L (ref 135–145)
TOTAL PROTEIN: 6.7 g/dL (ref 6.5–8.1)

## 2015-09-01 LAB — CBC
HCT: 35.4 % (ref 35.0–47.0)
Hemoglobin: 12.4 g/dL (ref 12.0–16.0)
MCH: 30.1 pg (ref 26.0–34.0)
MCHC: 35 g/dL (ref 32.0–36.0)
MCV: 86.1 fL (ref 80.0–100.0)
PLATELETS: 139 10*3/uL — AB (ref 150–440)
RBC: 4.11 MIL/uL (ref 3.80–5.20)
RDW: 14.4 % (ref 11.5–14.5)
WBC: 6 10*3/uL (ref 3.6–11.0)

## 2015-09-01 LAB — POCT PREGNANCY, URINE: Preg Test, Ur: NEGATIVE

## 2015-09-01 LAB — LIPASE, BLOOD: Lipase: 34 U/L (ref 11–51)

## 2015-09-01 MED ORDER — ARIPIPRAZOLE 5 MG PO TABS
5.0000 mg | ORAL_TABLET | Freq: Every day | ORAL | Status: DC
  Administered 2015-09-03: 5 mg via ORAL
  Filled 2015-09-01 (×2): qty 1

## 2015-09-01 MED ORDER — ONDANSETRON HCL 4 MG PO TABS: 4.0000 mg | ORAL_TABLET | Freq: Four times a day (QID) | ORAL | Status: DC | PRN

## 2015-09-01 MED ORDER — SERTRALINE HCL 100 MG PO TABS
200.0000 mg | ORAL_TABLET | Freq: Every day | ORAL | Status: DC
  Administered 2015-09-03: 200 mg via ORAL
  Filled 2015-09-01: qty 2

## 2015-09-01 MED ORDER — ONDANSETRON HCL 4 MG/2ML IJ SOLN
4.0000 mg | INTRAMUSCULAR | Status: AC
  Administered 2015-09-01: 4 mg via INTRAVENOUS
  Filled 2015-09-01: qty 2

## 2015-09-01 MED ORDER — ONDANSETRON HCL 4 MG/2ML IJ SOLN: 4.0000 mg | Freq: Four times a day (QID) | INTRAMUSCULAR | Status: DC | PRN

## 2015-09-01 MED ORDER — CEFAZOLIN SODIUM-DEXTROSE 2-4 GM/100ML-% IV SOLN
2.0000 g | INTRAVENOUS | Status: AC
  Administered 2015-09-02: 2 g via INTRAVENOUS
  Filled 2015-09-01: qty 100

## 2015-09-01 MED ORDER — LACTATED RINGERS IV SOLN
INTRAVENOUS | Status: DC
  Administered 2015-09-01 – 2015-09-03 (×3): via INTRAVENOUS

## 2015-09-01 MED ORDER — MORPHINE SULFATE (PF) 2 MG/ML IV SOLN
2.0000 mg | INTRAVENOUS | Status: DC | PRN
  Administered 2015-09-02 – 2015-09-03 (×4): 2 mg via INTRAVENOUS
  Filled 2015-09-01 (×4): qty 1

## 2015-09-01 MED ORDER — SODIUM CHLORIDE 0.9 % IV BOLUS (SEPSIS)
1000.0000 mL | INTRAVENOUS | Status: AC
  Administered 2015-09-01: 1000 mL via INTRAVENOUS

## 2015-09-01 MED ORDER — MORPHINE SULFATE (PF) 4 MG/ML IV SOLN
4.0000 mg | Freq: Once | INTRAVENOUS | Status: AC
  Administered 2015-09-01: 4 mg via INTRAVENOUS
  Filled 2015-09-01: qty 1

## 2015-09-01 MED ORDER — HEPARIN SODIUM (PORCINE) 5000 UNIT/ML IJ SOLN
5000.0000 [IU] | Freq: Three times a day (TID) | INTRAMUSCULAR | Status: DC
  Administered 2015-09-02 – 2015-09-03 (×3): 5000 [IU] via SUBCUTANEOUS
  Filled 2015-09-01 (×3): qty 1

## 2015-09-01 NOTE — H&P (Signed)
Holly Nicholson is an 30 y.o. female.    Chief Complaint: Right upper quadrant pain  HPI: This a patient with recurrent and episodic right upper quadrant pain worsening over the last few days. She was in the emergency room yesterday and came back today because her pain is unrelenting and she's had multiple emesis she has been unable to take her medications for pain or for nausea. She denies fevers or chills states that this is been going on for about a month.  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 Anxiety   . Elevated blood pressure   . Tubal pregnancy Aug 2016    Past Surgical History:  Procedure Laterality Date  . KNEE SURGERY    . TUBAL LIGATION      Family History  Problem Relation Age of Onset  . Heart disease Mother   . Hyperlipidemia Mother   . Hypertension Mother   . Diabetes Father   . Heart disease Father   . Hyperlipidemia Father   . Hypertension Father   . Stroke Father   . Cancer Maternal Grandfather     lung  . Cancer Paternal Grandmother     stomach  . Epilepsy Daughter   . Asthma Son   . Stroke Paternal Grandfather    Social History:  reports that she has been smoking Cigarettes.  She has been smoking about 0.25 packs per day. She has never used smokeless tobacco. She reports that she drinks alcohol. She reports that she does not use drugs.  Allergies:  Allergies  Allergen Reactions  . Latex Dermatitis     (Not in a hospital admission)   Review of Systems  Constitutional: Negative for chills, fever and weight loss.  HENT: Negative.   Eyes: Negative.   Respiratory: Negative.   Cardiovascular: Negative.   Gastrointestinal: Positive for abdominal pain, nausea and vomiting. Negative for blood in stool, constipation, diarrhea, heartburn and melena.  Genitourinary: Negative.   Musculoskeletal: Negative.   Skin: Negative.    Neurological: Negative.   Endo/Heme/Allergies: Negative.   Psychiatric/Behavioral: Positive for depression.     Physical Exam:  BP (!) 105/58   Pulse 75   Temp 98.2 F (36.8 C) (Oral)   Resp 18   Ht 5' 2"  (1.575 m)   Wt 252 lb (114.3 kg)   LMP 08/05/2015   SpO2 100%   BMI 46.09 kg/m   Physical Exam  Constitutional: She is oriented to person, place, and time and well-developed, well-nourished, and in no distress. No distress.  Comfortable-appearing morbidly obese female patient  HENT:  Head: Normocephalic and atraumatic.  Eyes: Right eye exhibits no discharge. Left eye exhibits no discharge. No scleral icterus.  Neck: Normal range of motion.  Cardiovascular: Normal rate, regular rhythm and normal heart sounds.   Pulmonary/Chest: Effort normal and breath sounds normal. No respiratory distress. She has no wheezes. She has no rales.  Abdominal: Soft. She exhibits no distension and no mass. There is tenderness. There is no rebound and no guarding.  Minimally questionable Murphy's sign  Musculoskeletal: Normal range of motion. She exhibits no edema or tenderness.  Lymphadenopathy:    She has no cervical adenopathy.  Neurological: She is alert and oriented to person, place, and time.  Skin: Skin is warm and dry. No rash noted. She is not diaphoretic. No erythema.  Tattoos  Psychiatric: Mood and affect  normal.  Vitals reviewed.       Results for orders placed or performed during the hospital encounter of 09/01/15 (from the past 48 hour(s))  Lipase, blood     Status: None   Collection Time: 09/01/15  6:50 PM  Result Value Ref Range   Lipase 34 11 - 51 U/L  Comprehensive metabolic panel     Status: Abnormal   Collection Time: 09/01/15  6:50 PM  Result Value Ref Range   Sodium 138 135 - 145 mmol/L   Potassium 4.0 3.5 - 5.1 mmol/L   Chloride 105 101 - 111 mmol/L   CO2 27 22 - 32 mmol/L   Glucose, Bld 117 (H) 65 - 99 mg/dL   BUN 11 6 - 20 mg/dL   Creatinine, Ser 0.84  0.44 - 1.00 mg/dL   Calcium 9.0 8.9 - 10.3 mg/dL   Total Protein 6.7 6.5 - 8.1 g/dL   Albumin 3.9 3.5 - 5.0 g/dL   AST 18 15 - 41 U/L   ALT 19 14 - 54 U/L   Alkaline Phosphatase 80 38 - 126 U/L   Total Bilirubin 0.4 0.3 - 1.2 mg/dL   GFR calc non Af Amer >60 >60 mL/min   GFR calc Af Amer >60 >60 mL/min    Comment: (NOTE) The eGFR has been calculated using the CKD EPI equation. This calculation has not been validated in all clinical situations. eGFR's persistently <60 mL/min signify possible Chronic Kidney Disease.    Anion gap 6 5 - 15  CBC     Status: Abnormal   Collection Time: 09/01/15  6:50 PM  Result Value Ref Range   WBC 6.0 3.6 - 11.0 K/uL   RBC 4.11 3.80 - 5.20 MIL/uL   Hemoglobin 12.4 12.0 - 16.0 g/dL   HCT 35.4 35.0 - 47.0 %   MCV 86.1 80.0 - 100.0 fL   MCH 30.1 26.0 - 34.0 pg   MCHC 35.0 32.0 - 36.0 g/dL   RDW 14.4 11.5 - 14.5 %   Platelets 139 (L) 150 - 440 K/uL  Urinalysis complete, with microscopic ([facility] only)     Status: Abnormal   Collection Time: 09/01/15  7:50 PM  Result Value Ref Range   Color, Urine YELLOW (A) YELLOW   APPearance CLOUDY (A) CLEAR   Glucose, UA NEGATIVE NEGATIVE mg/dL   Bilirubin Urine NEGATIVE NEGATIVE   Ketones, ur NEGATIVE NEGATIVE mg/dL   Specific Gravity, Urine 1.024 1.005 - 1.030   Hgb urine dipstick NEGATIVE NEGATIVE   pH 5.0 5.0 - 8.0   Protein, ur NEGATIVE NEGATIVE mg/dL   Nitrite NEGATIVE NEGATIVE   Leukocytes, UA 3+ (A) NEGATIVE   RBC / HPF 0-5 0 - 5 RBC/hpf   WBC, UA 6-30 0 - 5 WBC/hpf   Bacteria, UA RARE (A) NONE SEEN   Squamous Epithelial / LPF 6-30 (A) NONE SEEN  Pregnancy, urine POC     Status: None   Collection Time: 09/01/15  7:51 PM  Result Value Ref Range   Preg Test, Ur NEGATIVE NEGATIVE    Comment:        THE SENSITIVITY OF THIS METHODOLOGY IS >24 mIU/mL    US Abdomen Limited Ruq  Result Date: 08/31/2015 CLINICAL DATA:  Right upper quadrant pain and tenderness, 3 days duration with nausea up. EXAM: US  ABDOMEN LIMITED - RIGHT UPPER QUADRANT COMPARISON:  None. FINDINGS: Gallbladder: Mobile gallstones in the gallbladder, largest measuring 6.5 mm. No gallbladder wall thickening or appear cholecystic fluid. Common  bile duct: Diameter: 1.6 mm, normal. Liver: Slightly increased echogenicity suggesting fatty change. No focal lesion or ductal dilatation. Portal vein patent. IMPRESSION: Cholelithiasis without ultrasound evidence of cholecystitis. Electronically Signed   By: Nelson Chimes M.D.   On: 08/31/2015 14:13     Assessment/Plan  This a patient with recurrent biliary colic but now she has intractable vomiting and pain is been going on for 36-48 hours she was in the emergency room yesterday and again today and is been unable to keep anything down including medications. With that in mind M recommending admission to the hospital in spite of the lack of findings of acute cholecystitis. She will require laparoscopic cholecystectomy for control of her symptoms.  I discussed with the emergency room physician these plans.  I discussed with the patient and her family the options of observation and the rationale for admission to the hospital and the risk of bleeding infection recurrence failure to resolve her symptoms conversion to an open procedure bile duct damage or bowel injury she understood and agreed to proceed. Dr. Perrin Maltese would be performing the surgery tomorrow or the next day.  Florene Glen, MD, FACS

## 2015-09-01 NOTE — ED Provider Notes (Signed)
[facility] Emergency Department Provider Note  ____________________________________________   First MD Initiated Contact with Patient 09/01/15 2153     (approximate)  I have reviewed the triage vital signs and the nursing notes.   HISTORY  Chief Complaint Abdominal Pain    HPI Holly Nicholson is a 30 y.o. female who presents for reevaluation of right upper quadrant abdominal pain.  He has been present for 3 days and she was seen in the emergency department yesterday and diagnosed with gallstones but no cholecystitis.  She reports that the pain is not controlled with her oral medication and that she is not able to tolerate any solid food, having vomited 6 times today.  She says that the pain waxes and wanes but is sharp, stabbing, and severe in her right upper quadrant.  Nothing makes it better and eating makes it worse.  She denies fever/chills, chest pain, shortness of breath, dysuria.   Past Medical History:  Diagnosis Date  . Anxiety   . Elevated blood pressure   . Tubal pregnancy Aug 2016    Patient Active Problem List   Diagnosis Date Noted  . Swelling of both ankles 07/21/2015  . Morbid obesity (HCC) 09/06/2014  . Vitamin D deficiency 09/04/2014  . Foot pain, right 09/04/2014  . Generalized anxiety disorder 08/20/2014  . Bipolar affective disorder, currently depressed, moderate (HCC) 08/20/2014  . Other fatigue 08/20/2014  . Tobacco abuse 08/20/2014  . Preventative health care 08/20/2014  . Transient elevated blood pressure 08/20/2014  . Medication monitoring encounter 08/20/2014    Past Surgical History:  Procedure Laterality Date  . KNEE SURGERY    . TUBAL LIGATION      Prior to Admission medications   Medication Sig Start Date End Date Taking? Authorizing Provider  ARIPiprazole (ABILIFY) 5 MG tablet Take 5 mg by mouth daily.    Historical Provider, MD  furosemide (LASIX) 20 MG tablet Take 1 tablet (20 mg total) by mouth daily.  08/06/15   Gabriel Cirriheryl Wicker, NP  naproxen (NAPROSYN) 500 MG tablet Take 1 tablet (500 mg total) by mouth 2 (two) times daily with a meal. 08/31/15   Sharman CheekPhillip Stafford, MD  ondansetron (ZOFRAN ODT) 4 MG disintegrating tablet Take 1 tablet (4 mg total) by mouth every 8 (eight) hours as needed for nausea or vomiting. 08/31/15   Sharman CheekPhillip Stafford, MD  potassium chloride (K-DUR) 10 MEQ tablet Take 1 tablet (10 mEq total) by mouth daily. 08/06/15   Gabriel Cirriheryl Wicker, NP  sertraline (ZOLOFT) 100 MG tablet Take 200 mg by mouth daily.     Historical Provider, MD    Allergies Latex  Family History  Problem Relation Age of Onset  . Heart disease Mother   . Hyperlipidemia Mother   . Hypertension Mother   . Diabetes Father   . Heart disease Father   . Hyperlipidemia Father   . Hypertension Father   . Stroke Father   . Cancer Maternal Grandfather     lung  . Cancer Paternal Grandmother     stomach  . Epilepsy Daughter   . Asthma Son   . Stroke Paternal Grandfather     Social History Social History  Substance Use Topics  . Smoking status: Current Every Day Smoker    Packs/day: 0.25    Types: Cigarettes  . Smokeless tobacco: Never Used  . Alcohol use Yes     Comment: occasionally    Review of Systems Constitutional: No fever/chills Eyes: No visual changes. ENT: No sore  throat. Cardiovascular: Denies chest pain. Respiratory: Denies shortness of breath. Gastrointestinal: +RUQ abdominal pain.  +N/V.  No diarrhea.  No constipation. Genitourinary: Negative for dysuria. Musculoskeletal: Negative for back pain. Skin: Negative for rash. Neurological: Negative for headaches, focal weakness or numbness.  10-point ROS otherwise negative.  ____________________________________________   PHYSICAL EXAM:  VITAL SIGNS: ED Triage Vitals  Enc Vitals Group     BP 09/01/15 1842 136/75     Pulse Rate 09/01/15 1842 71     Resp 09/01/15 1842 18     Temp 09/01/15 1842 98.2 F (36.8 C)     Temp Source  09/01/15 1842 Oral     SpO2 09/01/15 1842 100 %     Weight 09/01/15 1843 252 lb (114.3 kg)     Height 09/01/15 1843 5\' 2"  (1.575 m)     Head Circumference --      Peak Flow --      Pain Score 09/01/15 1843 10     Pain Loc --      Pain Edu? --      Excl. in GC? --     Constitutional: Alert and oriented. Well appearing and in no acute distress Does appear uncomfortable Eyes: Conjunctivae are normal. PERRL. EOMI. Head: Atraumatic. Nose: No congestion/rhinnorhea. Mouth/Throat: Mucous membranes are moist.  Oropharynx non-erythematous. Neck: No stridor.  No meningeal signs.   Cardiovascular: Normal rate, regular rhythm. Good peripheral circulation. Grossly normal heart sounds. Respiratory: Normal respiratory effort.  No retractions. Lungs CTAB. Gastrointestinal: Obese.  Soft with severe right upper quadrant tenderness to palpation and positive Murphy sign.  No rebound and no guarding Musculoskeletal: No lower extremity tenderness nor edema. No gross deformities of extremities. Neurologic:  Normal speech and language. No gross focal neurologic deficits are appreciated.  Skin:  Skin is warm, dry and intact. No rash noted. Psychiatric: Mood and affect are normal. Speech and behavior are normal.  ____________________________________________   LABS (all labs ordered are listed, but only abnormal results are displayed)  Labs Reviewed  COMPREHENSIVE METABOLIC PANEL - Abnormal; Notable for the following:       Result Value   Glucose, Bld 117 (*)    All other components within normal limits  CBC - Abnormal; Notable for the following:    Platelets 139 (*)    All other components within normal limits  URINALYSIS COMPLETEWITH MICROSCOPIC ([facility] ONLY) - Abnormal; Notable for the following:    Color, Urine YELLOW (*)    APPearance CLOUDY (*)    Leukocytes, UA 3+ (*)    Bacteria, UA RARE (*)    Squamous Epithelial / LPF 6-30 (*)    All other components within normal limits  LIPASE, BLOOD    POCT PREGNANCY, URINE   ____________________________________________  EKG  None - EKG not ordered by ED physician ____________________________________________  RADIOLOGY   No results found.  ____________________________________________   PROCEDURES  Procedure(s) performed:   Procedures   Critical Care performed: No ____________________________________________   INITIAL IMPRESSION / ASSESSMENT AND PLAN / ED COURSE  Pertinent labs & imaging results that were available during my care of the patient were reviewed by me and considered in my medical decision making (see chart for details).  The patient failed outpatient management and is continuing to vomit when she tries to eat food.  I called and spoke with on with Dr. Excell Seltzer who said he will admit the patient for a cholecystectomy.  She is otherwise stable.    ____________________________________________  FINAL CLINICAL IMPRESSION(S) /  ED DIAGNOSES  Final diagnoses:  Cholelithiasis without cholecystitis  Nausea and vomiting, vomiting of unspecified type     MEDICATIONS GIVEN DURING THIS VISIT:  Medications  morphine 4 MG/ML injection 4 mg (not administered)  ondansetron (ZOFRAN) injection 4 mg (not administered)  sodium chloride 0.9 % bolus 1,000 mL (not administered)     NEW OUTPATIENT MEDICATIONS STARTED DURING THIS VISIT:  New Prescriptions   No medications on file      Note:  This document was prepared using Dragon voice recognition software and may include unintentional dictation errors.    Loleta Roseory Enna Warwick, MD 09/01/15 2211

## 2015-09-01 NOTE — ED Notes (Signed)
Labs reviewed, urine & POCT added

## 2015-09-01 NOTE — ED Triage Notes (Signed)
Pt was seen here yesterday for RUQ pain. Was not vomiting yesterday but has been vomiting multiple times today with increased pain. Dx with gallstones yesterday.

## 2015-09-02 ENCOUNTER — Encounter: Payer: Self-pay | Admitting: Anesthesiology

## 2015-09-02 ENCOUNTER — Observation Stay: Payer: BLUE CROSS/BLUE SHIELD | Admitting: Anesthesiology

## 2015-09-02 ENCOUNTER — Encounter
Admission: EM | Disposition: A | Discharge status: Home / Self Care | Payer: Self-pay | Source: Home / Self Care | Attending: Emergency Medicine

## 2015-09-02 DIAGNOSIS — K8050 Calculus of bile duct without cholangitis or cholecystitis without obstruction: Secondary | ICD-10-CM | POA: Diagnosis not present

## 2015-09-02 DIAGNOSIS — F418 Other specified anxiety disorders: Secondary | ICD-10-CM | POA: Diagnosis not present

## 2015-09-02 DIAGNOSIS — K8020 Calculus of gallbladder without cholecystitis without obstruction: Secondary | ICD-10-CM | POA: Diagnosis not present

## 2015-09-02 HISTORY — PX: CHOLECYSTECTOMY: SHX55

## 2015-09-02 LAB — COMPREHENSIVE METABOLIC PANEL
ALT: 19 U/L (ref 14–54)
AST: 19 U/L (ref 15–41)
Albumin: 3.7 g/dL (ref 3.5–5.0)
Alkaline Phosphatase: 81 U/L (ref 38–126)
Anion gap: 6 (ref 5–15)
BUN: 10 mg/dL (ref 6–20)
CHLORIDE: 110 mmol/L (ref 101–111)
CO2: 26 mmol/L (ref 22–32)
CREATININE: 0.95 mg/dL (ref 0.44–1.00)
Calcium: 8.6 mg/dL — ABNORMAL LOW (ref 8.9–10.3)
GFR calc Af Amer: >60 mL/min (ref >60)
Glucose, Bld: 92 mg/dL (ref 65–99)
Potassium: 4.1 mmol/L (ref 3.5–5.1)
SODIUM: 142 mmol/L (ref 135–145)
Total Bilirubin: 0.4 mg/dL (ref 0.3–1.2)
Total Protein: 6.4 g/dL — ABNORMAL LOW (ref 6.5–8.1)

## 2015-09-02 LAB — CBC
HEMATOCRIT: 34.7 % — AB (ref 35.0–47.0)
Hemoglobin: 12.2 g/dL (ref 12.0–16.0)
MCH: 30.1 pg (ref 26.0–34.0)
MCHC: 35.2 g/dL (ref 32.0–36.0)
MCV: 85.5 fL (ref 80.0–100.0)
PLATELETS: 131 10*3/uL — AB (ref 150–440)
RBC: 4.06 MIL/uL (ref 3.80–5.20)
RDW: 14.3 % (ref 11.5–14.5)
WBC: 6 10*3/uL (ref 3.6–11.0)

## 2015-09-02 LAB — SURGICAL PCR SCREEN
MRSA, PCR: NEGATIVE
Staphylococcus aureus: NEGATIVE

## 2015-09-02 SURGERY — LAPAROSCOPIC CHOLECYSTECTOMY
Anesthesia: General

## 2015-09-02 MED ORDER — HYDROCODONE-ACETAMINOPHEN 5-300 MG PO TABS: 1.0000 | ORAL_TABLET | ORAL | 0 refills | Status: DC | PRN

## 2015-09-02 MED ORDER — LIDOCAINE HCL (CARDIAC) 20 MG/ML IV SOLN
INTRAVENOUS | Status: DC | PRN
  Administered 2015-09-02: 100 mg via INTRAVENOUS

## 2015-09-02 MED ORDER — ONDANSETRON HCL 4 MG/2ML IJ SOLN
INTRAMUSCULAR | Status: DC | PRN
  Administered 2015-09-02: 4 mg via INTRAVENOUS

## 2015-09-02 MED ORDER — SUGAMMADEX SODIUM 500 MG/5ML IV SOLN
INTRAVENOUS | Status: DC | PRN
  Administered 2015-09-02: 228.6 mg via INTRAVENOUS

## 2015-09-02 MED ORDER — KETOROLAC TROMETHAMINE 30 MG/ML IJ SOLN
INTRAMUSCULAR | Status: DC | PRN
  Administered 2015-09-02: 30 mg via INTRAVENOUS

## 2015-09-02 MED ORDER — FENTANYL CITRATE (PF) 100 MCG/2ML IJ SOLN: 25.0000 ug | INTRAMUSCULAR | Status: DC | PRN

## 2015-09-02 MED ORDER — HYDROCODONE-ACETAMINOPHEN 5-325 MG PO TABS
1.0000 | ORAL_TABLET | ORAL | Status: DC | PRN
  Administered 2015-09-03 (×4): 1 via ORAL
  Filled 2015-09-02 (×4): qty 1

## 2015-09-02 MED ORDER — SODIUM CHLORIDE 0.9 % IR SOLN
Status: DC | PRN
  Administered 2015-09-02: 800 mL

## 2015-09-02 MED ORDER — SERTRALINE HCL 100 MG PO TABS
200.0000 mg | ORAL_TABLET | Freq: Once | ORAL | Status: AC
  Administered 2015-09-02: 200 mg via ORAL
  Filled 2015-09-02: qty 2

## 2015-09-02 MED ORDER — FENTANYL CITRATE (PF) 100 MCG/2ML IJ SOLN
INTRAMUSCULAR | Status: DC | PRN
  Administered 2015-09-02: 100 ug via INTRAVENOUS

## 2015-09-02 MED ORDER — ROCURONIUM BROMIDE 100 MG/10ML IV SOLN
INTRAVENOUS | Status: DC | PRN
  Administered 2015-09-02: 30 mg via INTRAVENOUS

## 2015-09-02 MED ORDER — DEXAMETHASONE SODIUM PHOSPHATE 10 MG/ML IJ SOLN
INTRAMUSCULAR | Status: DC | PRN
  Administered 2015-09-02: 10 mg via INTRAVENOUS

## 2015-09-02 MED ORDER — ARIPIPRAZOLE 2 MG PO TABS
5.0000 mg | ORAL_TABLET | Freq: Once | ORAL | Status: AC
  Administered 2015-09-02: 5 mg via ORAL
  Filled 2015-09-02: qty 3

## 2015-09-02 MED ORDER — GLYCOPYRROLATE 0.2 MG/ML IJ SOLN
INTRAMUSCULAR | Status: DC | PRN
  Administered 2015-09-02: 0.2 mg via INTRAVENOUS

## 2015-09-02 MED ORDER — BUPIVACAINE-EPINEPHRINE 0.25% -1:200000 IJ SOLN
INTRAMUSCULAR | Status: DC | PRN
  Administered 2015-09-02: 30 mL

## 2015-09-02 MED ORDER — MIDAZOLAM HCL 2 MG/2ML IJ SOLN
INTRAMUSCULAR | Status: DC | PRN
  Administered 2015-09-02: 2 mg via INTRAVENOUS

## 2015-09-02 MED ORDER — SUCCINYLCHOLINE CHLORIDE 20 MG/ML IJ SOLN
INTRAMUSCULAR | Status: DC | PRN
  Administered 2015-09-02: 140 mg via INTRAVENOUS

## 2015-09-02 MED ORDER — ONDANSETRON HCL 4 MG/2ML IJ SOLN: 4.0000 mg | Freq: Once | INTRAMUSCULAR | Status: DC | PRN

## 2015-09-02 MED ORDER — PROPOFOL 10 MG/ML IV BOLUS
INTRAVENOUS | Status: DC | PRN
  Administered 2015-09-02: 200 mg via INTRAVENOUS

## 2015-09-02 SURGICAL SUPPLY — 45 items
APPLICATOR COTTON TIP 6IN STRL (MISCELLANEOUS) IMPLANT
APPLIER CLIP 5 13 M/L LIGAMAX5 (MISCELLANEOUS)
BLADE SURG 15 STRL LF DISP TIS (BLADE) ×1 IMPLANT
BLADE SURG 15 STRL SS (BLADE) ×1
CANISTER SUCT 1200ML W/VALVE (MISCELLANEOUS) ×2 IMPLANT
CHLORAPREP W/TINT 26ML (MISCELLANEOUS) ×2 IMPLANT
CHOLANGIOGRAM CATH TAUT (CATHETERS) IMPLANT
CLEANER CAUTERY TIP 5X5 PAD (MISCELLANEOUS) IMPLANT
CLIP APPLIE 5 13 M/L LIGAMAX5 (MISCELLANEOUS) IMPLANT
DECANTER SPIKE VIAL GLASS SM (MISCELLANEOUS) ×2 IMPLANT
DEVICE TROCAR PUNCTURE CLOSURE (ENDOMECHANICALS) IMPLANT
DRAPE C-ARM XRAY 36X54 (DRAPES) IMPLANT
ELECT REM PT RETURN 9FT ADLT (ELECTROSURGICAL) ×2
ELECTRODE REM PT RTRN 9FT ADLT (ELECTROSURGICAL) ×1 IMPLANT
ENDOPOUCH RETRIEVER 10 (MISCELLANEOUS) ×2 IMPLANT
GLOVE BIO SURGEON STRL SZ7 (GLOVE) ×4 IMPLANT
GOWN STRL REUS W/ TWL LRG LVL3 (GOWN DISPOSABLE) ×2 IMPLANT
GOWN STRL REUS W/TWL LRG LVL3 (GOWN DISPOSABLE) ×2
IRRIGATION STRYKERFLOW (MISCELLANEOUS) ×1 IMPLANT
IRRIGATOR STRYKERFLOW (MISCELLANEOUS) ×2
IV CATH ANGIO 12GX3 LT BLUE (NEEDLE) IMPLANT
IV NS 1000ML (IV SOLUTION) ×1
IV NS 1000ML BAXH (IV SOLUTION) ×1 IMPLANT
L-HOOK LAP DISP 36CM (ELECTROSURGICAL)
LHOOK LAP DISP 36CM (ELECTROSURGICAL) IMPLANT
LIQUID BAND (GAUZE/BANDAGES/DRESSINGS) IMPLANT
NEEDLE HYPO 22GX1.5 SAFETY (NEEDLE) ×2 IMPLANT
PACK LAP CHOLECYSTECTOMY (MISCELLANEOUS) ×2 IMPLANT
PAD CLEANER CAUTERY TIP 5X5 (MISCELLANEOUS)
PENCIL ELECTRO HAND CTR (MISCELLANEOUS) IMPLANT
SCISSORS METZENBAUM CVD 33 (INSTRUMENTS) ×2 IMPLANT
SLEEVE ENDOPATH XCEL 5M (ENDOMECHANICALS) ×4 IMPLANT
SOL ANTI-FOG 6CC FOG-OUT (MISCELLANEOUS) IMPLANT
SOL FOG-OUT ANTI-FOG 6CC (MISCELLANEOUS)
STOPCOCK 3 WAY  REPLAC (MISCELLANEOUS) IMPLANT
STRIP CLOSURE SKIN 1/2X4 (GAUZE/BANDAGES/DRESSINGS) ×2 IMPLANT
SUT ETHIBOND 0 MO6 C/R (SUTURE) IMPLANT
SUT MNCRL AB 4-0 PS2 18 (SUTURE) ×2 IMPLANT
SUT VIC AB 0 CT2 27 (SUTURE) IMPLANT
SUT VICRYL 0 AB UR-6 (SUTURE) ×2 IMPLANT
SYR 20CC LL (SYRINGE) ×2 IMPLANT
TROCAR XCEL BLUNT TIP 100MML (ENDOMECHANICALS) ×2 IMPLANT
TROCAR XCEL NON-BLD 5MMX100MML (ENDOMECHANICALS) ×2 IMPLANT
TUBING INSUFFLATOR HI FLOW (MISCELLANEOUS) ×2 IMPLANT
WATER STERILE IRR 1000ML POUR (IV SOLUTION) IMPLANT

## 2015-09-02 NOTE — Op Note (Signed)
Laparoscopic Cholecystectomy  Pre-operative Diagnosis: Biliary colic  Post-operative Diagnosis: Biliary colic  Procedure: Laparoscopic cholecystectomy  Surgeon: Adah Salvageichard E. Excell Seltzerooper, MD FACS  Anesthesia: Gen. with endotracheal tube  Assistant: Surgical tech  Procedure Details  The patient was seen again in the Holding Room. The benefits, complications, treatment options, and expected outcomes were discussed with the patient. The risks of bleeding, infection, recurrence of symptoms, failure to resolve symptoms, bile duct damage, bile duct leak, retained common bile duct stone, bowel injury, any of which could require further surgery and/or ERCP, stent, or papillotomy were reviewed with the patient. The likelihood of improving the patient's symptoms with return to their baseline status is good.  The patient and/or family concurred with the proposed plan, giving informed consent.  The patient was taken to Operating Room, identified as Holly Nicholson and the procedure verified as Laparoscopic Cholecystectomy.  A Time Out was held and the above information confirmed.  Prior to the induction of general anesthesia, antibiotic prophylaxis was administered. VTE prophylaxis was in place. General endotracheal anesthesia was then administered and tolerated well. After the induction, the abdomen was prepped with Chloraprep and draped in the sterile fashion. The patient was positioned in the supine position.  Local anesthetic  was injected into the skin near the umbilicus and an incision made. The Veress needle was placed. Pneumoperitoneum was then created with CO2 and tolerated well without any adverse changes in the patient's vital signs. A 5mm port was placed in the periumbilical position and the abdominal cavity was explored.  Two 5-mm ports were placed in the right upper quadrant and a 12 mm epigastric port was placed all under direct vision. All skin incisions  were infiltrated with a local anesthetic agent  before making the incision and placing the trocars.   The patient was positioned  in reverse Trendelenburg, tilted slightly to the patient's left.  The gallbladder was identified, the fundus grasped and retracted cephalad. Adhesions were lysed bluntly. The infundibulum was grasped and retracted laterally, exposing the peritoneum overlying the triangle of Calot. This was then divided and exposed in a blunt fashion. A critical view of the cystic duct and cystic artery was obtained.  The cystic duct was clearly identified and bluntly dissected.   The cystic artery was doubly clipped and divided. This allowed for good visualization of the cystic duct as it entered the infundibulum. It was doubly clipped and divided. A second branch of the cystic artery was doubly clipped and divided.  The gallbladder was taken from the gallbladder fossa in a retrograde fashion with the electrocautery. The gallbladder was removed and placed in an Endocatch bag. The liver bed was irrigated and inspected. Hemostasis was achieved with the electrocautery. Copious irrigation was utilized and was repeatedly aspirated until clear.  The gallbladder and Endocatch sac were then removed through the epigastric port site.   Inspection of the right upper quadrant was performed. No bleeding, bile duct injury or leak, or bowel injury was noted. Pneumoperitoneum was released.  The epigastric port site was closed with figure-of-eight 0 Vicryl sutures. 4-0 subcuticular Monocryl was used to close the skin. Steristrips and Mastisol and sterile dressings were  applied.  The patient was then extubated and brought to the recovery room in stable condition. Sponge, lap, and needle counts were correct at closure and at the conclusion of the case.   Findings: Chronic Cholecystitis   Estimated Blood Loss: Minimal         Drains: None  Specimens: Gallbladder           Complications: none               Holly Standre E. Burt Knack, MD, FACS

## 2015-09-02 NOTE — Anesthesia Preprocedure Evaluation (Addendum)
Anesthesia Evaluation  Patient identified by MRN, date of birth, ID band Patient awake    Reviewed: Allergy & Precautions, NPO status , Patient's Chart, lab work & pertinent test results, reviewed documented beta blocker date and time   Airway Mallampati: III  TM Distance: >3 FB     Dental  (+) Chipped   Pulmonary Current Smoker,           Cardiovascular      Neuro/Psych PSYCHIATRIC DISORDERS Anxiety Depression Bipolar Disorder    GI/Hepatic   Endo/Other  Morbid obesity  Renal/GU      Musculoskeletal   Abdominal   Peds  Hematology   Anesthesia Other Findings EKG checked and ok.  Reproductive/Obstetrics                            Anesthesia Physical Anesthesia Plan  ASA: III  Anesthesia Plan: General   Post-op Pain Management:    Induction: Intravenous  Airway Management Planned: Oral ETT  Additional Equipment:   Intra-op Plan:   Post-operative Plan:   Informed Consent: I have reviewed the patients History and Physical, chart, labs and discussed the procedure including the risks, benefits and alternatives for the proposed anesthesia with the patient or authorized representative who has indicated his/her understanding and acceptance.     Plan Discussed with: CRNA  Anesthesia Plan Comments:         Anesthesia Quick Evaluation

## 2015-09-02 NOTE — Progress Notes (Signed)
Preoperative Review   Patient is met in the preoperative holding area. The history is reviewed in the chart and with the patient. I personally reviewed the options and rationale as well as the risks of this procedure that have been previously discussed with the patient. All questions asked by the patient and/or family were answered to their satisfaction.  Patient agrees to proceed with this procedure at this time.  Fabianna Keats E Alvan Culpepper M.D. FACS  

## 2015-09-02 NOTE — Anesthesia Procedure Notes (Signed)
Procedure Name: Intubation Performed by: Jatavia Keltner Pre-anesthesia Checklist: Patient identified, Patient being monitored, Timeout performed, Emergency Drugs available and Suction available Patient Re-evaluated:Patient Re-evaluated prior to inductionOxygen Delivery Method: Circle system utilized Preoxygenation: Pre-oxygenation with 100% oxygen Intubation Type: IV induction, Rapid sequence and Cricoid Pressure applied Laryngoscope Size: Mac and 3 Grade View: Grade I Tube type: Oral Tube size: 7.0 mm Number of attempts: 1 Airway Equipment and Method: Stylet Placement Confirmation: ETT inserted through vocal cords under direct vision,  positive ETCO2 and breath sounds checked- equal and bilateral Secured at: 21 cm Tube secured with: Tape Dental Injury: Teeth and Oropharynx as per pre-operative assessment        

## 2015-09-02 NOTE — Transfer of Care (Signed)
Immediate Anesthesia Transfer of Care Note  Patient: Holly AdenLinda Nicholson  Procedure(s) Performed: Procedure(s): LAPAROSCOPIC CHOLECYSTECTOMY (N/A)  Patient Location: PACU  Anesthesia Type:General  Level of Consciousness: awake, alert  and oriented  Airway & Oxygen Therapy: Patient Spontanous Breathing and Patient connected to face mask oxygen  Post-op Assessment: Report given to RN and Post -op Vital signs reviewed and stable  Post vital signs: Reviewed and stable  Last Vitals:  Vitals:   09/02/15 1300 09/02/15 2026  BP: 115/68 121/71  Pulse: 71 73  Resp: 18 18  Temp: 36.7 C 36.7 C    Last Pain:  Vitals:   09/02/15 2128  TempSrc:   PainSc: 2       Patients Stated Pain Goal: 0 (09/02/15 0815)  Complications: No apparent anesthesia complications

## 2015-09-02 NOTE — Discharge Instructions (Signed)
Remove dressing in 24 hours. °May shower in 24 hours. °Leave paper strips in place. °Resume all home medications. °Follow-up with Dr. Shyloh Krinke in 10 days. °

## 2015-09-02 NOTE — Progress Notes (Signed)
I discussed the procedure in detail.  The patient was given [employer] engineered[employer] material.  We discussed the risks and benefits of a laparoscopic cholecystectomy and possible cholangiogram including, but not limited to bleeding, infection, injury to surrounding structures such as the intestine or liver, bile leak, retained gallstones, need to convert to an open procedure, prolonged diarrhea, blood clots such as  DVT, common bile duct injury, anesthesia risks, and possible need for additional procedures.  The likelihood of improvement in symptoms and return to the patient's normal status is good. We discussed the typical post-operative recovery course. She wishes to proceed

## 2015-09-03 ENCOUNTER — Encounter: Payer: Self-pay | Admitting: Surgery

## 2015-09-03 DIAGNOSIS — K8020 Calculus of gallbladder without cholecystitis without obstruction: Secondary | ICD-10-CM | POA: Diagnosis not present

## 2015-09-03 LAB — CBC WITH DIFFERENTIAL/PLATELET
Basophils Absolute: 0 10*3/uL (ref 0–0.1)
Basophils Relative: 0 %
Eosinophils Absolute: 0 10*3/uL (ref 0–0.7)
Eosinophils Relative: 0 %
HEMATOCRIT: 35.8 % (ref 35.0–47.0)
HEMOGLOBIN: 12.6 g/dL (ref 12.0–16.0)
LYMPHS ABS: 0.6 10*3/uL — AB (ref 1.0–3.6)
LYMPHS PCT: 8 %
MCH: 30.2 pg (ref 26.0–34.0)
MCHC: 35.1 g/dL (ref 32.0–36.0)
MCV: 85.8 fL (ref 80.0–100.0)
MONOS PCT: 1 %
Monocytes Absolute: 0.1 10*3/uL — ABNORMAL LOW (ref 0.2–0.9)
NEUTROS ABS: 6.8 10*3/uL — AB (ref 1.4–6.5)
NEUTROS PCT: 91 %
Platelets: 132 10*3/uL — ABNORMAL LOW (ref 150–440)
RBC: 4.17 MIL/uL (ref 3.80–5.20)
RDW: 14.2 % (ref 11.5–14.5)
WBC: 7.5 10*3/uL (ref 3.6–11.0)

## 2015-09-03 LAB — COMPREHENSIVE METABOLIC PANEL
ALBUMIN: 3.9 g/dL (ref 3.5–5.0)
ALK PHOS: 84 U/L (ref 38–126)
ALT: 26 U/L (ref 14–54)
AST: 35 U/L (ref 15–41)
Anion gap: 7 (ref 5–15)
BILIRUBIN TOTAL: 0.6 mg/dL (ref 0.3–1.2)
BUN: 10 mg/dL (ref 6–20)
CO2: 26 mmol/L (ref 22–32)
CREATININE: 0.84 mg/dL (ref 0.44–1.00)
Calcium: 8.9 mg/dL (ref 8.9–10.3)
Chloride: 106 mmol/L (ref 101–111)
GFR calc Af Amer: >60 mL/min (ref >60)
GFR calc non Af Amer: >60 mL/min (ref >60)
GLUCOSE: 162 mg/dL — AB (ref 65–99)
POTASSIUM: 4.3 mmol/L (ref 3.5–5.1)
Sodium: 139 mmol/L (ref 135–145)
TOTAL PROTEIN: 7 g/dL (ref 6.5–8.1)

## 2015-09-03 NOTE — Discharge Planning (Signed)
Patient IVs x2 removed.  Discharge papers given, explained and educated.  Patient given pain med script.  RN assessment and VS revealed stability for DC to home.  Also given pain meds just before DC. Patient walked to front and husband driving home via car.

## 2015-09-03 NOTE — Anesthesia Postprocedure Evaluation (Signed)
Anesthesia Post Note  Patient: Holly Nicholson  Procedure(Nicholson) Performed: Procedure(Nicholson) (LRB): LAPAROSCOPIC CHOLECYSTECTOMY (N/A)  Patient location during evaluation: PACU Anesthesia Type: General Level of consciousness: awake and alert Pain management: pain level controlled Vital Signs Assessment: post-procedure vital signs reviewed and stable Respiratory status: spontaneous breathing, nonlabored ventilation, respiratory function stable and patient connected to nasal cannula oxygen Cardiovascular status: blood pressure returned to baseline and stable Postop Assessment: no signs of nausea or vomiting Anesthetic complications: no    Last Vitals:  Vitals:   09/03/15 0003 09/03/15 0423  BP: (!) 105/59 (!) 103/52  Pulse: 95 82  Resp: 18 20  Temp: 36.4 C 36.7 C    Last Pain:  Vitals:   09/03/15 0423  TempSrc: Oral  PainSc:                  Holly Nicholson

## 2015-09-03 NOTE — Discharge Summary (Signed)
  Patient ID: Lenoard AdenLinda Adams MRN: [id number] DOB/AGE: 32/04/1985 32 y.o.  Admit date: 09/01/2015 Discharge date: 09/03/2015   Discharge Diagnoses:  Active Problems:   Biliary colic   Procedures: Laparoscopic cholecystectomy  Hospital Course: 30 year old obese female admitted for recurrent biliary colic consistent with chronic cholecystitis. She was admitted for IV antibiotics and probably taken for a laparoscopic cholecystectomy that was uneventful. At the time of discharge she was ambulating, her pain was under control, her vital signs were stable and she was afebrile. Her physical exam shows a female in no acute distress. Awake alert. Abdomen: Soft, appropriate incisional tenderness. Dressings were intact without evidence of infection. Extremities: No edema well perfused and warm. Condition of the patient at the time of discharge is stable   Disposition: 01-Home or Self Care  Discharge Instructions    Call MD for:  difficulty breathing, headache or visual disturbances    Complete by:  As directed   Call MD for:  extreme fatigue    Complete by:  As directed   Call MD for:  hives    Complete by:  As directed   Call MD for:  persistant dizziness or light-headedness    Complete by:  As directed   Call MD for:  persistant nausea and vomiting    Complete by:  As directed   Call MD for:  redness, tenderness, or signs of infection (pain, swelling, redness, odor or green/yellow discharge around incision site)    Complete by:  As directed   Call MD for:  severe uncontrolled pain    Complete by:  As directed   Call MD for:  temperature >100.4    Complete by:  As directed   Diet - low sodium heart healthy    Complete by:  As directed   Discharge instructions    Complete by:  As directed   Shower tomorrow   Increase activity slowly    Complete by:  As directed   Lifting restrictions    Complete by:  As directed   20 lbs x 6 weeks   Remove dressing in 24 hours    Complete by:  As directed        Medication List    TAKE these medications   ARIPiprazole 5 MG tablet Commonly known as:  ABILIFY Take 5 mg by mouth daily.   furosemide 20 MG tablet Commonly known as:  LASIX Take 1 tablet (20 mg total) by mouth daily.   Hydrocodone-Acetaminophen 5-300 MG Tabs Commonly known as:  VICODIN Take 1 tablet by mouth every 4 (four) hours as needed.   naproxen 500 MG tablet Commonly known as:  NAPROSYN Take 1 tablet (500 mg total) by mouth 2 (two) times daily with a meal.   ondansetron 4 MG disintegrating tablet Commonly known as:  ZOFRAN ODT Take 1 tablet (4 mg total) by mouth every 8 (eight) hours as needed for nausea or vomiting.   potassium chloride 10 MEQ tablet Commonly known as:  K-DUR Take 1 tablet (10 mEq total) by mouth daily.   sertraline 100 MG tablet Commonly known as:  ZOLOFT Take 200 mg by mouth daily.      Follow-up Information    Dionne Miloichard Cooper, MD Follow up in 10 day(s).   Specialty:  Surgery Contact information: [address] [place] [address] [phone]            Sterling Bigiego Audry Pecina, MD FACS

## 2015-09-06 ENCOUNTER — Ambulatory Visit: Payer: BLUE CROSS/BLUE SHIELD | Admitting: Surgery

## 2015-09-06 LAB — SURGICAL PATHOLOGY

## 2015-09-20 ENCOUNTER — Encounter: Payer: Self-pay | Admitting: Surgery

## 2015-09-20 ENCOUNTER — Ambulatory Visit (INDEPENDENT_AMBULATORY_CARE_PROVIDER_SITE_OTHER): Payer: Medicaid Other | Admitting: Surgery

## 2015-09-20 VITALS — BP 132/85 | HR 86 | Temp 98.0°F | Ht 63.0 in | Wt 266.0 lb

## 2015-09-20 DIAGNOSIS — Z09 Encounter for follow-up examination after completed treatment for conditions other than malignant neoplasm: Secondary | ICD-10-CM

## 2015-09-20 NOTE — Progress Notes (Signed)
S/p lap chole by Dr. Excell Seltzerooper 2 weeks ago  GS and no Malignancy on path Results d/w pt in detail She is doing well Unfortunately her mother died a few days ago She is taking Po but having some loose BM after she eats, this seems to have happened to her Father as well and it subsided  PE NAD Abd: soft, incisions c/d/i, no infection  A/P Doing well from lap chole, anticipate post chole diarrhea will improve  May need to do cholestyramine if sxs worsens. She know to call us back If she has any issues May RTW next week w lifting restrictions

## 2015-09-20 NOTE — Patient Instructions (Signed)
Please call our office if you have questions or concerns. We have provided you a note to return to work .

## 2015-10-08 ENCOUNTER — Ambulatory Visit: Payer: BLUE CROSS/BLUE SHIELD | Admitting: Unknown Physician Specialty

## 2015-10-08 DIAGNOSIS — Z3201 Encounter for pregnancy test, result positive: Secondary | ICD-10-CM | POA: Diagnosis not present

## 2015-10-17 ENCOUNTER — Encounter: Payer: Self-pay | Admitting: Emergency Medicine

## 2015-10-17 ENCOUNTER — Emergency Department: Payer: Medicaid Other

## 2015-10-17 ENCOUNTER — Emergency Department
Admission: EM | Admit: 2015-10-17 | Discharge: 2015-10-17 | Disposition: A | Discharge status: Home / Self Care | Payer: Medicaid Other | Attending: Emergency Medicine | Admitting: Emergency Medicine

## 2015-10-17 DIAGNOSIS — O2341 Unspecified infection of urinary tract in pregnancy, first trimester: Secondary | ICD-10-CM | POA: Insufficient documentation

## 2015-10-17 DIAGNOSIS — N390 Urinary tract infection, site not specified: Secondary | ICD-10-CM | POA: Diagnosis not present

## 2015-10-17 DIAGNOSIS — F17210 Nicotine dependence, cigarettes, uncomplicated: Secondary | ICD-10-CM | POA: Insufficient documentation

## 2015-10-17 DIAGNOSIS — O99331 Smoking (tobacco) complicating pregnancy, first trimester: Secondary | ICD-10-CM | POA: Diagnosis not present

## 2015-10-17 DIAGNOSIS — Z3A01 Less than 8 weeks gestation of pregnancy: Secondary | ICD-10-CM | POA: Insufficient documentation

## 2015-10-17 DIAGNOSIS — R109 Unspecified abdominal pain: Secondary | ICD-10-CM

## 2015-10-17 DIAGNOSIS — O23591 Infection of other part of genital tract in pregnancy, first trimester: Secondary | ICD-10-CM | POA: Diagnosis not present

## 2015-10-17 DIAGNOSIS — O26891 Other specified pregnancy related conditions, first trimester: Secondary | ICD-10-CM | POA: Diagnosis not present

## 2015-10-17 DIAGNOSIS — N760 Acute vaginitis: Secondary | ICD-10-CM

## 2015-10-17 DIAGNOSIS — O0281 Inappropriate change in quantitative human chorionic gonadotropin (hCG) in early pregnancy: Secondary | ICD-10-CM | POA: Diagnosis not present

## 2015-10-17 DIAGNOSIS — B9689 Other specified bacterial agents as the cause of diseases classified elsewhere: Secondary | ICD-10-CM

## 2015-10-17 LAB — COMPREHENSIVE METABOLIC PANEL
ALK PHOS: 81 U/L (ref 38–126)
ALT: 23 U/L (ref 14–54)
ANION GAP: 5 (ref 5–15)
AST: 19 U/L (ref 15–41)
Albumin: 4.1 g/dL (ref 3.5–5.0)
BUN: 8 mg/dL (ref 6–20)
CALCIUM: 9 mg/dL (ref 8.9–10.3)
CO2: 26 mmol/L (ref 22–32)
Chloride: 104 mmol/L (ref 101–111)
Creatinine, Ser: 0.89 mg/dL (ref 0.44–1.00)
GFR calc non Af Amer: >60 mL/min (ref >60)
Glucose, Bld: 122 mg/dL — ABNORMAL HIGH (ref 65–99)
Potassium: 3.9 mmol/L (ref 3.5–5.1)
SODIUM: 135 mmol/L (ref 135–145)
Total Bilirubin: 0.7 mg/dL (ref 0.3–1.2)
Total Protein: 7 g/dL (ref 6.5–8.1)

## 2015-10-17 LAB — URINALYSIS COMPLETE WITH MICROSCOPIC (ARMC ONLY)
Bilirubin Urine: NEGATIVE
Glucose, UA: NEGATIVE mg/dL
HGB URINE DIPSTICK: NEGATIVE
Ketones, ur: NEGATIVE mg/dL
NITRITE: NEGATIVE
PH: 6 (ref 5.0–8.0)
PROTEIN: NEGATIVE mg/dL
SPECIFIC GRAVITY, URINE: 1.018 (ref 1.005–1.030)

## 2015-10-17 LAB — WET PREP, GENITAL
Sperm: NONE SEEN
TRICH WET PREP: NONE SEEN
YEAST WET PREP: NONE SEEN

## 2015-10-17 LAB — HCG, QUANTITATIVE, PREGNANCY: HCG, BETA CHAIN, QUANT, S: 11705 m[IU]/mL — AB (ref <5)

## 2015-10-17 LAB — CHLAMYDIA/NGC RT PCR (ARMC ONLY)
Chlamydia Tr: NOT DETECTED
N gonorrhoeae: NOT DETECTED

## 2015-10-17 LAB — CBC
HCT: 35.9 % (ref 35.0–47.0)
HEMOGLOBIN: 12.7 g/dL (ref 12.0–16.0)
MCH: 30.3 pg (ref 26.0–34.0)
MCHC: 35.2 g/dL (ref 32.0–36.0)
MCV: 85.9 fL (ref 80.0–100.0)
Platelets: 139 10*3/uL — ABNORMAL LOW (ref 150–440)
RBC: 4.18 MIL/uL (ref 3.80–5.20)
RDW: 14.6 % — ABNORMAL HIGH (ref 11.5–14.5)
WBC: 7.3 10*3/uL (ref 3.6–11.0)

## 2015-10-17 MED ORDER — PRENATAL VITAMINS 0.8 MG PO TABS: 1.0000 | ORAL_TABLET | Freq: Every day | ORAL | 0 refills | Status: DC

## 2015-10-17 MED ORDER — CEPHALEXIN 500 MG PO CAPS
500.0000 mg | ORAL_CAPSULE | Freq: Once | ORAL | Status: AC
  Administered 2015-10-17: 500 mg via ORAL
  Filled 2015-10-17: qty 1

## 2015-10-17 MED ORDER — CEPHALEXIN 500 MG PO CAPS: 500.0000 mg | ORAL_CAPSULE | Freq: Two times a day (BID) | ORAL | 0 refills | Status: AC

## 2015-10-17 NOTE — ED Provider Notes (Signed)
[facility] Emergency Department Provider Note   ____________________________________________   First MD Initiated Contact with Patient 10/17/15 [phone]     (approximate)  I have reviewed the triage vital signs and the nursing notes.   HISTORY  Chief Complaint Abdominal Pain   HPI Holly Nicholson is a 30 y.o. female with a history of elevated blood pressure as well as a tubal pregnancy was presenting to the emergency department with 1 day of suprapubic pain at [redacted] weeks gestation. She describes the pain as an 8-9 out of 10. Denies any radiation of pain. Denies any vaginal bleeding or discharge. Denies burning with urination. We'll be following up at [facility] side OB/GYN for her prenatal care. Is not taking prenatal vitamins at this time.   Past Medical History:  Diagnosis Date  . Anxiety   . Elevated blood pressure   . Tubal pregnancy Aug 2016    Patient Active Problem List   Diagnosis Date Noted  . Biliary colic 09/01/2015  . Cholelithiasis without cholecystitis   . Swelling of both ankles 07/21/2015  . Morbid obesity (HCC) 09/06/2014  . Vitamin D deficiency 09/04/2014  . Foot pain, right 09/04/2014  . Generalized anxiety disorder 08/20/2014  . Emesis 08/20/2014  . Bipolar affective disorder, currently depressed, moderate (HCC) 08/20/2014  . Other fatigue 08/20/2014  . Tobacco abuse 08/20/2014  . Preventative health care 08/20/2014  . Transient elevated blood pressure 08/20/2014  . Medication monitoring encounter 08/20/2014  . Pregnancy of unknown anatomic location 08/17/2014  . Pregnancy 04/10/2013    Past Surgical History:  Procedure Laterality Date  . CHOLECYSTECTOMY N/A 09/02/2015   Procedure: LAPAROSCOPIC CHOLECYSTECTOMY;  Surgeon: Leafy Ro, MD;  Location: [facility] ORS;  Service: General;  Laterality: N/A;  . KNEE SURGERY    . TUBAL LIGATION      Prior to Admission medications   Medication Sig Start Date End Date Taking? Authorizing  Provider  ARIPiprazole (ABILIFY) 5 MG tablet Take 5 mg by mouth daily.    Historical Provider, MD  furosemide (LASIX) 20 MG tablet Take 1 tablet (20 mg total) by mouth daily. 08/06/15   Gabriel Cirri, NP  potassium chloride (K-DUR) 10 MEQ tablet Take 1 tablet (10 mEq total) by mouth daily. 08/06/15   Gabriel Cirri, NP  sertraline (ZOLOFT) 100 MG tablet Take 200 mg by mouth daily.     Historical Provider, MD    Allergies Latex  Family History  Problem Relation Age of Onset  . Heart disease Mother   . Hyperlipidemia Mother   . Hypertension Mother   . Diabetes Father   . Heart disease Father   . Hyperlipidemia Father   . Hypertension Father   . Stroke Father   . Cancer Maternal Grandfather     lung  . Cancer Paternal Grandmother     stomach  . Epilepsy Daughter   . Asthma Son   . Stroke Paternal Grandfather     Social History Social History  Substance Use Topics  . Smoking status: Current Every Day Smoker    Packs/day: 0.25    Types: Cigarettes  . Smokeless tobacco: Never Used  . Alcohol use No    Review of Systems Constitutional: No fever/chills Eyes: No visual changes. ENT: No sore throat. Cardiovascular: Denies chest pain. Respiratory: Denies shortness of breath. Gastrointestinal: No nausea, no vomiting.  No diarrhea.  No constipation. Genitourinary: Negative for dysuria. Musculoskeletal: Negative for back pain. Skin: Negative for rash. Neurological: Negative for headaches, focal weakness or  numbness.  10-point ROS otherwise negative.  ____________________________________________   PHYSICAL EXAM:  VITAL SIGNS: ED Triage Vitals  Enc Vitals Group     BP 10/17/15 0346 118/69     Pulse Rate 10/17/15 0346 88     Resp 10/17/15 0346 18     Temp 10/17/15 0346 98.9 F (37.2 C)     Temp Source 10/17/15 0346 Oral     SpO2 10/17/15 0346 100 %     Weight 10/17/15 0347 266 lb 8 oz (120.9 kg)     Height 10/17/15 0347 5\' 2"  (1.575 m)     Head Circumference --       Peak Flow --      Pain Score 10/17/15 0347 9     Pain Loc --      Pain Edu? --      Excl. in GC? --     Constitutional: Alert and oriented. Well appearing and in no acute distress. Eyes: Conjunctivae are normal. PERRL. EOMI. Head: Atraumatic. Nose: No congestion/rhinnorhea. Mouth/Throat: Mucous membranes are moist.  Neck: No stridor.   Cardiovascular: Normal rate, regular rhythm. Grossly normal heart sounds.   Respiratory: Normal respiratory effort.  No retractions. Lungs CTAB. Gastrointestinal: Soft and With mild suprapubic tenderness palpation. No distention.  Genitourinary:  Normal external exam. Speculum exam without any discharge. Bimanual exam with a closed cervical os. No CMT. Uterine tenderness without any adnexal tenderness nor masses. Musculoskeletal: No lower extremity tenderness nor edema.  No joint effusions. Neurologic:  Normal speech and language. No gross focal neurologic deficits are appreciated.  Skin:  Skin is warm, dry and intact. No rash noted. Psychiatric: Mood and affect are normal. Speech and behavior are normal.  ____________________________________________   LABS (all labs ordered are listed, but only abnormal results are displayed)  Labs Reviewed  COMPREHENSIVE METABOLIC PANEL - Abnormal; Notable for the following:       Result Value   Glucose, Bld 122 (*)    All other components within normal limits  CBC - Abnormal; Notable for the following:    RDW 14.6 (*)    Platelets 139 (*)    All other components within normal limits  URINALYSIS COMPLETEWITH MICROSCOPIC ([facility] ONLY) - Abnormal; Notable for the following:    Color, Urine YELLOW (*)    APPearance CLOUDY (*)    Leukocytes, UA 2+ (*)    Bacteria, UA RARE (*)    Squamous Epithelial / LPF TOO NUMEROUS TO COUNT (*)    All other components within normal limits  HCG, QUANTITATIVE, PREGNANCY - Abnormal; Notable for the following:    hCG, Beta Chain, Quant, S 11,705 (*)    All other components  within normal limits  WET PREP, GENITAL  CHLAMYDIA/NGC RT PCR ([facility] ONLY)  URINE CULTURE   ____________________________________________  EKG   ____________________________________________  RADIOLOGY  US OB Comp Less 14 Wks (Accession [id number]) (Order [id number])  Imaging  Date: 10/17/2015 Department[facility] EM[facility]GENC[facility]PARTMENT Released By/Authorizing: Myrna Blazeravid Matthew Schaevitz, MD (auto-released)  Exam Information   Status Exam Begun  Exam Ended   Final [99] 10/17/2015 6:12 AM 10/17/2015 7:00 AM  PACS Images   Show images for US OB Comp Less 14 Wks  Study Result   CLINICAL DATA:  One day history of suprapubic region pain  EXAM: OBSTETRIC <14 WK US AND TRANSVAGINAL OB US  TECHNIQUE: Both transabdominal and transvaginal ultrasound examinations were performed for complete evaluation of the gestation as well as the maternal uterus, adnexal regions,  and pelvic cul-de-sac. Transvaginal technique was performed to assess early pregnancy.  COMPARISON:  None.  FINDINGS: Intrauterine gestational sac: Visualized  Yolk sac:  Visualized  Embryo:  Visualized  Cardiac Activity: Visualized  Heart Rate: 124  bpm  CRL:  4  mm   6 w   1 d                  [place] EDC: June 10, 2016  Subchorionic hemorrhage:  None visualized.  Maternal uterus/adnexae: Cervical os is closed. No intrauterine mass. No extrauterine pelvic or adnexal mass or inflammatory focus. No free pelvic fluid.  IMPRESSION: Single live intrauterine gestation with estimated gestational age of approximately 6 weeks. No subchorionic hemorrhage. No extrauterine mass, fluid, or inflammatory appearing focus.   Electronically Signed   By: Bretta Bang III M.D.   On: 10/17/2015 07:21    ____________________________________________   PROCEDURES  Procedure(s) performed:   Procedures  Critical Care performed:    ____________________________________________   INITIAL IMPRESSION / ASSESSMENT AND PLAN / ED COURSE  Pertinent labs & imaging results that were available during my care of the patient were reviewed by me and considered in my medical decision making (see chart for details).  ----------------------------------------- 7:35 AM on 10/17/2015 -----------------------------------------  Patient resting comfortably. Possible UTI versus contamination. Also, wet prep positive for clue cells. However, the patient had near no discharge on speculum exam. Due to being a possible risk to the fetus with first trimester treatment with Flagyl, I will not be treating the patient at this time. She has no vaginal bleeding or discharge. I feel that the risk outweighs the benefits. I discussed this with the patient and she is understanding of this plan and willing to comply. Will be following up with [facility] side OB[facility]GYN. We also reviewed the radiology report.  Clinical Course     ____________________________________________   FINAL CLINICAL IMPRESSION(S) / ED DIAGNOSES  Lower abdominal pain during pregnancy.    NEW MEDICATIONS STARTED DURING THIS VISIT:  New Prescriptions   No medications on file     Note:  This document was prepared using Dragon voice recognition software and may include unintentional dictation errors.    Myrna Blazer, MD 10/17/15 [phone]

## 2015-10-17 NOTE — ED Triage Notes (Signed)
Pt c/o suprapubic pain that started around 11pm last night; denies urinary s/s; denies vaginal bleeding; pt says she may have pulled something moving a pt at work last night; pt is approx [redacted] weeks pregnant, [facility]; LMP 8/25;

## 2015-10-18 LAB — URINE CULTURE

## 2015-10-21 LAB — OB RESULTS CONSOLE HEPATITIS B SURFACE ANTIGEN: HEP B S AG: NEGATIVE

## 2015-10-21 LAB — OB RESULTS CONSOLE HIV ANTIBODY (ROUTINE TESTING): HIV: NONREACTIVE

## 2015-10-21 LAB — OB RESULTS CONSOLE HGB/HCT, BLOOD
HEMATOCRIT: 33 %
HEMOGLOBIN: 11.7 g/dL

## 2015-10-21 LAB — OB RESULTS CONSOLE RUBELLA ANTIBODY, IGM: Rubella: IMMUNE

## 2015-10-21 LAB — OB RESULTS CONSOLE GC/CHLAMYDIA
Chlamydia: NEGATIVE
GC PROBE AMP, GENITAL: NEGATIVE

## 2015-10-21 LAB — OB RESULTS CONSOLE ABO/RH: RH TYPE: POSITIVE

## 2015-10-21 LAB — OB RESULTS CONSOLE RPR: RPR: NONREACTIVE

## 2015-10-21 LAB — OB RESULTS CONSOLE VARICELLA ZOSTER ANTIBODY, IGG: Varicella: IMMUNE

## 2015-10-21 LAB — OB RESULTS CONSOLE PLATELET COUNT: Platelets: 163 10*3/uL

## 2015-10-21 LAB — OB RESULTS CONSOLE ANTIBODY SCREEN: Antibody Screen: NEGATIVE

## 2015-10-25 ENCOUNTER — Encounter: Payer: Self-pay | Admitting: Emergency Medicine

## 2015-10-25 ENCOUNTER — Emergency Department
Admission: EM | Admit: 2015-10-25 | Discharge: 2015-10-25 | Disposition: A | Discharge status: Home / Self Care | Payer: Medicaid Other | Attending: Emergency Medicine | Admitting: Emergency Medicine

## 2015-10-25 DIAGNOSIS — Z79899 Other long term (current) drug therapy: Secondary | ICD-10-CM | POA: Insufficient documentation

## 2015-10-25 DIAGNOSIS — N760 Acute vaginitis: Secondary | ICD-10-CM

## 2015-10-25 DIAGNOSIS — Z3A00 Weeks of gestation of pregnancy not specified: Secondary | ICD-10-CM | POA: Diagnosis not present

## 2015-10-25 DIAGNOSIS — B9689 Other specified bacterial agents as the cause of diseases classified elsewhere: Secondary | ICD-10-CM

## 2015-10-25 DIAGNOSIS — O200 Threatened abortion: Secondary | ICD-10-CM | POA: Diagnosis not present

## 2015-10-25 DIAGNOSIS — O23591 Infection of other part of genital tract in pregnancy, first trimester: Secondary | ICD-10-CM | POA: Diagnosis not present

## 2015-10-25 DIAGNOSIS — Z91040 Latex allergy status: Secondary | ICD-10-CM | POA: Diagnosis not present

## 2015-10-25 DIAGNOSIS — Z3A01 Less than 8 weeks gestation of pregnancy: Secondary | ICD-10-CM | POA: Insufficient documentation

## 2015-10-25 DIAGNOSIS — O209 Hemorrhage in early pregnancy, unspecified: Secondary | ICD-10-CM | POA: Diagnosis present

## 2015-10-25 DIAGNOSIS — Z87891 Personal history of nicotine dependence: Secondary | ICD-10-CM | POA: Diagnosis not present

## 2015-10-25 LAB — CHLAMYDIA/NGC RT PCR (ARMC ONLY)
CHLAMYDIA TR: NOT DETECTED
N GONORRHOEAE: NOT DETECTED

## 2015-10-25 LAB — ABO/RH: ABO/RH(D): A POS

## 2015-10-25 LAB — HCG, QUANTITATIVE, PREGNANCY: HCG, BETA CHAIN, QUANT, S: 35876 m[IU]/mL — AB (ref <5)

## 2015-10-25 LAB — CBC WITH DIFFERENTIAL/PLATELET
BASOS ABS: 0 10*3/uL (ref 0–0.1)
Basophils Relative: 0 %
EOS PCT: 0 %
Eosinophils Absolute: 0 10*3/uL (ref 0–0.7)
HEMATOCRIT: 35.5 % (ref 35.0–47.0)
HEMOGLOBIN: 12.6 g/dL (ref 12.0–16.0)
LYMPHS ABS: 1.1 10*3/uL (ref 1.0–3.6)
LYMPHS PCT: 17 %
MCH: 30.6 pg (ref 26.0–34.0)
MCHC: 35.6 g/dL (ref 32.0–36.0)
MCV: 86 fL (ref 80.0–100.0)
Monocytes Absolute: 0.3 10*3/uL (ref 0.2–0.9)
Monocytes Relative: 4 %
NEUTROS ABS: 5.1 10*3/uL (ref 1.4–6.5)
NEUTROS PCT: 79 %
Platelets: 157 10*3/uL (ref 150–440)
RBC: 4.13 MIL/uL (ref 3.80–5.20)
RDW: 14.7 % — ABNORMAL HIGH (ref 11.5–14.5)
WBC: 6.5 10*3/uL (ref 3.6–11.0)

## 2015-10-25 LAB — WET PREP, GENITAL
Sperm: NONE SEEN
TRICH WET PREP: NONE SEEN
YEAST WET PREP: NONE SEEN

## 2015-10-25 MED ORDER — METRONIDAZOLE 0.75 % VA GEL: 1.0000 | Freq: Two times a day (BID) | VAGINAL | 0 refills | Status: AC

## 2015-10-25 NOTE — ED Provider Notes (Addendum)
[facility] Emergency Department Provider Note  ____________________________________________   I have reviewed the triage vital signs and the nursing notes.   HISTORY  Chief Complaint Abdominal Pain and Vaginal Bleeding    HPI Holly Nicholson is a 30 y.o. female who is G5 P3 who had a spontaneous miscarriage during one of her prior pregnancies, presents today with slight spotting and cramping. Patient states that she has had no significant pain. She denies significant bleeding. On for "a few days". Has established OB care already but not had an ultrasound. No history of ectopic. She states that she once had a questionable tubal pregnancy but a repeat ultrasound showed that it was in the uterus and then she actually had a miscarriage. This was a previous pregnancy. She also states that she is under some increased stress as her significant other has left her temporarily. However she denies being abused at home she has no SI or HI and she states she has a safe place to go. She would not like any further help from me on this issue.     Past Medical History:  Diagnosis Date  . Anxiety   . Elevated blood pressure   . Tubal pregnancy Aug 2016    Patient Active Problem List   Diagnosis Date Noted  . Biliary colic 09/01/2015  . Cholelithiasis without cholecystitis   . Swelling of both ankles 07/21/2015  . Morbid obesity (HCC) 09/06/2014  . Vitamin D deficiency 09/04/2014  . Foot pain, right 09/04/2014  . Generalized anxiety disorder 08/20/2014  . Emesis 08/20/2014  . Bipolar affective disorder, currently depressed, moderate (HCC) 08/20/2014  . Other fatigue 08/20/2014  . Tobacco abuse 08/20/2014  . Preventative health care 08/20/2014  . Transient elevated blood pressure 08/20/2014  . Medication monitoring encounter 08/20/2014  . Pregnancy of unknown anatomic location 08/17/2014  . Pregnancy 04/10/2013    Past Surgical History:  Procedure Laterality Date  .  CHOLECYSTECTOMY N/A 09/02/2015   Procedure: LAPAROSCOPIC CHOLECYSTECTOMY;  Surgeon: Leafy Ro, MD;  Location: [facility] ORS;  Service: General;  Laterality: N/A;  . KNEE SURGERY    . TUBAL LIGATION      Prior to Admission medications   Medication Sig Start Date End Date Taking? Authorizing Provider  ARIPiprazole (ABILIFY) 5 MG tablet Take 5 mg by mouth daily.   Yes Historical Provider, MD  cephALEXin (KEFLEX) 500 MG capsule Take 1 capsule (500 mg total) by mouth 2 (two) times daily. 10/17/15 10/27/15 Yes Myrna Blazer, MD  furosemide (LASIX) 20 MG tablet Take 1 tablet (20 mg total) by mouth daily. 08/06/15  Yes Gabriel Cirri, NP  potassium chloride (K-DUR) 10 MEQ tablet Take 1 tablet (10 mEq total) by mouth daily. 08/06/15  Yes Gabriel Cirri, NP  Prenatal Multivit-Min-Fe-FA (PRENATAL VITAMINS) 0.8 MG tablet Take 1 tablet by mouth daily. 10/17/15  Yes Myrna Blazer, MD  sertraline (ZOLOFT) 100 MG tablet Take 200 mg by mouth daily.    Yes Historical Provider, MD    Allergies Latex  Family History  Problem Relation Age of Onset  . Heart disease Mother   . Hyperlipidemia Mother   . Hypertension Mother   . Diabetes Father   . Heart disease Father   . Hyperlipidemia Father   . Hypertension Father   . Stroke Father   . Cancer Maternal Grandfather     lung  . Cancer Paternal Grandmother     stomach  . Epilepsy Daughter   . Asthma Son   .  Stroke Paternal Grandfather     Social History Social History  Substance Use Topics  . Smoking status: Former Smoker    Packs/day: 0.25    Types: Cigarettes    Quit date: 10/18/2015  . Smokeless tobacco: Never Used  . Alcohol use No    Review of Systems Constitutional: No fever/chills Eyes: No visual changes. ENT: No sore throat. No stiff neck no neck pain Cardiovascular: Denies chest pain. Respiratory: Denies shortness of breath. Gastrointestinal:   no vomiting.  No diarrhea.  No constipation. Genitourinary: Negative for  dysuria. Musculoskeletal: Negative lower extremity swelling Skin: Negative for rash. Neurological: Negative for severe headaches, focal weakness or numbness. 10-point ROS otherwise negative.  ____________________________________________   PHYSICAL EXAM:  VITAL SIGNS: ED Triage Vitals  Enc Vitals Group     BP 10/25/15 1222 (!) 108/58     Pulse Rate 10/25/15 1222 79     Resp 10/25/15 1222 20     Temp 10/25/15 1222 98.4 F (36.9 C)     Temp Source 10/25/15 1222 Oral     SpO2 10/25/15 1222 100 %     Weight 10/25/15 1222 260 lb (117.9 kg)     Height 10/25/15 1222 5\' 3"  (1.6 m)     Head Circumference --      Peak Flow --      Pain Score 10/25/15 1227 9     Pain Loc --      Pain Edu? --      Excl. in GC? --     Constitutional: Alert and oriented. Well appearing and in no acute distress. Eyes: Conjunctivae are normal. PERRL. EOMI. Head: Atraumatic. Nose: No congestion/rhinnorhea. Mouth/Throat: Mucous membranes are moist.  Oropharynx non-erythematous. Neck: No stridor.   Nontender with no meningismus Cardiovascular: Normal rate, regular rhythm. Grossly normal heart sounds.  Good peripheral circulation. Respiratory: Normal respiratory effort.  No retractions. Lungs CTAB. Abdominal: Soft and nontender. No distention. No guarding no rebound Back:  There is no focal tenderness or step off.  there is no midline tenderness there are no lesions noted. there is no CVA tenderness Musculoskeletal: No lower extremity tenderness, no upper extremity tenderness. No joint effusions, no DVT signs strong distal pulses no edema Neurologic:  Normal speech and language. No gross focal neurologic deficits are appreciated.  Skin:  Skin is warm, dry and intact. No rash noted. Psychiatric: Mood and affect are normal. Speech and behavior are normal.  ____________________________________________   LABS (all labs ordered are listed, but only abnormal results are displayed)  Labs Reviewed  HCG,  QUANTITATIVE, PREGNANCY - Abnormal; Notable for the following:       Result Value   hCG, Beta Chain, Quant, S 35,876 (*)    All other components within normal limits  CBC WITH DIFFERENTIAL/PLATELET - Abnormal; Notable for the following:    RDW 14.7 (*)    All other components within normal limits  CHLAMYDIA/NGC RT PCR ([facility] ONLY)  WET PREP, GENITAL  POC URINE PREG, ED  ABO/RH   ____________________________________________  EKG  I personally interpreted any EKGs ordered by me or triage  ____________________________________________  RADIOLOGY  I reviewed any imaging ordered by me or triage that were performed during my shift and, if possible, patient and/or family made aware of any abnormal findings. ____________________________________________   PROCEDURES  Procedure(s) performed: None  Procedures  Critical Care performed: None  ____________________________________________   INITIAL IMPRESSION / ASSESSMENT AND PLAN / ED COURSE  Pertinent labs & imaging results that were available  during my care of the patient were reviewed by me and considered in my medical decision making (see chart for details).  Patient early pregnancy with vaginal spotting, menstrual period was August 25, we will obtain ultrasound and check Rh and type, perform pelvic exam and reassess. Patient very stable appearance. Morbid obesity somewhat limits the exam but no evidence of peritoneal signs.  ----------------------------------------- 2:55 PM on 10/25/2015 -----------------------------------------   I did initially ask her she had an ultrasound and she states no but does turn out that she had one in the emergency department here about one week ago. Unfortunately, that showed a live IUP which indicates at this time probably no indication for repeat ultrasound. The patient is essentially a threatened miscarriage. We will certainly do a pelvic  exam  ----------------------------------------- 3:50 PM on 10/25/2015 -----------------------------------------  Pelvic exam: Female nurse chaperone present, no external lesions noted, physiologic vaginal discharge noted with no purulent discharge, no cervical motion tenderness, no adnexal tenderness or mass, there is no significant uterine tenderness or mass. No vaginal bleeding  Obesity limits the exam  We will discuss with the patient's OB/GYN her Sharene ButtersQuant is going up appreciably last week..  ----------------------------------------- 4:21 PM on 10/25/2015 -----------------------------------------  Discussed with Dr. Jean RosenthalJackson who agrees with the patient discharge and close outpatient follow-up. He would like transvaginal BV treatment.  Clinical Course   ____________________________________________   FINAL CLINICAL IMPRESSION(S) / ED DIAGNOSES  Final diagnoses:  None      This chart was dictated using voice recognition software.  Despite best efforts to proofread,  errors can occur which can change meaning.      Jeanmarie PlantJames A Saxton Chain, MD 10/25/15 1445    Jeanmarie PlantJames A Elizabeht Suto, MD 10/25/15 1451    Jeanmarie PlantJames A Laniqua Torrens, MD 10/25/15 1550    Jeanmarie PlantJames A Maize Brittingham, MD 10/25/15 1621    Jeanmarie PlantJames A Kenesha Moshier, MD 10/25/15 [phone]

## 2015-10-25 NOTE — ED Triage Notes (Signed)
Pt presents with suprapubic pain since last night. Pt states she is [redacted] weeks pregnant and has been spotting since last night. Pt alert & oriented with NAD noted.

## 2016-01-10 NOTE — L&D Delivery Note (Signed)
Obstetrical Delivery Note   Date of Delivery:   06/09/2016 Primary OB:   [facility] Gestational Age/EDD: 2182w0d (Dated by 7wk ultrasound) Antepartum complications: gestational hypertension/ anxiety and depression/ obesity  Delivered By:   Farrel Connersolleen Leif Loflin, CNM  Delivery Type:   spontaneous vaginal delivery  Procedure Details:   CTSP with urge to push. Anterior lip reduced with maternal pushing. Patient pushed effectively delivering a viable female infant on OA. Good cry with tactile stimulation. Baby placed on mother's abdomen. After a delayed cord clamping, the patient cut the cord. Baby (Serenity) then placed skin to skin on mother's chest. Placenta delivered spontaneously and intact. The placenta appeared to be circumvallate.  Anesthesia:    epidural and local for repair Intrapartum complications: gestational hypertension/ ineffective epidural GBS:    Positive (adequately treated) Laceration:    2nd degree and perineal Episiotomy:    none Placenta:    Via active 3rd stage. To pathology: no Estimated Blood Loss:  450 ml  Baby: Liveborn female, Apgars 8/9, weight 7#14 oz    Farrel Connersolleen Zo Loudon, CNM

## 2016-02-16 ENCOUNTER — Other Ambulatory Visit: Payer: Self-pay | Admitting: Obstetrics and Gynecology

## 2016-02-16 DIAGNOSIS — Z3689 Encounter for other specified antenatal screening: Secondary | ICD-10-CM

## 2016-02-18 ENCOUNTER — Emergency Department
Admission: EM | Admit: 2016-02-18 | Discharge: 2016-02-18 | Disposition: A | Discharge status: Home / Self Care | Payer: Medicaid Other | Attending: Emergency Medicine | Admitting: Emergency Medicine

## 2016-02-18 ENCOUNTER — Encounter: Payer: Self-pay | Admitting: Emergency Medicine

## 2016-02-18 DIAGNOSIS — B9789 Other viral agents as the cause of diseases classified elsewhere: Secondary | ICD-10-CM

## 2016-02-18 DIAGNOSIS — Z87891 Personal history of nicotine dependence: Secondary | ICD-10-CM | POA: Insufficient documentation

## 2016-02-18 DIAGNOSIS — R05 Cough: Secondary | ICD-10-CM | POA: Diagnosis not present

## 2016-02-18 DIAGNOSIS — J069 Acute upper respiratory infection, unspecified: Secondary | ICD-10-CM | POA: Insufficient documentation

## 2016-02-18 LAB — INFLUENZA PANEL BY PCR (TYPE A & B)
INFLAPCR: NEGATIVE
INFLBPCR: NEGATIVE

## 2016-02-18 MED ORDER — OSELTAMIVIR PHOSPHATE 75 MG PO CAPS: 75.0000 mg | ORAL_CAPSULE | Freq: Two times a day (BID) | ORAL | 0 refills | Status: AC

## 2016-02-18 NOTE — ED Provider Notes (Signed)
[facility] Emergency Department Provider Note  ____________________________________________  Time seen: Approximately 3:52 PM  I have reviewed the triage vital signs and the nursing notes.   HISTORY  Chief Complaint Cough and Fever    HPI Holly Nicholson is a 31 y.o. female presents to emergency department 1 day of non productive cough, congestion, fever. Patient states that she took Tylenol this morning at 10 AM for fever. Patient is 5 months pregnant. Patient had appointment with Dr. on Wednesday. Patient denies shortness of breath, chest pain, nausea, vomiting, abdominal pain, back pain. Patient did not receive flu vaccine this year. Patient has influenza sick contacts.   Past Medical History:  Diagnosis Date  . Anxiety   . Elevated blood pressure   . Tubal pregnancy Aug 2016    Patient Active Problem List   Diagnosis Date Noted  . Biliary colic 09/01/2015  . Cholelithiasis without cholecystitis   . Swelling of both ankles 07/21/2015  . Morbid obesity (HCC) 09/06/2014  . Vitamin D deficiency 09/04/2014  . Foot pain, right 09/04/2014  . Generalized anxiety disorder 08/20/2014  . Emesis 08/20/2014  . Bipolar affective disorder, currently depressed, moderate (HCC) 08/20/2014  . Other fatigue 08/20/2014  . Tobacco abuse 08/20/2014  . Preventative health care 08/20/2014  . Transient elevated blood pressure 08/20/2014  . Medication monitoring encounter 08/20/2014  . Pregnancy of unknown anatomic location 08/17/2014  . Pregnancy 04/10/2013    Past Surgical History:  Procedure Laterality Date  . CHOLECYSTECTOMY N/A 09/02/2015   Procedure: LAPAROSCOPIC CHOLECYSTECTOMY;  Surgeon: Leafy Roiego F Pabon, MD;  Location: [facility] ORS;  Service: General;  Laterality: N/A;  . KNEE SURGERY    . TUBAL LIGATION      Prior to Admission medications   Medication Sig Start Date End Date Taking? Authorizing Provider  ARIPiprazole (ABILIFY) 5 MG tablet Take 5 mg by  mouth daily.    Historical Provider, MD  furosemide (LASIX) 20 MG tablet Take 1 tablet (20 mg total) by mouth daily. 08/06/15   Gabriel Cirriheryl Wicker, NP  oseltamivir (TAMIFLU) 75 MG capsule Take 1 capsule (75 mg total) by mouth 2 (two) times daily. 02/18/16 02/28/16  Enid DerryAshley Lynisha Osuch, PA-C  potassium chloride (K-DUR) 10 MEQ tablet Take 1 tablet (10 mEq total) by mouth daily. 08/06/15   Gabriel Cirriheryl Wicker, NP  Prenatal Multivit-Min-Fe-FA (PRENATAL VITAMINS) 0.8 MG tablet Take 1 tablet by mouth daily. 10/17/15   Myrna Blazeravid Matthew Schaevitz, MD  sertraline (ZOLOFT) 100 MG tablet Take 200 mg by mouth daily.     Historical Provider, MD    Allergies Latex  Family History  Problem Relation Age of Onset  . Heart disease Mother   . Hyperlipidemia Mother   . Hypertension Mother   . Diabetes Father   . Heart disease Father   . Hyperlipidemia Father   . Hypertension Father   . Stroke Father   . Cancer Maternal Grandfather     lung  . Cancer Paternal Grandmother     stomach  . Epilepsy Daughter   . Asthma Son   . Stroke Paternal Grandfather     Social History Social History  Substance Use Topics  . Smoking status: Former Smoker    Packs/day: 0.25    Types: Cigarettes    Quit date: 10/18/2015  . Smokeless tobacco: Never Used  . Alcohol use No     Review of Systems  Cardiovascular: No chest pain. Respiratory: Cough. No SOB. Gastrointestinal: No abdominal pain.  No nausea, no vomiting.  Musculoskeletal:  Negative for musculoskeletal pain. Skin: Negative for rash, abrasions, lacerations, ecchymosis. Neurological: Negative for headaches, numbness or tingling   ____________________________________________   PHYSICAL EXAM:  VITAL SIGNS: ED Triage Vitals  Enc Vitals Group     BP 02/18/16 1336 103/69     Pulse Rate 02/18/16 1336 (!) 101     Resp 02/18/16 1336 18     Temp 02/18/16 1336 98.2 F (36.8 C)     Temp Source 02/18/16 1336 Oral     SpO2 02/18/16 1336 97 %     Weight 02/18/16 1336 270 lb  (122.5 kg)     Height 02/18/16 1336 5\' 2"  (1.575 m)     Head Circumference --      Peak Flow --      Pain Score 02/18/16 1345 8     Pain Loc --      Pain Edu? --      Excl. in GC? --      Constitutional: Alert and oriented. Well appearing and in no acute distress. Eyes: Conjunctivae are normal. PERRL. EOMI. Head: Atraumatic. ENT:      Ears:      Nose: Mild congestion/rhinnorhea.      Mouth/Throat: Mucous membranes are moist.  Neck: No stridor.  Cardiovascular: Normal rate, regular rhythm.  Good peripheral circulation. Respiratory: Normal respiratory effort without tachypnea or retractions. Lungs CTAB. Good air entry to the bases with no decreased or absent breath sounds. Gastrointestinal: Bowel sounds 4 quadrants. Soft and nontender to palpation. No guarding or rigidity. No palpable masses. No distention.  Musculoskeletal: Full range of motion to all extremities. No gross deformities appreciated. Neurologic:  Normal speech and language. No gross focal neurologic deficits are appreciated.  Skin:  Skin is warm, dry and intact. No rash noted. Psychiatric: Mood and affect are normal. Speech and behavior are normal. Patient exhibits appropriate insight and judgement.   ____________________________________________   LABS (all labs ordered are listed, but only abnormal results are displayed)  Labs Reviewed  INFLUENZA PANEL BY PCR (TYPE A & B)   ____________________________________________  EKG   ____________________________________________  RADIOLOGY   No results found.  ____________________________________________    PROCEDURES  Procedure(s) performed:    Procedures    Medications - No data to display   ____________________________________________   INITIAL IMPRESSION / ASSESSMENT AND PLAN / ED COURSE  Pertinent labs & imaging results that were available during my care of the patient were reviewed by me and considered in my medical decision making (see  chart for details).  Review of the [place] CSRS was performed in accordance of the [facility] prior to dispensing any controlled drugs.     Patient's diagnosis is consistent with Upper respiratory infection. Vital signs and exam are reassuring. Fetal tones reassuring. Influenza test negative. Education about influenza vaccine was provided. Patient will be given Tamiflu due to influenza sick contacts. Patient will be discharged home with prescriptions for Tamiflu. Patient is to follow up with gyn as directed. Patient is given ED precautions to return to the ED for any worsening or new symptoms.     ____________________________________________  FINAL CLINICAL IMPRESSION(S) / ED DIAGNOSES  Final diagnoses:  Viral URI with cough      NEW MEDICATIONS STARTED DURING THIS VISIT:  Discharge Medication List as of 02/18/2016  4:23 PM    START taking these medications   Details  oseltamivir (TAMIFLU) 75 MG capsule Take 1 capsule (75 mg total) by mouth 2 (two) times daily., Starting Fri 02/18/2016, Until Mon  02/28/2016, Print            This chart was dictated using voice recognition software/Dragon. Despite best efforts to proofread, errors can occur which can change the meaning. Any change was purely unintentional.    Enid Derry, PA-C 02/18/16 1906    Emily Filbert, MD 02/19/16 [phone]

## 2016-02-18 NOTE — ED Notes (Signed)
AAOx3.  Skin warm and dry.  NAD 

## 2016-02-18 NOTE — ED Triage Notes (Signed)
C/O fever, cough, sore throat x 1 day.  Last medicated with tylenol at 1000

## 2016-03-02 ENCOUNTER — Encounter: Payer: Self-pay | Admitting: *Deleted

## 2016-03-02 ENCOUNTER — Ambulatory Visit
Admission: RE | Admit: 2016-03-02 | Discharge: 2016-03-02 | Disposition: A | Discharge status: Home / Self Care | Payer: Medicaid Other | Source: Ambulatory Visit | Attending: Obstetrics and Gynecology | Admitting: Obstetrics and Gynecology

## 2016-03-02 DIAGNOSIS — Z3689 Encounter for other specified antenatal screening: Secondary | ICD-10-CM | POA: Insufficient documentation

## 2016-03-02 DIAGNOSIS — O99212 Obesity complicating pregnancy, second trimester: Secondary | ICD-10-CM | POA: Diagnosis not present

## 2016-03-02 DIAGNOSIS — Z6841 Body Mass Index (BMI) 40.0 and over, adult: Secondary | ICD-10-CM | POA: Diagnosis not present

## 2016-03-02 DIAGNOSIS — Z3A25 25 weeks gestation of pregnancy: Secondary | ICD-10-CM | POA: Diagnosis not present

## 2016-03-02 HISTORY — DX: Major depressive disorder, single episode, unspecified: F32.9

## 2016-03-02 HISTORY — DX: Depression, unspecified: F32.A

## 2016-03-09 ENCOUNTER — Other Ambulatory Visit: Payer: Self-pay | Admitting: Obstetrics and Gynecology

## 2016-03-09 DIAGNOSIS — O26843 Uterine size-date discrepancy, third trimester: Secondary | ICD-10-CM

## 2016-03-09 DIAGNOSIS — Z363 Encounter for antenatal screening for malformations: Secondary | ICD-10-CM

## 2016-03-15 ENCOUNTER — Ambulatory Visit (INDEPENDENT_AMBULATORY_CARE_PROVIDER_SITE_OTHER): Payer: Medicaid Other | Admitting: Advanced Practice Midwife

## 2016-03-15 ENCOUNTER — Other Ambulatory Visit: Payer: Medicaid Other

## 2016-03-15 ENCOUNTER — Ambulatory Visit (INDEPENDENT_AMBULATORY_CARE_PROVIDER_SITE_OTHER): Payer: Medicaid Other

## 2016-03-15 VITALS — BP 112/64 | Wt 264.0 lb

## 2016-03-15 DIAGNOSIS — Z363 Encounter for antenatal screening for malformations: Secondary | ICD-10-CM | POA: Diagnosis not present

## 2016-03-15 DIAGNOSIS — Z3A27 27 weeks gestation of pregnancy: Secondary | ICD-10-CM

## 2016-03-15 DIAGNOSIS — Z130 Encounter for screening for diseases of the blood and blood-forming organs and certain disorders involving the immune mechanism: Secondary | ICD-10-CM

## 2016-03-15 DIAGNOSIS — Z113 Encounter for screening for infections with a predominantly sexual mode of transmission: Secondary | ICD-10-CM

## 2016-03-15 DIAGNOSIS — Z131 Encounter for screening for diabetes mellitus: Secondary | ICD-10-CM

## 2016-03-15 NOTE — Progress Notes (Signed)
C/o indigestion. Reviewed comfort measures.

## 2016-03-15 NOTE — Progress Notes (Signed)
Migraines/heartburn/ 28 week labs today

## 2016-03-16 LAB — 28 WEEK RH+PANEL
BASOS: 0 %
Basophils Absolute: 0 10*3/uL (ref 0.0–0.2)
EOS (ABSOLUTE): 0 10*3/uL (ref 0.0–0.4)
EOS: 1 %
GESTATIONAL DIABETES SCREEN: 139 mg/dL (ref 65–139)
HIV SCREEN 4TH GENERATION: NONREACTIVE
Hematocrit: 30 % — ABNORMAL LOW (ref 34.0–46.6)
Hemoglobin: 9.9 g/dL — ABNORMAL LOW (ref 11.1–15.9)
IMMATURE GRANULOCYTES: 0 %
Immature Grans (Abs): 0 10*3/uL (ref 0.0–0.1)
Lymphocytes Absolute: 1.2 10*3/uL (ref 0.7–3.1)
Lymphs: 15 %
MCH: 28.9 pg (ref 26.6–33.0)
MCHC: 33 g/dL (ref 31.5–35.7)
MCV: 88 fL (ref 79–97)
MONOCYTES: 3 %
Monocytes Absolute: 0.3 10*3/uL (ref 0.1–0.9)
Neutrophils Absolute: 6.5 10*3/uL (ref 1.4–7.0)
Neutrophils: 81 %
Platelets: 135 10*3/uL — ABNORMAL LOW (ref 150–379)
RBC: 3.42 x10E6/uL — ABNORMAL LOW (ref 3.77–5.28)
RDW: 14.3 % (ref 12.3–15.4)
RPR: NONREACTIVE
WBC: 8 10*3/uL (ref 3.4–10.8)

## 2016-03-29 ENCOUNTER — Ambulatory Visit (INDEPENDENT_AMBULATORY_CARE_PROVIDER_SITE_OTHER): Payer: Medicaid Other | Admitting: Obstetrics and Gynecology

## 2016-03-29 VITALS — BP 120/80 | Wt 263.0 lb

## 2016-03-29 DIAGNOSIS — Z3400 Encounter for supervision of normal first pregnancy, unspecified trimester: Secondary | ICD-10-CM | POA: Insufficient documentation

## 2016-03-29 DIAGNOSIS — Z3A29 29 weeks gestation of pregnancy: Secondary | ICD-10-CM

## 2016-03-29 NOTE — Progress Notes (Signed)
Pos PNVs. No VB, LOF. Zantac for GERD. Compression socks for LE edema.

## 2016-04-12 ENCOUNTER — Ambulatory Visit (INDEPENDENT_AMBULATORY_CARE_PROVIDER_SITE_OTHER): Payer: Medicaid Other | Admitting: Certified Nurse Midwife

## 2016-04-12 VITALS — BP 122/78 | Wt 262.0 lb

## 2016-04-12 DIAGNOSIS — O0993 Supervision of high risk pregnancy, unspecified, third trimester: Secondary | ICD-10-CM

## 2016-04-12 DIAGNOSIS — Z3A31 31 weeks gestation of pregnancy: Secondary | ICD-10-CM | POA: Diagnosis not present

## 2016-04-12 DIAGNOSIS — Z23 Encounter for immunization: Secondary | ICD-10-CM | POA: Diagnosis not present

## 2016-04-12 MED ORDER — CETIRIZINE HCL 10 MG PO TABS: 10.0000 mg | ORAL_TABLET | Freq: Every day | ORAL | 1 refills | Status: DC | PRN

## 2016-04-12 MED ORDER — ONDANSETRON HCL 8 MG PO TABS: 8.0000 mg | ORAL_TABLET | Freq: Three times a day (TID) | ORAL | 1 refills | Status: DC | PRN

## 2016-04-12 NOTE — Progress Notes (Signed)
Pt c/o nausea and vomiting throughout the day but most severe in the morning.Taking Zantac and PNV. Offered antiemetics and declined. Lost 1 # since last visit.  tdap and blood consent today. Anemia with 9.9gm/dl hemoglobin. RX Fusion Plus with food in evening. Growth scan and ROB in 2 weeks. Farrel Conners, CNM

## 2016-04-13 ENCOUNTER — Observation Stay
Admission: EM | Admit: 2016-04-13 | Discharge: 2016-04-13 | Disposition: A | Discharge status: Home / Self Care | Payer: Medicaid Other | Attending: Obstetrics and Gynecology | Admitting: Obstetrics and Gynecology

## 2016-04-13 DIAGNOSIS — O98813 Other maternal infectious and parasitic diseases complicating pregnancy, third trimester: Secondary | ICD-10-CM | POA: Diagnosis not present

## 2016-04-13 DIAGNOSIS — O212 Late vomiting of pregnancy: Principal | ICD-10-CM | POA: Insufficient documentation

## 2016-04-13 DIAGNOSIS — O219 Vomiting of pregnancy, unspecified: Secondary | ICD-10-CM

## 2016-04-13 DIAGNOSIS — Z3A31 31 weeks gestation of pregnancy: Secondary | ICD-10-CM | POA: Insufficient documentation

## 2016-04-13 DIAGNOSIS — J101 Influenza due to other identified influenza virus with other respiratory manifestations: Secondary | ICD-10-CM | POA: Diagnosis not present

## 2016-04-13 DIAGNOSIS — O99333 Smoking (tobacco) complicating pregnancy, third trimester: Secondary | ICD-10-CM | POA: Insufficient documentation

## 2016-04-13 DIAGNOSIS — Z9049 Acquired absence of other specified parts of digestive tract: Secondary | ICD-10-CM | POA: Insufficient documentation

## 2016-04-13 DIAGNOSIS — Z79899 Other long term (current) drug therapy: Secondary | ICD-10-CM | POA: Diagnosis not present

## 2016-04-13 LAB — INFLUENZA PANEL BY PCR (TYPE A & B)
Influenza A By PCR: NEGATIVE
Influenza B By PCR: POSITIVE — AB

## 2016-04-13 LAB — URINALYSIS, COMPLETE (UACMP) WITH MICROSCOPIC
Bilirubin Urine: NEGATIVE
Glucose, UA: NEGATIVE mg/dL
Hgb urine dipstick: NEGATIVE
Ketones, ur: NEGATIVE mg/dL
Leukocytes, UA: NEGATIVE
Nitrite: NEGATIVE
Protein, ur: NEGATIVE mg/dL
Specific Gravity, Urine: 1.015 (ref 1.005–1.030)
pH: 8 (ref 5.0–8.0)

## 2016-04-13 MED ORDER — OSELTAMIVIR PHOSPHATE 75 MG PO CAPS: 75.0000 mg | ORAL_CAPSULE | Freq: Two times a day (BID) | ORAL | 0 refills | Status: AC

## 2016-04-13 MED ORDER — PROMETHAZINE HCL 25 MG PO TABS: 25.0000 mg | ORAL_TABLET | Freq: Four times a day (QID) | ORAL | 0 refills | Status: DC | PRN

## 2016-04-13 MED ORDER — OSELTAMIVIR PHOSPHATE 75 MG PO CAPS
75.0000 mg | ORAL_CAPSULE | Freq: Once | ORAL | Status: AC
  Administered 2016-04-13: 75 mg via ORAL
  Filled 2016-04-13: qty 1

## 2016-04-13 NOTE — Discharge Instructions (Signed)
Cough, Adult  A cough helps to clear your throat and lungs. A cough may last only 2?3 weeks (acute), or it may last longer than 8 weeks (chronic). Many different things can cause a cough. A cough may be a sign of an illness or another medical condition.  Follow these instructions at home:  ? Pay attention to any changes in your cough.  ? Take medicines only as told by your doctor.  ? If you were prescribed an antibiotic medicine, take it as told by your doctor. Do not stop taking it even if you start to feel better.  ? Talk with your doctor before you try using a cough medicine.  ? Drink enough fluid to keep your pee (urine) clear or pale yellow.  ? If the air is dry, use a cold steam vaporizer or humidifier in your home.  ? Stay away from things that make you cough at work or at home.  ? If your cough is worse at night, try using extra pillows to raise your head up higher while you sleep.  ? Do not smoke, and try not to be around smoke. If you need help quitting, ask your doctor.  ? Do not have caffeine.  ? Do not drink alcohol.  ? Rest as needed.  Contact a doctor if:  ? You have new problems (symptoms).  ? You cough up yellow fluid (pus).  ? Your cough does not get better after 2?3 weeks, or your cough gets worse.  ? Medicine does not help your cough and you are not sleeping well.  ? You have pain that gets worse or pain that is not helped with medicine.  ? You have a fever.  ? You are losing weight and you do not know why.  ? You have night sweats.  Get help right away if:  ? You cough up blood.  ? You have trouble breathing.  ? Your heartbeat is very fast.  This information is not intended to replace advice given to you by your health care provider. Make sure you discuss any questions you have with your health care provider.  Document Released: 09/08/2010 Document Revised: 06/03/2015 Document Reviewed: 03/04/2014  Elsevier Interactive Patient Education ? 2017 [employer]

## 2016-04-13 NOTE — OB Triage Note (Signed)
Patient is a G5P3 at 31.[redacted] weeks gestation who states that she has been having nausea and vomiting since 04/12/16 and intermittent lower back and pelvic pressure that started on 04/13/16. Irregular contractions. Denies vaginal bleeding.

## 2016-04-13 NOTE — Final Progress Note (Signed)
Physician Final Progress Note  Patient ID: Holly Nicholson MRN: [id number] DOB/AGE: [birth year] 31 y.o.  Admit date: 04/13/2016 Admitting provider: Conard Novak, MD Discharge date: 04/13/2016   Admission Diagnoses:  1) intrauterine pregnancy at [redacted]w[redacted]d  2) nausea and vomiting  Discharge Diagnoses:  1) intrauterine pregnancy at [redacted]w[redacted]d  2) nausea and vomiting 3) influenza B   History of Present Illness: The patient is a 31 y.o. female G5P0 at [redacted]w[redacted]d who presents for a two-day history of coughing with nausea and vomiting.  Her cough has gotten progressively worse, as well as her nausea and vomiting. The last time she vomited was about 15 hours ago.  She is able to keep liquids down.  She has eaten very little since yesterday. She was seen in clinic yesterday and was given zofran for her nausea, which has not helped. She has taken nothing for her cough. She has also felt some pelvic pressure associated with her cough. Denies urinary symptoms, vaginal symptoms. She has had no diarrhea.  She denies fevers and chills, trouble breathing. She notes +FM, no LOF, no VB, and no contractions.    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 She was placed on droplet precautions.  Her vitals were normal with normal O2 sats and was afebrile.  She was given a single dose of tamiflu  prior to discharge and was given a 5-day course of Tamiflu for home.  Her UA was negative. She had no contractions on tocometry and her fetal assessment was reassuring on NST.  She was able to tolerate liquids here. She was instructed to stay out of work (due to risk of transmitting influenza to her patients and coworkers) until next Wednesday.  Strict precautions for worsening symptoms was given (fever, chills, trouble breathing).  She was given a prescription for phenergan for her nausea and a bland diet (BRAT) was recommended to her.    Past Medical  History:  Diagnosis Date  . Anxiety   . Depression   . Elevated blood pressure   . Tubal pregnancy Aug 2016    Past Surgical History:  Procedure Laterality Date  . CHOLECYSTECTOMY N/A 09/02/2015   Procedure: LAPAROSCOPIC CHOLECYSTECTOMY;  Surgeon: Leafy Ro, MD;  Location: [facility] ORS;  Service: General;  Laterality: N/A;  . KNEE SURGERY    . TUBAL LIGATION    . TUBAL LIGATION      No current facility-administered medications on file prior to encounter.    Current Outpatient Prescriptions on File Prior to Encounter  Medication Sig Dispense Refill  . cetirizine (ZYRTEC) 10 MG tablet Take 1 tablet (10 mg total) by mouth daily as needed for allergies. 30 tablet 1  . ondansetron (ZOFRAN) 8 MG tablet Take 1 tablet (8 mg total) by mouth every 8 (eight) hours as needed for nausea or vomiting. 30 tablet 1  . Prenatal Multivit-Min-Fe-FA (PRENATAL VITAMINS) 0.8 MG tablet Take 1 tablet by mouth daily. 30 tablet 0  . ranitidine (ZANTAC) 75 MG tablet Take 75 mg by mouth every morning.      Allergies  Allergen Reactions  . Latex Dermatitis    Social History   Social History  . Marital status: Married    Spouse name: N/A  . Number of children: N/A  . Years of education: N/A   Occupational History  . Not on file.   Social History Main Topics  . Smoking status: Current Every Day Smoker  Packs/day: 0.25    Years: 15.00    Types: Cigarettes    Last attempt to quit: 10/18/2015  . Smokeless tobacco: Never Used  . Alcohol use No  . Drug use: No  . Sexual activity: Yes   Other Topics Concern  . Not on file   Social History Narrative  . No narrative on file   Family History  Problem Relation Age of Onset  . Heart disease Mother   . Hyperlipidemia Mother   . Hypertension Mother   . Diabetes Father   . Heart disease Father   . Hyperlipidemia Father   . Hypertension Father   . Stroke Father   . Cancer Maternal Grandfather     lung  . Cancer Paternal Grandmother      stomach  . Epilepsy Daughter   . Asthma Son   . Stroke Paternal Grandfather    Physical Exam: BP 118/68 (BP Location: Left Arm)   Pulse (!) 103   Temp 98.2 F (36.8 C) (Oral)   Resp 18   Ht  (1.6 m)   Wt 260 lb (117.9 kg)   LMP 09/03/2015 (Exact Date)   BMI 46.06 kg/m   Vital Signs: BP 118/68 (BP Location: Left Arm)   Pulse (!) 103   Temp 98.2 F (36.8 C) (Oral)   Resp 18   Ht  (1.6 m)   Wt 260 lb (117.9 kg)   LMP 09/03/2015 (Exact Date)   BMI 46.06 kg/m  Constitutional: Well nourished, well developed female in no acute distress.  HEENT: normal Skin: Warm and dry.  Cardiovascular: Regular rate and rhythm.   Extremity: no edema  Respiratory: Clear to auscultation bilateral. Normal respiratory effort Abdomen: FHT present and gravid, nontender, +BS Back: no CVAT Neuro: DTRs 2+, Cranial nerves grossly intact Psych: Alert and Oriented x3. No memory deficits. Normal mood and affect.  MS: normal gait, normal bilateral lower extremity ROM/strength/stability.  Consults: None  Significant Findings/ Diagnostic Studies:  Lab Results  Component Value Date   INFAP NEGATIVE 04/13/2016   INFBP POSITIVE (A) 04/13/2016    Lab Results  Component Value Date   APPEARANCEUR HAZY (A) 04/13/2016   GLUCOSEU NEGATIVE 04/13/2016   BILIRUBINUR NEGATIVE 04/13/2016   KETONESUR NEGATIVE 04/13/2016   LABSPEC 1.015 04/13/2016   HGBUR NEGATIVE 04/13/2016   PHURINE 8.0 04/13/2016   NITRITE NEGATIVE 04/13/2016   LEUKOCYTESUR NEGATIVE 04/13/2016   RBCU 0-5 04/13/2016   WBCU 0-5 04/13/2016   BACTERIA RARE (A) 04/13/2016   EPIU 0-5 (A) 04/13/2016   MUCOUSUACOMP PRESENT 04/13/2016    Procedures: NST Baseline FHR: 135 beats/min Variability: moderate Accelerations: present Decelerations: absent Tocometry: no activity   Discharge Condition: stable  Disposition: 01-Home or Self Care  Diet: bland, encourage fluids  Discharge Activity: as tolerated, but do not return to work  until next Wednesday provided she has recovered from her influenza symptoms.   Allergies as of 04/13/2016      Reactions   Latex Dermatitis      Medication List    TAKE these medications   cetirizine 10 MG tablet Commonly known as:  ZYRTEC Take 1 tablet (10 mg total) by mouth daily as needed for allergies.   ondansetron 8 MG tablet Commonly known as:  ZOFRAN Take 1 tablet (8 mg total) by mouth every 8 (eight) hours as needed for nausea or vomiting.   oseltamivir 75 MG capsule Commonly known as:  TAMIFLU Take 1 capsule (75 mg total) by mouth 2 (two) times  daily.   Prenatal Vitamins 0.8 MG tablet Take 1 tablet by mouth daily.   promethazine 25 MG tablet Commonly known as:  PHENERGAN Take 1 tablet (25 mg total) by mouth every 6 (six) hours as needed for nausea or vomiting.   ranitidine 75 MG tablet Commonly known as:  ZANTAC Take 75 mg by mouth every morning.      Follow-up Information    [facility] Follow up on 04/27/2016.   Specialty:  Obstetrics and Gynecology Why:  Keep previously scheduled appointment  Contact information: [address] [place] [address] [phone]         Total time spent taking care of this patient: 30 minutes  Signed: Thomasene Mohair, MD  04/13/2016, 5:57 PM

## 2016-04-13 NOTE — Discharge Summary (Signed)
See final progress note. 

## 2016-04-15 LAB — URINE CULTURE

## 2016-04-19 LAB — GLUCOSE, 1 HOUR GESTATIONAL: Glucose, 1 Hour GTT: 117

## 2016-04-25 ENCOUNTER — Other Ambulatory Visit: Payer: Self-pay | Admitting: Obstetrics & Gynecology

## 2016-04-25 DIAGNOSIS — R030 Elevated blood-pressure reading, without diagnosis of hypertension: Secondary | ICD-10-CM

## 2016-04-27 ENCOUNTER — Ambulatory Visit (INDEPENDENT_AMBULATORY_CARE_PROVIDER_SITE_OTHER): Payer: Medicaid Other

## 2016-04-27 ENCOUNTER — Ambulatory Visit (INDEPENDENT_AMBULATORY_CARE_PROVIDER_SITE_OTHER): Payer: Medicaid Other | Admitting: Obstetrics & Gynecology

## 2016-04-27 VITALS — BP 120/80 | Wt 262.0 lb

## 2016-04-27 DIAGNOSIS — R030 Elevated blood-pressure reading, without diagnosis of hypertension: Secondary | ICD-10-CM | POA: Diagnosis not present

## 2016-04-27 DIAGNOSIS — Z3490 Encounter for supervision of normal pregnancy, unspecified, unspecified trimester: Secondary | ICD-10-CM

## 2016-04-27 DIAGNOSIS — Z6841 Body Mass Index (BMI) 40.0 and over, adult: Secondary | ICD-10-CM

## 2016-04-27 DIAGNOSIS — Z3A33 33 weeks gestation of pregnancy: Secondary | ICD-10-CM

## 2016-04-27 NOTE — Progress Notes (Signed)
PNV, FMC, [place] growth and AFI discussed Work discussed Edema monitoring, BP monitoring Obesity RF discussed. GBS nv

## 2016-04-27 NOTE — Progress Notes (Signed)
   [facility] [place], [place] [address] Phone: [phone]   [phone]     April 27, 2016   Patient: Holly Nicholson  Date of Birth: November 08, 1985  Date of Visit: 04/27/2016    To Whom It May Concern:  Meeka Cartelli has been under observation for complications during her pregnancy.  She will be on reduced activity/modified bedrest starting May 29th.  Please excuse her from any strenuous or restricted activities for the rest of her pregnancy at that time.   Patient will also have more frequent office visits for follow up during the rest of her pregnancy.  She has the following restrictions during the rest of  her pregnancy:  No prolonged standing for more than 30 minutes at a time  Frequent breaks (every 2 hours)  Thank you for your understanding.  Sincerely,  Annamarie Major, MD 04/27/2016

## 2016-05-10 ENCOUNTER — Ambulatory Visit (INDEPENDENT_AMBULATORY_CARE_PROVIDER_SITE_OTHER): Payer: Medicaid Other | Admitting: Obstetrics and Gynecology

## 2016-05-10 VITALS — BP 132/86 | Wt 261.0 lb

## 2016-05-10 DIAGNOSIS — O0993 Supervision of high risk pregnancy, unspecified, third trimester: Secondary | ICD-10-CM

## 2016-05-10 DIAGNOSIS — Z6841 Body Mass Index (BMI) 40.0 and over, adult: Secondary | ICD-10-CM

## 2016-05-10 DIAGNOSIS — O99213 Obesity complicating pregnancy, third trimester: Secondary | ICD-10-CM

## 2016-05-10 NOTE — Progress Notes (Signed)
    Routine Prenatal Care Visit  Subjective  Fetal Movement? yes Contractions? no Leaking Fluid? no Vaginal Bleeding? no  Objective   Vitals:   05/10/16 1047  BP: 132/86    @  General: NAD Pumonary: no increased work of breathing Abdomen: gravid, non-tender, fundal height 38, fetal heart tones 140BPM Extremities: no edema Psychiatric: mood appropriate, affect full   Assessment   31 y.o. [id number] at [redacted]w[redacted]d by  06/09/2016, Date entered prior to episode creation presenting for routine prenatal visit  pregnancy Problems (from 03/02/16 to present)    Problem Noted Resolved   Obesity affecting pregnancy in third trimester 05/10/2016 by Vena Austria, MD No   Influenza B 04/13/2016 by Conard Novak, MD No       Plan   Problem List Items Addressed This Visit      Other   BMI 45.0-49.9, adult (HCC) - Primary   Relevant Orders   US OB Comp + 14 Wk   Obesity affecting pregnancy in third trimester   Relevant Orders   US OB Comp + 14 Wk    Other Visit Diagnoses    High-risk pregnancy in third trimester       Relevant Orders   US OB Comp + 14 Wk

## 2016-05-17 ENCOUNTER — Other Ambulatory Visit: Payer: Self-pay | Admitting: Obstetrics and Gynecology

## 2016-05-17 ENCOUNTER — Ambulatory Visit (INDEPENDENT_AMBULATORY_CARE_PROVIDER_SITE_OTHER): Payer: Medicaid Other | Admitting: Obstetrics and Gynecology

## 2016-05-17 ENCOUNTER — Ambulatory Visit (INDEPENDENT_AMBULATORY_CARE_PROVIDER_SITE_OTHER): Payer: Medicaid Other

## 2016-05-17 VITALS — BP 122/80 | Wt 263.0 lb

## 2016-05-17 DIAGNOSIS — O0993 Supervision of high risk pregnancy, unspecified, third trimester: Secondary | ICD-10-CM

## 2016-05-17 DIAGNOSIS — O09893 Supervision of other high risk pregnancies, third trimester: Secondary | ICD-10-CM

## 2016-05-17 DIAGNOSIS — O99213 Obesity complicating pregnancy, third trimester: Secondary | ICD-10-CM

## 2016-05-17 DIAGNOSIS — Z3A36 36 weeks gestation of pregnancy: Secondary | ICD-10-CM

## 2016-05-17 DIAGNOSIS — Z3685 Encounter for antenatal screening for Streptococcus B: Secondary | ICD-10-CM

## 2016-05-17 DIAGNOSIS — Z6841 Body Mass Index (BMI) 40.0 and over, adult: Secondary | ICD-10-CM

## 2016-05-17 DIAGNOSIS — F3132 Bipolar disorder, current episode depressed, moderate: Secondary | ICD-10-CM

## 2016-05-17 DIAGNOSIS — O99343 Other mental disorders complicating pregnancy, third trimester: Secondary | ICD-10-CM | POA: Diagnosis not present

## 2016-05-17 LAB — FETAL NONSTRESS TEST

## 2016-05-17 NOTE — Progress Notes (Signed)
    Routine Prenatal Care Visit  Subjective  Fetal Movement? yes Contractions? yes Leaking Fluid? no Vaginal Bleeding? no  Objective   Vitals:   05/17/16 1320  BP: 122/80    @WEIGHTCHANGE @ Urine dipstick shows negative for all components.  General: NAD Pumonary: no increased work of breathing Abdomen: gravid, non-tender, fundal height 39, fetal heart tones 150BPM Extremities: no edema Psychiatric: mood appropriate, affect full  Baseline: 150 Variability: moderate Accelerations: present Decelerations: absent Tocometry: N/A The patient was monitored for 30 minutes, fetal heart rate tracing was deemed reactive, category I tracing,]   Assessment   31 y.o. [id number] at 2276w5d by  06/09/2016, Date entered prior to episode creation presenting for routine prenatal visit  pregnancy Problems (from 03/02/16 to present)    Problem Noted Resolved   Obesity affecting pregnancy in third trimester 05/10/2016 by Vena AustriaStaebler, Vanessa Alesi, MD No   Influenza B 04/13/2016 by Conard NovakJackson, Stephen D, MD No       Plan   Problem List Items Addressed This Visit      Other   Bipolar affective disorder, currently depressed, moderate (HCC)    Other Visit Diagnoses    [redacted] weeks gestation of pregnancy    -  Primary   Relevant Orders   Amniotic fluid index   Obesity affecting pregnancy in third trimester, antepartum       Relevant Orders   Amniotic fluid index   Supervision of other high risk pregnancies, third trimester       Relevant Orders   Amniotic fluid index   Screening, antenatal, for Streptococcus B       Relevant Orders   Strep Gp B NAA     - reactive NST, normal growth and AFI - continue weekly APT - GBS collected

## 2016-05-17 NOTE — Progress Notes (Signed)
Growth scan today/GBS/NST

## 2016-05-19 LAB — STREP GP B NAA: STREP GROUP B AG: POSITIVE — AB

## 2016-05-22 ENCOUNTER — Observation Stay
Admission: EM | Admit: 2016-05-22 | Discharge: 2016-05-22 | Disposition: A | Discharge status: Home / Self Care | Payer: Medicaid Other | Attending: Obstetrics and Gynecology | Admitting: Obstetrics and Gynecology

## 2016-05-22 ENCOUNTER — Encounter: Payer: Self-pay | Admitting: *Deleted

## 2016-05-22 DIAGNOSIS — F17210 Nicotine dependence, cigarettes, uncomplicated: Secondary | ICD-10-CM | POA: Insufficient documentation

## 2016-05-22 DIAGNOSIS — O26893 Other specified pregnancy related conditions, third trimester: Principal | ICD-10-CM | POA: Insufficient documentation

## 2016-05-22 DIAGNOSIS — Z79899 Other long term (current) drug therapy: Secondary | ICD-10-CM | POA: Diagnosis not present

## 2016-05-22 DIAGNOSIS — O99333 Smoking (tobacco) complicating pregnancy, third trimester: Secondary | ICD-10-CM | POA: Diagnosis not present

## 2016-05-22 DIAGNOSIS — R102 Pelvic and perineal pain: Secondary | ICD-10-CM | POA: Diagnosis not present

## 2016-05-22 DIAGNOSIS — Z9049 Acquired absence of other specified parts of digestive tract: Secondary | ICD-10-CM | POA: Insufficient documentation

## 2016-05-22 DIAGNOSIS — Z3A37 37 weeks gestation of pregnancy: Secondary | ICD-10-CM | POA: Insufficient documentation

## 2016-05-22 NOTE — Discharge Summary (Signed)
Physician Final Progress Note  Patient ID: Holly Nicholson MRN: [id number] DOB/AGE: 05/15/1985 30 y.o.  Admit date: 05/22/2016 Admitting provider: Tresea Mall, CNM Discharge date: 05/22/2016   Admission Diagnoses: Crotch pain and contractions  Discharge Diagnoses:  Active Problems:   Indication for care in labor and delivery, antepartum IUP at [redacted]w[redacted]d with reactive NST, not in labor. Crotch pain persists  History of Present Illness: The patient is a 31 y.o. female [phone] at [redacted]w[redacted]d who presents for a fall landing on her crotch on the bathtub earlier today and then this afternoon at work she experienced crampy contractions every 8 minutes. The patient denies hitting her belly. She denies LOF, VB. She denies contractions since being at the hospital.  She admits positive fetal movement.   Past Medical History:  Diagnosis Date  . Anxiety   . BMI 45.0-49.9, adult (HCC)   . Depression   . Elevated blood pressure   . History of bilateral tubal ligation 2007  . History of bilateral tubal ligation 2007  . History of reversal of tubal ligation   . Low-lying placenta   . Tubal pregnancy Aug 2016    Past Surgical History:  Procedure Laterality Date  . CHOLECYSTECTOMY N/A 09/02/2015   Procedure: LAPAROSCOPIC CHOLECYSTECTOMY;  Surgeon: Leafy Ro, MD;  Location: [facility] ORS;  Service: General;  Laterality: N/A;  . KNEE SURGERY    . TUBAL LIGATION    . TUBAL LIGATION      No current facility-administered medications on file prior to encounter.    Current Outpatient Prescriptions on File Prior to Encounter  Medication Sig Dispense Refill  . Prenatal Multivit-Min-Fe-FA (PRENATAL VITAMINS) 0.8 MG tablet Take 1 tablet by mouth daily. 30 tablet 0  . ranitidine (ZANTAC) 75 MG tablet Take 75 mg by mouth every morning.    . cetirizine (ZYRTEC) 10 MG tablet Take 1 tablet (10 mg total) by mouth daily as needed for allergies. (Patient not taking: Reported on 05/22/2016) 30 tablet 1  . ondansetron  (ZOFRAN) 8 MG tablet Take 1 tablet (8 mg total) by mouth every 8 (eight) hours as needed for nausea or vomiting. (Patient not taking: Reported on 05/22/2016) 30 tablet 1  . promethazine (PHENERGAN) 25 MG tablet Take 1 tablet (25 mg total) by mouth every 6 (six) hours as needed for nausea or vomiting. (Patient not taking: Reported on 05/22/2016) 30 tablet 0    Allergies  Allergen Reactions  . Latex Dermatitis    Social History   Social History  . Marital status: Married    Spouse name: N/A  . Number of children: N/A  . Years of education: N/A   Occupational History  . Not on file.   Social History Main Topics  . Smoking status: Current Every Day Smoker    Packs/day: 0.25    Years: 15.00    Types: Cigarettes    Last attempt to quit: 10/18/2015  . Smokeless tobacco: Never Used  . Alcohol use No  . Drug use: No  . Sexual activity: Yes   Other Topics Concern  . Not on file   Social History Narrative  . No narrative on file    Physical Exam: Temp 98.3 F (36.8 C) (Oral)   Resp 16   LMP 09/03/2015 (Exact Date)   Gen: NAD CV: RRR Pulm: CTAB Pelvic: deferred Toco: negative Fetal well being: 125 bpm, moderate variability, +accelerations, -decelerations Ext: +1 edema lower extremities  Consults: None  Significant Findings/ Diagnostic Studies: none  Procedures: NST  Discharge Condition: good  Disposition: 01-Home or Self Care  Diet: Regular diet  Discharge Activity: Activity as tolerated, heat or ice as desired, soak in bathtub, rest tomorrow and return to work on Wednesday.  Discharge Instructions    Discharge activity:  No Restrictions    Complete by:  As directed    Activity as tolerated. Return to work on Wednesday.   Discharge diet:  No restrictions    Complete by:  As directed    Fetal Kick Count:  Lie on our left side for one hour after a meal, and count the number of times your baby kicks.  If it is less than 5 times, get up, move around and drink some  juice.  Repeat the test 30 minutes later.  If it is still less than 5 kicks in an hour, notify your doctor.    Complete by:  As directed    LABOR:  When conractions begin, you should start to time them from the beginning of one contraction to the beginning  of the next.  When contractions are 5 - 10 minutes apart or less and have been regular for at least an hour, you should call your health care provider.    Complete by:  As directed    No sexual activity restrictions    Complete by:  As directed    Notify physician for bleeding from the vagina    Complete by:  As directed    Notify physician for blurring of vision or spots before the eyes    Complete by:  As directed    Notify physician for chills or fever    Complete by:  As directed    Notify physician for fainting spells, "black outs" or loss of consciousness    Complete by:  As directed    Notify physician for increase in vaginal discharge    Complete by:  As directed    Notify physician for leaking of fluid    Complete by:  As directed    Notify physician for pain or burning when urinating    Complete by:  As directed    Notify physician for pelvic pressure (sudden increase)    Complete by:  As directed    Notify physician for severe or continued nausea or vomiting    Complete by:  As directed    Notify physician for sudden gushing of fluid from the vagina (with or without continued leaking)    Complete by:  As directed    Notify physician for sudden, constant, or occasional abdominal pain    Complete by:  As directed    Notify physician if baby moving less than usual    Complete by:  As directed      Allergies as of 05/22/2016      Reactions   Latex Dermatitis      Medication List    STOP taking these medications   cetirizine 10 MG tablet Commonly known as:  ZYRTEC   ondansetron 8 MG tablet Commonly known as:  ZOFRAN   promethazine 25 MG tablet Commonly known as:  PHENERGAN     TAKE these medications    Prenatal Vitamins 0.8 MG tablet Take 1 tablet by mouth daily.   ranitidine 75 MG tablet Commonly known as:  ZANTAC Take 75 mg by mouth every morning.      Follow-up Information    [facility] Follow up.   Why:  go to regular scheduled prenatal appointment on Thursday Contact  information: [address] [place] [phone]          Total time spent taking care of this patient: 15 minutes  Signed: Tresea MallJane Cirilo Canner, CNM  05/22/2016, 6:33 PM

## 2016-05-22 NOTE — OB Triage Note (Signed)
Pt presents with complaint of some contractions about every 8 minutes that started after she fell in her shower today. She denies hitting her abdomen but did fall hitting her crouch on the tub. Denies bleeding, states some discharge but not watery, more of a mucous.

## 2016-05-25 ENCOUNTER — Ambulatory Visit (INDEPENDENT_AMBULATORY_CARE_PROVIDER_SITE_OTHER): Payer: Medicaid Other | Admitting: Certified Nurse Midwife

## 2016-05-25 ENCOUNTER — Ambulatory Visit (INDEPENDENT_AMBULATORY_CARE_PROVIDER_SITE_OTHER): Payer: Medicaid Other

## 2016-05-25 ENCOUNTER — Other Ambulatory Visit: Payer: Self-pay | Admitting: Certified Nurse Midwife

## 2016-05-25 VITALS — BP 120/70 | Wt 262.0 lb

## 2016-05-25 DIAGNOSIS — Z3A37 37 weeks gestation of pregnancy: Secondary | ICD-10-CM

## 2016-05-25 DIAGNOSIS — O1203 Gestational edema, third trimester: Secondary | ICD-10-CM

## 2016-05-25 DIAGNOSIS — O99213 Obesity complicating pregnancy, third trimester: Secondary | ICD-10-CM

## 2016-05-25 DIAGNOSIS — O0990 Supervision of high risk pregnancy, unspecified, unspecified trimester: Secondary | ICD-10-CM

## 2016-05-25 DIAGNOSIS — O99820 Streptococcus B carrier state complicating pregnancy: Secondary | ICD-10-CM | POA: Diagnosis not present

## 2016-05-25 DIAGNOSIS — Z6841 Body Mass Index (BMI) 40.0 and over, adult: Secondary | ICD-10-CM | POA: Diagnosis not present

## 2016-05-25 LAB — FETAL NONSTRESS TEST

## 2016-05-25 NOTE — Progress Notes (Signed)
AFI/NST today. Pt reports no problems.

## 2016-05-31 ENCOUNTER — Other Ambulatory Visit: Payer: Medicaid Other

## 2016-05-31 ENCOUNTER — Ambulatory Visit (INDEPENDENT_AMBULATORY_CARE_PROVIDER_SITE_OTHER): Payer: Medicaid Other | Admitting: Certified Nurse Midwife

## 2016-05-31 VITALS — BP 126/86 | Wt 262.0 lb

## 2016-05-31 DIAGNOSIS — O99213 Obesity complicating pregnancy, third trimester: Secondary | ICD-10-CM

## 2016-05-31 DIAGNOSIS — Z3A38 38 weeks gestation of pregnancy: Secondary | ICD-10-CM

## 2016-05-31 DIAGNOSIS — O0990 Supervision of high risk pregnancy, unspecified, unspecified trimester: Secondary | ICD-10-CM

## 2016-05-31 LAB — FETAL NONSTRESS TEST

## 2016-05-31 NOTE — Progress Notes (Signed)
No c/o's

## 2016-06-01 DIAGNOSIS — O0990 Supervision of high risk pregnancy, unspecified, unspecified trimester: Secondary | ICD-10-CM | POA: Insufficient documentation

## 2016-06-01 NOTE — Progress Notes (Signed)
Irregular contractions/ no LOF or vaginal bleeding NST reactive with baseline 145-150 and accelerations to 170s, moderate variability AFI=10.77cm= 4.66cm+ 3.60 cm +2.51 cm/ vertex RTO 1 week for NST and AFI Breast and bottle Bilateral tubal (prior BTL failure)? salpingectomy

## 2016-06-04 NOTE — Progress Notes (Signed)
Pedal edema (Used to take Lasix before pregnancy) GBS positive Breast and bottle feeding Labor precautions Reactive NST/AFI 6 cm Encouraged to increase water intake NST and AFI in 1 week

## 2016-06-07 ENCOUNTER — Encounter: Payer: Medicaid Other | Admitting: Certified Nurse Midwife

## 2016-06-08 ENCOUNTER — Ambulatory Visit (INDEPENDENT_AMBULATORY_CARE_PROVIDER_SITE_OTHER): Payer: Medicaid Other | Admitting: Obstetrics & Gynecology

## 2016-06-08 ENCOUNTER — Ambulatory Visit (INDEPENDENT_AMBULATORY_CARE_PROVIDER_SITE_OTHER): Payer: Medicaid Other

## 2016-06-08 ENCOUNTER — Inpatient Hospital Stay
Admission: EM | Admit: 2016-06-08 | Discharge: 2016-06-11 | DRG: 775 | Disposition: A | Discharge status: Home / Self Care | Payer: Medicaid Other | Attending: Certified Nurse Midwife | Admitting: Certified Nurse Midwife

## 2016-06-08 ENCOUNTER — Encounter: Payer: Self-pay | Admitting: *Deleted

## 2016-06-08 VITALS — BP 150/98 | Wt 264.0 lb

## 2016-06-08 DIAGNOSIS — Z3A00 Weeks of gestation of pregnancy not specified: Secondary | ICD-10-CM | POA: Diagnosis not present

## 2016-06-08 DIAGNOSIS — F17210 Nicotine dependence, cigarettes, uncomplicated: Secondary | ICD-10-CM | POA: Diagnosis present

## 2016-06-08 DIAGNOSIS — F419 Anxiety disorder, unspecified: Secondary | ICD-10-CM | POA: Diagnosis present

## 2016-06-08 DIAGNOSIS — Z6841 Body Mass Index (BMI) 40.0 and over, adult: Secondary | ICD-10-CM | POA: Diagnosis not present

## 2016-06-08 DIAGNOSIS — O99213 Obesity complicating pregnancy, third trimester: Secondary | ICD-10-CM | POA: Diagnosis not present

## 2016-06-08 DIAGNOSIS — Z3A39 39 weeks gestation of pregnancy: Secondary | ICD-10-CM | POA: Diagnosis not present

## 2016-06-08 DIAGNOSIS — J101 Influenza due to other identified influenza virus with other respiratory manifestations: Secondary | ICD-10-CM

## 2016-06-08 DIAGNOSIS — O99824 Streptococcus B carrier state complicating childbirth: Secondary | ICD-10-CM | POA: Diagnosis present

## 2016-06-08 DIAGNOSIS — Z3682 Encounter for antenatal screening for nuchal translucency: Secondary | ICD-10-CM | POA: Diagnosis not present

## 2016-06-08 DIAGNOSIS — O133 Gestational [pregnancy-induced] hypertension without significant proteinuria, third trimester: Secondary | ICD-10-CM | POA: Diagnosis present

## 2016-06-08 DIAGNOSIS — O99344 Other mental disorders complicating childbirth: Secondary | ICD-10-CM | POA: Diagnosis present

## 2016-06-08 DIAGNOSIS — E669 Obesity, unspecified: Secondary | ICD-10-CM | POA: Diagnosis present

## 2016-06-08 DIAGNOSIS — F329 Major depressive disorder, single episode, unspecified: Secondary | ICD-10-CM | POA: Diagnosis present

## 2016-06-08 DIAGNOSIS — Z91040 Latex allergy status: Secondary | ICD-10-CM | POA: Diagnosis not present

## 2016-06-08 DIAGNOSIS — O99334 Smoking (tobacco) complicating childbirth: Secondary | ICD-10-CM | POA: Diagnosis present

## 2016-06-08 DIAGNOSIS — O99214 Obesity complicating childbirth: Secondary | ICD-10-CM | POA: Diagnosis present

## 2016-06-08 DIAGNOSIS — O0993 Supervision of high risk pregnancy, unspecified, third trimester: Secondary | ICD-10-CM

## 2016-06-08 DIAGNOSIS — O134 Gestational [pregnancy-induced] hypertension without significant proteinuria, complicating childbirth: Principal | ICD-10-CM | POA: Diagnosis present

## 2016-06-08 DIAGNOSIS — O0990 Supervision of high risk pregnancy, unspecified, unspecified trimester: Secondary | ICD-10-CM

## 2016-06-08 DIAGNOSIS — R030 Elevated blood-pressure reading, without diagnosis of hypertension: Secondary | ICD-10-CM | POA: Diagnosis present

## 2016-06-08 LAB — CBC WITH DIFFERENTIAL/PLATELET
Basophils Absolute: 0.1 10*3/uL (ref 0–0.1)
Basophils Relative: 1 %
Eosinophils Absolute: 0 10*3/uL (ref 0–0.7)
Eosinophils Relative: 0 %
HEMATOCRIT: 31 % — AB (ref 35.0–47.0)
Hemoglobin: 10.6 g/dL — ABNORMAL LOW (ref 12.0–16.0)
LYMPHS PCT: 14 %
Lymphs Abs: 1.2 10*3/uL (ref 1.0–3.6)
MCH: 27.7 pg (ref 26.0–34.0)
MCHC: 34.3 g/dL (ref 32.0–36.0)
MCV: 80.9 fL (ref 80.0–100.0)
MONO ABS: 0.4 10*3/uL (ref 0.2–0.9)
MONOS PCT: 5 %
NEUTROS ABS: 6.8 10*3/uL — AB (ref 1.4–6.5)
Neutrophils Relative %: 80 %
Platelets: 158 10*3/uL (ref 150–440)
RBC: 3.84 MIL/uL (ref 3.80–5.20)
RDW: 15.7 % — ABNORMAL HIGH (ref 11.5–14.5)
WBC: 8.5 10*3/uL (ref 3.6–11.0)

## 2016-06-08 LAB — TYPE AND SCREEN
ABO/RH(D): A POS
ANTIBODY SCREEN: NEGATIVE

## 2016-06-08 LAB — COMPREHENSIVE METABOLIC PANEL
ALT: 17 U/L (ref 14–54)
ANION GAP: 6 (ref 5–15)
AST: 17 U/L (ref 15–41)
Albumin: 3 g/dL — ABNORMAL LOW (ref 3.5–5.0)
Alkaline Phosphatase: 200 U/L — ABNORMAL HIGH (ref 38–126)
BILIRUBIN TOTAL: 0.5 mg/dL (ref 0.3–1.2)
BUN: 8 mg/dL (ref 6–20)
CO2: 24 mmol/L (ref 22–32)
Calcium: 8.9 mg/dL (ref 8.9–10.3)
Chloride: 105 mmol/L (ref 101–111)
Creatinine, Ser: 0.68 mg/dL (ref 0.44–1.00)
Glucose, Bld: 77 mg/dL (ref 65–99)
Potassium: 4.1 mmol/L (ref 3.5–5.1)
Sodium: 135 mmol/L (ref 135–145)
Total Protein: 6.7 g/dL (ref 6.5–8.1)

## 2016-06-08 LAB — PROTEIN / CREATININE RATIO, URINE
CREATININE, URINE: 250 mg/dL
PROTEIN CREATININE RATIO: 0.1 mg/mg{creat} (ref 0.00–0.15)
Total Protein, Urine: 25 mg/dL

## 2016-06-08 MED ORDER — SOD CITRATE-CITRIC ACID 500-334 MG/5ML PO SOLN
30.0000 mL | ORAL | Status: DC | PRN
  Filled 2016-06-08: qty 30

## 2016-06-08 MED ORDER — FAMOTIDINE 20 MG PO TABS
20.0000 mg | ORAL_TABLET | Freq: Every day | ORAL | Status: DC
  Administered 2016-06-08 – 2016-06-11 (×3): 20 mg via ORAL
  Filled 2016-06-08 (×2): qty 1

## 2016-06-08 MED ORDER — MISOPROSTOL 200 MCG PO TABS
ORAL_TABLET | ORAL | Status: DC
Start: 2016-06-08 — End: 2016-06-09
  Filled 2016-06-08: qty 4

## 2016-06-08 MED ORDER — AMMONIA AROMATIC IN INHA
RESPIRATORY_TRACT | Status: AC
  Filled 2016-06-08: qty 10

## 2016-06-08 MED ORDER — NICOTINE 7 MG/24HR TD PT24
7.0000 mg | MEDICATED_PATCH | Freq: Every day | TRANSDERMAL | Status: DC
  Administered 2016-06-08 – 2016-06-11 (×4): 7 mg via TRANSDERMAL
  Filled 2016-06-08 (×5): qty 1

## 2016-06-08 MED ORDER — PENICILLIN G POT IN DEXTROSE 60000 UNIT/ML IV SOLN
3.0000 10*6.[IU] | INTRAVENOUS | Status: DC
  Administered 2016-06-08 – 2016-06-09 (×5): 3 10*6.[IU] via INTRAVENOUS
  Filled 2016-06-08 (×15): qty 50

## 2016-06-08 MED ORDER — PENICILLIN G POTASSIUM 5000000 UNITS IJ SOLR
5.0000 10*6.[IU] | Freq: Once | INTRAMUSCULAR | Status: AC
  Administered 2016-06-08: 5 10*6.[IU] via INTRAVENOUS
  Filled 2016-06-08: qty 5

## 2016-06-08 MED ORDER — OXYTOCIN 40 UNITS IN LACTATED RINGERS INFUSION - SIMPLE MED
2.5000 [IU]/h | INTRAVENOUS | Status: DC
  Filled 2016-06-08: qty 1000

## 2016-06-08 MED ORDER — OXYTOCIN 10 UNIT/ML IJ SOLN: 10.0000 [IU] | Freq: Once | INTRAMUSCULAR | Status: DC

## 2016-06-08 MED ORDER — FAMOTIDINE 20 MG PO TABS
ORAL_TABLET | ORAL | Status: AC
  Filled 2016-06-08: qty 1

## 2016-06-08 MED ORDER — LIDOCAINE HCL (PF) 1 % IJ SOLN: 30.0000 mL | INTRAMUSCULAR | Status: DC | PRN

## 2016-06-08 MED ORDER — MISOPROSTOL 25 MCG QUARTER TABLET
ORAL_TABLET | ORAL | Status: AC
  Filled 2016-06-08: qty 1

## 2016-06-08 MED ORDER — MISOPROSTOL 25 MCG QUARTER TABLET
25.0000 ug | ORAL_TABLET | ORAL | Status: DC | PRN
  Administered 2016-06-08 – 2016-06-09 (×4): 25 ug via VAGINAL
  Filled 2016-06-08 (×3): qty 1

## 2016-06-08 MED ORDER — LACTATED RINGERS IV SOLN
INTRAVENOUS | Status: DC
  Administered 2016-06-08: 1000 mL via INTRAVENOUS
  Administered 2016-06-08: 21:00:00 via INTRAVENOUS

## 2016-06-08 MED ORDER — OXYTOCIN BOLUS FROM INFUSION
500.0000 mL | Freq: Once | INTRAVENOUS | Status: AC
  Administered 2016-06-09: 500 mL via INTRAVENOUS

## 2016-06-08 MED ORDER — ONDANSETRON HCL 4 MG/2ML IJ SOLN
4.0000 mg | Freq: Four times a day (QID) | INTRAMUSCULAR | Status: DC | PRN
  Administered 2016-06-09: 4 mg via INTRAVENOUS
  Filled 2016-06-08: qty 2

## 2016-06-08 MED ORDER — LIDOCAINE HCL (PF) 1 % IJ SOLN
INTRAMUSCULAR | Status: AC
  Filled 2016-06-08: qty 30

## 2016-06-08 MED ORDER — TERBUTALINE SULFATE 1 MG/ML IJ SOLN: 0.2500 mg | Freq: Once | INTRAMUSCULAR | Status: DC | PRN

## 2016-06-08 MED ORDER — OXYTOCIN 10 UNIT/ML IJ SOLN
INTRAMUSCULAR | Status: AC
  Filled 2016-06-08: qty 2

## 2016-06-08 MED ORDER — LACTATED RINGERS IV SOLN: 500.0000 mL | INTRAVENOUS | Status: DC | PRN

## 2016-06-08 NOTE — H&P (Signed)
OB History & Physical   History of Present Illness:  Chief Complaint: elevated blood pressure in office  HPI:  Holly Nicholson is a 31 y.o. [phone] female at [redacted]w[redacted]d dated by LMP consistent with a 7 week ultrasound.  Her pregnancy has been complicated by obesity (BMI 45-50).    She denies contractions.   She denies leakage of fluid.   She denies vaginal bleeding.   She reports fetal movement.  Denies headache, visual disturbances, and RUQ pain.   Maternal Medical History:   Past Medical History:  Diagnosis Date  . Anxiety   . BMI 45.0-49.9, adult (HCC)   . Depression   . Elevated blood pressure   . History of bilateral tubal ligation 2007  . History of bilateral tubal ligation 2007  . History of reversal of tubal ligation   . Low-lying placenta   . Tubal pregnancy Aug 2016    Past Surgical History:  Procedure Laterality Date  . CHOLECYSTECTOMY N/A 09/02/2015   Procedure: LAPAROSCOPIC CHOLECYSTECTOMY;  Surgeon: Leafy Ro, MD;  Location: [facility] ORS;  Service: General;  Laterality: N/A;  . KNEE SURGERY    . TUBAL LIGATION    . TUBAL LIGATION      Allergies  Allergen Reactions  . Latex Dermatitis    Prior to Admission medications   Medication Sig Start Date End Date Taking? Authorizing Provider  Prenatal Multivit-Min-Fe-FA (PRENATAL VITAMINS) 0.8 MG tablet Take 1 tablet by mouth daily. 10/17/15  Yes Myrna Blazer, MD  ranitidine (ZANTAC) 75 MG tablet Take 75 mg by mouth every morning.   Yes [provider]    OB History  Gravida Para Term Preterm AB Living  5 3 3   1 3   SAB TAB Ectopic Multiple Live Births      1   3    # Outcome Date GA Lbr Len/2nd Weight Sex Delivery Anes PTL Lv  5 Current           4 Term 09/30/13 [redacted]w[redacted]d  8 lb 4 oz (3.742 kg) M Vag-Spont   LIV  3 Term 12/15/04   7 lb 14 oz (3.572 kg) F Vag-Spont   LIV  2 Term 01/07/03 [redacted]w[redacted]d  7 lb 12 oz (3.515 kg) M Vag-Spont   LIV  1 Ectopic               Prenatal care site: [facility]  Social History: She  reports that she has been smoking Cigarettes.  She has a 3.75 pack-year smoking history. She has never used smokeless tobacco. She reports that she does not drink alcohol or use drugs.  Family History: family history includes Asthma in her son; Cancer in her maternal grandfather and paternal grandmother; Diabetes in her father; Epilepsy in her daughter; Heart disease in her father and mother; Hyperlipidemia in her father and mother; Hypertension in her father and mother; Stroke in her father and paternal grandfather.   Review of Systems: Review of Systems  Constitutional: Negative.   HENT: Negative.   Eyes: Negative.  Negative for blurred vision and double vision.  Respiratory: Negative.   Cardiovascular: Negative.   Gastrointestinal: Positive for heartburn. Negative for abdominal pain, constipation, diarrhea, nausea and vomiting.  Genitourinary: Negative.   Musculoskeletal: Negative.   Skin: Negative.   Neurological: Positive for headaches (mild HA that has been present for weeks, resolving per patient). Negative for seizures.  Psychiatric/Behavioral: Negative.      Physical Exam:  Vital Signs: BP (!) 154/94  Pulse 77   Temp 98.3 F (36.8 C) (Oral)   Resp 16   Ht 5\' 3"  (1.6 m)   Wt 264 lb (119.7 kg)   LMP 09/03/2015 (Exact Date)   BMI 46.77 kg/m  Physical Exam  Constitutional: She is oriented to person, place, and time. She appears well-developed and well-nourished. No distress.  HENT:  Head: Normocephalic and atraumatic.  Eyes: Conjunctivae are normal. No scleral icterus.  Neck: Normal range of motion. Neck supple. No thyromegaly present.  Cardiovascular: Normal rate and regular rhythm.   Pulmonary/Chest: Effort normal and breath sounds normal. She has no wheezes. She has no rales.  Abdominal: Soft. She exhibits no distension. There is no tenderness. Hernia confirmed negative in the right inguinal area and confirmed negative in the left inguinal  area.  Gravid, EFW 8 pounds  Genitourinary: Pelvic exam was performed with patient supine. There is no rash, tenderness or lesion on the right labia. There is no rash, tenderness or lesion on the left labia.  Genitourinary Comments: Cervix 0.5cm per Dr Tiburcio PeaHarris in clinic today.  Musculoskeletal: Normal range of motion.  Lymphadenopathy:       Right: No inguinal adenopathy present.       Left: No inguinal adenopathy present.  Neurological: She is alert and oriented to person, place, and time. She displays normal reflexes (Brachiradialis 2+).  Skin: Skin is warm and dry. No rash noted.  Psychiatric: She has a normal mood and affect. Her behavior is normal. Judgment normal.    Pertinent Results:  Prenatal Labs: Blood type/Rh A positive  Antibody screen negative  Rubella Immune  Varicella Immune    RPR NR  HBsAg negative  HIV negative  GC negative  Chlamydia negative  Genetic screening Not done  1 hour GTT Early -117, 28-wekk = 139   3 hour GTT n/a  GBS positive   Baseline FHR: 145 beats/min   Variability: moderate   Accelerations: present   Decelerations: absent Contractions: absent frequency: n/a Overall assessment: cat 1  Assessment:  Holly Nicholson is a 31 y.o. [phone] female at 2149w6d with gestational hypertension.   Plan:  1. Admit to Labor & Delivery  2. CBC, T&S, Clrs, IVF 3. GBS positive- PCN per protocol 4. Fetwal well-being: reassuring, category 1 5. Discussed risks/benefits of IOL. Patient wishes to proceed.    Thomasene MohairStephen Mikiah Durall, MD 06/08/2016 2:24 PM

## 2016-06-08 NOTE — OB Triage Note (Signed)
Sent from office for [facility] workup due to elevated BPs in office.

## 2016-06-08 NOTE — Progress Notes (Signed)
A NST procedure was performed with FHR monitoring and a normal baseline established, appropriate time of 20-40 minutes of evaluation, and accels >2 seen w 15x15 characteristics.  Results show a REACTIVE NST. Baseline is at 170s. AFI normal, Vtx by US today.  Review of ULTRASOUND.   I have personally reviewed images and report of recent [facility]. Due to BP elevation, will evaluate on L&D for preeclampsia or gestational HTN, vs spurious elevation. IOL discussed as a possibility. Obesity RF discussed.

## 2016-06-09 ENCOUNTER — Inpatient Hospital Stay: Payer: Medicaid Other | Admitting: Registered Nurse

## 2016-06-09 DIAGNOSIS — Z3A39 39 weeks gestation of pregnancy: Secondary | ICD-10-CM

## 2016-06-09 DIAGNOSIS — O134 Gestational [pregnancy-induced] hypertension without significant proteinuria, complicating childbirth: Secondary | ICD-10-CM

## 2016-06-09 LAB — CBC
HEMATOCRIT: 31.3 % — AB (ref 35.0–47.0)
HEMOGLOBIN: 10.6 g/dL — AB (ref 12.0–16.0)
MCH: 27.3 pg (ref 26.0–34.0)
MCHC: 33.9 g/dL (ref 32.0–36.0)
MCV: 80.4 fL (ref 80.0–100.0)
Platelets: 139 10*3/uL — ABNORMAL LOW (ref 150–440)
RBC: 3.89 MIL/uL (ref 3.80–5.20)
RDW: 15.7 % — AB (ref 11.5–14.5)
WBC: 8.3 10*3/uL (ref 3.6–11.0)

## 2016-06-09 LAB — RPR: RPR: NONREACTIVE

## 2016-06-09 MED ORDER — IBUPROFEN 600 MG PO TABS
600.0000 mg | ORAL_TABLET | Freq: Four times a day (QID) | ORAL | Status: DC
  Administered 2016-06-09: 600 mg via ORAL

## 2016-06-09 MED ORDER — ONDANSETRON HCL 4 MG/2ML IJ SOLN: 4.0000 mg | INTRAMUSCULAR | Status: DC | PRN

## 2016-06-09 MED ORDER — BUTORPHANOL TARTRATE 1 MG/ML IJ SOLN
1.0000 mg | INTRAMUSCULAR | Status: DC | PRN
  Administered 2016-06-09 (×2): 1 mg via INTRAVENOUS
  Filled 2016-06-09 (×3): qty 2

## 2016-06-09 MED ORDER — OXYCODONE HCL 5 MG PO TABS
ORAL_TABLET | ORAL | Status: AC
  Filled 2016-06-09: qty 2

## 2016-06-09 MED ORDER — WITCH HAZEL-GLYCERIN EX PADS: 1.0000 "application " | MEDICATED_PAD | CUTANEOUS | Status: DC | PRN

## 2016-06-09 MED ORDER — BUTORPHANOL TARTRATE 2 MG/ML IJ SOLN
2.0000 mg | INTRAMUSCULAR | Status: DC | PRN
  Administered 2016-06-09: 2 mg via INTRAVENOUS

## 2016-06-09 MED ORDER — BUTORPHANOL TARTRATE 2 MG/ML IJ SOLN
INTRAMUSCULAR | Status: AC
  Filled 2016-06-09: qty 2

## 2016-06-09 MED ORDER — SENNOSIDES-DOCUSATE SODIUM 8.6-50 MG PO TABS
2.0000 | ORAL_TABLET | ORAL | Status: DC
  Administered 2016-06-10 – 2016-06-11 (×2): 2 via ORAL
  Filled 2016-06-09 (×2): qty 2

## 2016-06-09 MED ORDER — OXYTOCIN 40 UNITS IN LACTATED RINGERS INFUSION - SIMPLE MED
1.0000 m[IU]/min | INTRAVENOUS | Status: DC
  Administered 2016-06-09: 2 m[IU]/min via INTRAVENOUS

## 2016-06-09 MED ORDER — SIMETHICONE 80 MG PO CHEW
80.0000 mg | CHEWABLE_TABLET | ORAL | Status: DC | PRN
Start: 2016-06-09 — End: 2016-06-11
  Administered 2016-06-10: 80 mg via ORAL
  Filled 2016-06-09: qty 1

## 2016-06-09 MED ORDER — BENZOCAINE-MENTHOL 20-0.5 % EX AERO
1.0000 "application " | INHALATION_SPRAY | CUTANEOUS | Status: DC | PRN
  Filled 2016-06-09 (×2): qty 56

## 2016-06-09 MED ORDER — COCONUT OIL OIL
1.0000 "application " | TOPICAL_OIL | Status: DC | PRN
  Administered 2016-06-10: 1 via TOPICAL
  Filled 2016-06-09: qty 120

## 2016-06-09 MED ORDER — LIDOCAINE HCL (PF) 1 % IJ SOLN
INTRAMUSCULAR | Status: DC | PRN
  Administered 2016-06-09: 3 mL

## 2016-06-09 MED ORDER — OXYCODONE HCL 5 MG PO TABS
5.0000 mg | ORAL_TABLET | ORAL | Status: DC | PRN
  Administered 2016-06-10 – 2016-06-11 (×2): 5 mg via ORAL
  Filled 2016-06-09 (×3): qty 1

## 2016-06-09 MED ORDER — ONDANSETRON HCL 4 MG PO TABS: 4.0000 mg | ORAL_TABLET | ORAL | Status: DC | PRN

## 2016-06-09 MED ORDER — FENTANYL 2.5 MCG/ML W/ROPIVACAINE 0.2% IN NS 100 ML EPIDURAL INFUSION (ARMC-ANES)
EPIDURAL | Status: DC | PRN
  Administered 2016-06-09: 10 mL/h via EPIDURAL

## 2016-06-09 MED ORDER — IBUPROFEN 600 MG PO TABS
600.0000 mg | ORAL_TABLET | Freq: Four times a day (QID) | ORAL | Status: DC
  Administered 2016-06-10 – 2016-06-11 (×6): 600 mg via ORAL
  Filled 2016-06-09 (×7): qty 1

## 2016-06-09 MED ORDER — FENTANYL 2.5 MCG/ML W/ROPIVACAINE 0.2% IN NS 100 ML EPIDURAL INFUSION (ARMC-ANES)
EPIDURAL | Status: AC
  Filled 2016-06-09: qty 100

## 2016-06-09 MED ORDER — DIBUCAINE 1 % RE OINT: 1.0000 "application " | TOPICAL_OINTMENT | RECTAL | Status: DC | PRN

## 2016-06-09 MED ORDER — LIDOCAINE-EPINEPHRINE (PF) 1.5 %-1:200000 IJ SOLN
INTRAMUSCULAR | Status: DC | PRN
  Administered 2016-06-09: 3 mL via PERINEURAL

## 2016-06-09 MED ORDER — BUPIVACAINE HCL (PF) 0.25 % IJ SOLN
INTRAMUSCULAR | Status: DC | PRN
  Administered 2016-06-09 (×2): 4 mL via EPIDURAL

## 2016-06-09 MED ORDER — TERBUTALINE SULFATE 1 MG/ML IJ SOLN: 0.2500 mg | Freq: Once | INTRAMUSCULAR | Status: DC | PRN

## 2016-06-09 MED ORDER — IBUPROFEN 600 MG PO TABS
ORAL_TABLET | ORAL | Status: AC
  Filled 2016-06-09: qty 1

## 2016-06-09 MED ORDER — SENNOSIDES-DOCUSATE SODIUM 8.6-50 MG PO TABS: 2.0000 | ORAL_TABLET | ORAL | Status: DC

## 2016-06-09 MED ORDER — PRENATAL MULTIVITAMIN CH
1.0000 | ORAL_TABLET | Freq: Every day | ORAL | Status: DC
  Administered 2016-06-10 – 2016-06-11 (×2): 1 via ORAL
  Filled 2016-06-09 (×2): qty 1

## 2016-06-09 MED ORDER — FERROUS SULFATE 325 (65 FE) MG PO TABS
325.0000 mg | ORAL_TABLET | Freq: Every day | ORAL | Status: DC
  Administered 2016-06-10 – 2016-06-11 (×2): 325 mg via ORAL
  Filled 2016-06-09 (×2): qty 1

## 2016-06-09 MED ORDER — STERILE WATER FOR INJECTION IJ SOLN
INTRAMUSCULAR | Status: AC
  Filled 2016-06-09: qty 50

## 2016-06-09 MED ORDER — OXYCODONE HCL 5 MG PO TABS
10.0000 mg | ORAL_TABLET | ORAL | Status: DC | PRN
  Administered 2016-06-09 – 2016-06-10 (×4): 10 mg via ORAL
  Filled 2016-06-09 (×3): qty 2

## 2016-06-09 NOTE — Anesthesia Preprocedure Evaluation (Signed)
Anesthesia Evaluation  Patient identified by MRN, date of birth, ID band Patient awake    Reviewed: Allergy & Precautions, H&P , NPO status , Patient's Chart, lab work & pertinent test results  History of Anesthesia Complications Negative for: history of anesthetic complications  Airway Mallampati: II  TM Distance: >3 FB Neck ROM: full    Dental  (+) Poor Dentition   Pulmonary Current Smoker,    Pulmonary exam normal        Cardiovascular hypertension, Normal cardiovascular exam     Neuro/Psych PSYCHIATRIC DISORDERS Anxiety  Depressionnegative neurological ROS     GI/Hepatic Neg liver ROS, GERD  ,  Endo/Other  negative endocrine ROS  Renal/GU negative Renal ROS  negative genitourinary   Musculoskeletal   Abdominal   Peds  Hematology negative hematology ROS (+)   Anesthesia Other Findings   Reproductive/Obstetrics (+) Pregnancy                             Anesthesia Physical Anesthesia Plan  ASA: III  Anesthesia Plan: Epidural   Post-op Pain Management:    Induction:   Airway Management Planned:   Additional Equipment:   Intra-op Plan:   Post-operative Plan:   Informed Consent: I have reviewed the patients History and Physical, chart, labs and discussed the procedure including the risks, benefits and alternatives for the proposed anesthesia with the patient or authorized representative who has indicated his/her understanding and acceptance.     Plan Discussed with: Anesthesiologist  Anesthesia Plan Comments:         Anesthesia Quick Evaluation

## 2016-06-09 NOTE — Progress Notes (Signed)
L&D Progress Note  31 year old G5 P3013 admitted last night with gestational hypertension for IOL.  Blood pressures in the mild range last night. Has been normotensive today. PIH labs were also normal, except for mild anemia. IOL began last night with Cytotec. Last dose was 0640 this AM. Was having some contractions from Cytotec, but difficulty picking them up with toco. Cervix has progressed from 0.5 cm to 1.5cm at last check with Cytotec insertion.  Patient complains of a headache this AM. Stadol just starting to help it. Has been started on GBS PPX (penicillin)  General: gravid, obese, WF, trying to nap  Temp:  [97.9 F (36.6 C)-98.3 F (36.8 C)] 97.9 F (36.6 C) (06/01 0759) Pulse Rate:  [72-85] 74 (06/01 0759) Resp:  [16-20] 18 (06/01 0239) BP: (99-154)/(55-94) 131/85 (06/01 0759) Weight:  [119.7 kg (264 lb)] 119.7 kg (264 lb) (05/31 1051)   Currently: BP 131/85   Pulse 74   Temp 97.9 F (36.6 C) (Oral)   Resp 18   Ht 5\' 3"  (1.6 m)   Wt 119.7 kg (264 lb)   LMP 09/03/2015 (Exact Date)   BMI 46.77 kg/m    FHR: 135 baseline with accelerations to 150s, moderate variability Toco: q 2-3 min apart, mild per patient  Cervix: 1-2/50%/-3/ vertex on ultrasound 30cc Foley bulb inserted around 9:40 AM this morning  A: IUP at 40 weeks with gestational hypertension IOL in progress FWB: Cat 1 tracing  P: Start Pitocin augmentation after patient eats a little breakfast. Does desire epidural when progressing. Will repeat CBC at noon Monitor progress, blood pressures, and fetal well being closely  Farrel Connersolleen Jo Booze, CNM

## 2016-06-09 NOTE — Anesthesia Procedure Notes (Signed)
Epidural Patient location during procedure: OB Start time: 06/09/2016 3:30 PM End time: 06/09/2016 3:59 PM  Staffing Anesthesiologist: Naomie DeanKEPHART, WILLIAM K Resident/CRNA: Stormy FabianURTIS, Siyana Performed: resident/CRNA   Preanesthetic Checklist Completed: patient identified, site marked, surgical consent, pre-op evaluation, timeout performed, IV checked, risks and benefits discussed and monitors and equipment checked  Epidural Patient position: sitting Prep: Betadine Patient monitoring: heart rate, continuous pulse ox and blood pressure Approach: midline Location: L3-L4 Injection technique: LOR saline  Needle:  Needle type: Tuohy  Needle gauge: 17 G Needle length: 9 cm and 9 Needle insertion depth: 9 cm Catheter type: closed end flexible Catheter size: 19 Gauge Catheter at skin depth: 14 cm Test dose: negative and 1.5% lidocaine with Epi 1:200 K  Assessment Sensory level: T10 Events: blood not aspirated, injection not painful, no injection resistance, negative IV test and no paresthesia  Additional Notes Pt. Evaluated and documentation done after procedure finished. Patient identified. Risks/Benefits/Options discussed with patient including but not limited to bleeding, infection, nerve damage, paralysis, failed block, incomplete pain control, headache, blood pressure changes, nausea, vomiting, reactions to medication both or allergic, itching and postpartum back pain. Confirmed with bedside nurse the patient's most recent platelet count. Confirmed with patient that they are not currently taking any anticoagulation, have any bleeding history or any family history of bleeding disorders. Patient expressed understanding and wished to proceed. All questions were answered. Sterile technique was used throughout the entire procedure. Please see nursing notes for vital signs. Test dose was given through epidural catheter and negative prior to continuing to dose epidural or start infusion. Warning signs  of high block given to the patient including shortness of breath, tingling/numbness in hands, complete motor block, or any concerning symptoms with instructions to call for help. Patient was given instructions on fall risk and not to get out of bed. All questions and concerns addressed with instructions to call with any issues or inadequate analgesia.   Patient tolerated the insertion well without immediate complications.Reason for block:procedure for pain

## 2016-06-10 LAB — CBC
HEMATOCRIT: 25.6 % — AB (ref 35.0–47.0)
HEMOGLOBIN: 8.6 g/dL — AB (ref 12.0–16.0)
MCH: 27.1 pg (ref 26.0–34.0)
MCHC: 33.6 g/dL (ref 32.0–36.0)
MCV: 80.8 fL (ref 80.0–100.0)
Platelets: 137 10*3/uL — ABNORMAL LOW (ref 150–440)
RBC: 3.17 MIL/uL — ABNORMAL LOW (ref 3.80–5.20)
RDW: 15.4 % — ABNORMAL HIGH (ref 11.5–14.5)
WBC: 8.5 10*3/uL (ref 3.6–11.0)

## 2016-06-10 NOTE — Progress Notes (Signed)
Subjective:  Doing well, moderate lochia, no headaches, vision changes, RUQ or epigastric pain. Tolerating po.    Objective:   Blood pressure 121/68, pulse 80, temperature 98.3 F (36.8 C), temperature source Oral, resp. rate 16, height 5' 3"  (1.6 m), weight 264 lb (119.7 kg), last menstrual period 09/03/2015, SpO2 99 %, unknown if currently breastfeeding.  General: NAD Pulmonary: no increased work of breathing Abdomen: non-distended, non-tender, fundus firm at level of umbilicus Extremities: no edema, no erythema, no tenderness  Results for orders placed or performed during the hospital encounter of 06/08/16 (from the past 72 hour(s))  Protein / creatinine ratio, urine     Status: None   Collection Time: 06/08/16 11:13 AM  Result Value Ref Range   Creatinine, Urine 250 mg/dL   Total Protein, Urine 25 mg/dL    Comment: NO NORMAL RANGE ESTABLISHED FOR THIS TEST   Protein Creatinine Ratio 0.10 0.00 - 0.15 mg/mg[Cre]  Type and screen [place]     Status: None   Collection Time: 06/08/16 11:27 AM  Result Value Ref Range   ABO/RH(D) A POS    Antibody Screen NEG    Sample Expiration 06/11/2016   CBC with Differential/Platelet     Status: Abnormal   Collection Time: 06/08/16 11:27 AM  Result Value Ref Range   WBC 8.5 3.6 - 11.0 K/uL   RBC 3.84 3.80 - 5.20 MIL/uL   Hemoglobin 10.6 (L) 12.0 - 16.0 g/dL   HCT 31.0 (L) 35.0 - 47.0 %   MCV 80.9 80.0 - 100.0 fL   MCH 27.7 26.0 - 34.0 pg   MCHC 34.3 32.0 - 36.0 g/dL   RDW 15.7 (H) 11.5 - 14.5 %   Platelets 158 150 - 440 K/uL   Neutrophils Relative % 80 %   Neutro Abs 6.8 (H) 1.4 - 6.5 K/uL   Lymphocytes Relative 14 %   Lymphs Abs 1.2 1.0 - 3.6 K/uL   Monocytes Relative 5 %   Monocytes Absolute 0.4 0.2 - 0.9 K/uL   Eosinophils Relative 0 %   Eosinophils Absolute 0.0 0 - 0.7 K/uL   Basophils Relative 1 %   Basophils Absolute 0.1 0 - 0.1 K/uL  Comprehensive metabolic panel     Status: Abnormal   Collection  Time: 06/08/16 11:27 AM  Result Value Ref Range   Sodium 135 135 - 145 mmol/L   Potassium 4.1 3.5 - 5.1 mmol/L   Chloride 105 101 - 111 mmol/L   CO2 24 22 - 32 mmol/L   Glucose, Bld 77 65 - 99 mg/dL   BUN 8 6 - 20 mg/dL   Creatinine, Ser 0.68 0.44 - 1.00 mg/dL   Calcium 8.9 8.9 - 10.3 mg/dL   Total Protein 6.7 6.5 - 8.1 g/dL   Albumin 3.0 (L) 3.5 - 5.0 g/dL   AST 17 15 - 41 U/L   ALT 17 14 - 54 U/L   Alkaline Phosphatase 200 (H) 38 - 126 U/L   Total Bilirubin 0.5 0.3 - 1.2 mg/dL   GFR calc non Af Amer >60 >60 mL/min   GFR calc Af Amer >60 >60 mL/min    Comment: (NOTE) The eGFR has been calculated using the CKD EPI equation. This calculation has not been validated in all clinical situations. eGFR's persistently <60 mL/min signify possible Chronic Kidney Disease.    Anion gap 6 5 - 15  RPR     Status: None   Collection Time: 06/08/16 11:27 AM  Result Value  Ref Range   RPR Ser Ql Non Reactive Non Reactive    Comment: (NOTE) Performed At: [facility] [place], [place] [id number] Lindon Romp MD PJ:[id number]   CBC     Status: Abnormal   Collection Time: 06/09/16 12:31 PM  Result Value Ref Range   WBC 8.3 3.6 - 11.0 K/uL   RBC 3.89 3.80 - 5.20 MIL/uL   Hemoglobin 10.6 (L) 12.0 - 16.0 g/dL   HCT 31.3 (L) 35.0 - 47.0 %   MCV 80.4 80.0 - 100.0 fL   MCH 27.3 26.0 - 34.0 pg   MCHC 33.9 32.0 - 36.0 g/dL   RDW 15.7 (H) 11.5 - 14.5 %   Platelets 139 (L) 150 - 440 K/uL  CBC     Status: Abnormal   Collection Time: 06/10/16  6:09 AM  Result Value Ref Range   WBC 8.5 3.6 - 11.0 K/uL   RBC 3.17 (L) 3.80 - 5.20 MIL/uL   Hemoglobin 8.6 (L) 12.0 - 16.0 g/dL   HCT 25.6 (L) 35.0 - 47.0 %   MCV 80.8 80.0 - 100.0 fL   MCH 27.1 26.0 - 34.0 pg   MCHC 33.6 32.0 - 36.0 g/dL   RDW 15.4 (H) 11.5 - 14.5 %   Platelets 137 (L) 150 - 440 K/uL    Assessment:   31 y.o. [id number] postpartum day # 1 TSVD, GHTN  Plan:    1) Acute blood loss anemia - hemodynamically  stable and asymptomatic - po ferrous sulfate  2) Blood Type --/--/A POS (05/31 1127) / Rubella Immune (10/12 0000) / Varicella Immune  3) TDAP status UTD 04/12/16  4) Breast & Bottle / Interval BTL  5) GHTN - BP improved postpartum with mostly normotensive to mild range readings  6) Disposition anticipated discharge PPD2

## 2016-06-10 NOTE — Discharge Summary (Addendum)
Physician Obstetric Discharge Summary  Patient ID: Holly Nicholson MRN: [id number] DOB/AGE: 1985/03/14 31 y.o.   Date of Admission: 06/08/2016  Date of Discharge:   Admitting Diagnosis: Gestational hypertension at [redacted]w[redacted]d  Secondary Diagnosis: Obesity in pregnancy with BMI 46  Mode of Delivery: normal spontaneous vaginal delivery 06/09/2016      Discharge Diagnosis: Gestational hypertension and term intrauterine pregnancy delivered   Intrapartum Procedures: Atificial rupture of membranes, epidural, GBS prophylaxis, pitocin augmentation, placement of fetal scalp electrode, placement of intrauterine catheter and Cytotec induction, foley bulb induction, repair of second degree perineal laceration   Post partum procedures: none  Complications: none   Brief Hospital Course  Holly Nicholson is a [id number] who had a SVD of a female infant  on 06/09/2016 after a two day induction for gestational hypertension;  for further details of this delivery, please refer to the delivery note.  Patient had an uncomplicated postpartum course.  By time of discharge on PPD#2, her pain was controlled on oral pain medications; she had appropriate lochia and was ambulating, voiding without difficulty and tolerating regular diet.  She was deemed stable for discharge to home.    Labs: CBC Latest Ref Rng & Units 06/10/2016 06/09/2016 06/08/2016  WBC 3.6 - 11.0 K/uL 8.5 8.3 8.5  Hemoglobin 12.0 - 16.0 g/dL 1.3(Y) 10.6(L) 10.6(L)  Hematocrit 35.0 - 47.0 % 25.6(L) 31.3(L) 31.0(L)  Platelets 150 - 440 K/uL 137(L) 139(L) 158   A POS  Physical exam:  Blood pressure 115/66, pulse 90, temperature 98.1 F (36.7 C), temperature source Oral, resp. rate 18, height 5\' 3"  (1.6 m), weight 264 lb (119.7 kg), last menstrual period 09/03/2015, SpO2 98 %, unknown if currently breastfeeding. General: alert and no distress Lochia: appropriate Abdomen: soft, NT Uterine Fundus: firm Extremities: No evidence of DVT seen on physical  exam. No lower extremity edema.  Discharge Instructions: Per After Visit Summary. Activity: Advance as tolerated. Pelvic rest for 6 weeks.  Also refer to Discharge Instructions Diet: Regular Medications: Allergies as of 06/11/2016      Reactions   Latex Dermatitis      Medication List    STOP taking these medications   ondansetron 8 MG tablet Commonly known as:  ZOFRAN     TAKE these medications   cetirizine 10 MG tablet Commonly known as:  ZYRTEC TAKE 1 TABLET (10 MG TOTAL) BY MOUTH DAILY AS NEEDED FOR ALLERGIES.   ibuprofen 600 MG tablet Commonly known as:  ADVIL,MOTRIN Take 1 tablet (600 mg total) by mouth every 6 (six) hours.   Prenatal Vitamins 0.8 MG tablet Take 1 tablet by mouth daily.   ranitidine 75 MG tablet Commonly known as:  ZANTAC Take 75 mg by mouth every morning.      Outpatient follow up:  Follow-up Information    Vena Austria, MD. Schedule an appointment as soon as possible for a visit today.   Specialty:  Obstetrics and Gynecology Why:  Make appointment in 4 weeks for postpartum check and to schedule interval tubal/salpingectomy. Also needs appointment in 1 week for mood/depression check with Farrel Conners Contact information: [address] [place] [address] [phone]        Farrel Conners, CNM. Schedule an appointment as soon as possible for a visit in 1 week(s).   Specialty:  Certified Nurse Midwife Why:  call and schedule an appointment to be seen in 1 week for BP check Contact information: [address] [place] [address] [phone]  Postpartum contraception: plans for a salpingectomy at 6 weeks postpartum. Has had prior tubal and reversal Follow up in 4 weeks with surgeon to schedule salpingectomy Discharged Condition: good  Discharged to: home   Newborn Data: Disposition:home with mother Weight: 7#14.6oz Apgars: APGAR (1 MIN): 8   APGAR (5 MINS): 9   APGAR (10 MINS):    Baby  Feeding: Bottle and Breast/ Holly Nicholson  Vena AustriaAndreas Shelie Lansing, MD 06/11/2016 1:09 PM

## 2016-06-10 NOTE — Anesthesia Postprocedure Evaluation (Signed)
Anesthesia Post Note  Patient: Holly Nicholson  Procedure(s) Performed: * No procedures listed *  Patient location during evaluation: Mother Baby Anesthesia Type: Epidural Level of consciousness: awake and alert and oriented Pain management: pain level controlled Vital Signs Assessment: post-procedure vital signs reviewed and stable Respiratory status: spontaneous breathing, nonlabored ventilation and respiratory function stable Cardiovascular status: stable Postop Assessment: no headache, no backache, epidural receding and no signs of nausea or vomiting (no pruritis) Anesthetic complications: no     Last Vitals:  Vitals:   06/10/16 1145 06/10/16 1200  BP: (!) 91/55 (!) 111/54  Pulse: 75   Resp:    Temp: 36.4 C     Last Pain:  Vitals:   06/10/16 1157  TempSrc:   PainSc: 3                  [name]

## 2016-06-10 NOTE — Discharge Instructions (Signed)
Vaginal Delivery, Care After Refer to this sheet in the next few weeks. These discharge instructions provide you with information on caring for yourself after delivery. Your caregiver may also give you specific instructions. Your treatment has been planned according to the most current medical practices available, but problems sometimes occur. Call your caregiver if you have any problems or questions after you go home. HOME CARE INSTRUCTIONS 1. Take over-the-counter or prescription medicines only as directed by your caregiver or pharmacist. 2. Do not drink alcohol, especially if you are breastfeeding or taking medicine to relieve pain. 3. Do not smoke tobacco. 4. Continue to use good perineal care. Good perineal care includes: 1. Wiping your perineum from back to front 2. Keeping your perineum clean. 3. You can do sitz baths twice a day, to help keep this area clean 5. Do not use tampons, douche or have sex until your caregiver says it is okay. 6. Shower only and avoid sitting in submerged water, aside from sitz baths 7. Wear a well-fitting bra that provides breast support. 8. Eat healthy foods. 9. Drink enough fluids to keep your urine clear or pale yellow. 10. Eat high-fiber foods such as whole grain cereals and breads, brown rice, beans, and fresh fruits and vegetables every day. These foods may help prevent or relieve constipation. 11. Avoid constipation with high fiber foods or medications, such as miralax or metamucil 12. Follow your caregiver's recommendations regarding resumption of activities such as climbing stairs, driving, lifting, exercising, or traveling. 13. Talk to your caregiver about resuming sexual activities. Resumption of sexual activities is dependent upon your risk of infection, your rate of healing, and your comfort and desire to resume sexual activity. 14. Try to have someone help you with your household activities and your newborn for at least a few days after you leave  the hospital. 15. Rest as much as possible. Try to rest or take a nap when your newborn is sleeping. 16. Increase your activities gradually. 17. Keep all of your scheduled postpartum appointments. It is very important to keep your scheduled follow-up appointments. At these appointments, your caregiver will be checking to make sure that you are healing physically and emotionally. SEEK MEDICAL CARE IF:   You are passing large clots from your vagina. Save any clots to show your caregiver.  You have a foul smelling discharge from your vagina.  You have trouble urinating.  You are urinating frequently.  You have pain when you urinate.  You have a change in your bowel movements.  You have increasing redness, pain, or swelling near your vaginal incision (episiotomy) or vaginal tear.  You have pus draining from your episiotomy or vaginal tear.  Your episiotomy or vaginal tear is separating.  You have painful, hard, or reddened breasts.  You have a severe headache.  You have blurred vision or see spots.  You feel sad or depressed.  You have thoughts of hurting yourself or your newborn.  You have questions about your care, the care of your newborn, or medicines.  You are dizzy or light-headed.  You have a rash.  You have nausea or vomiting.  You were breastfeeding and have not had a menstrual period within 12 weeks after you stopped breastfeeding.  You are not breastfeeding and have not had a menstrual period by the 12th week after delivery.  You have a fever. SEEK IMMEDIATE MEDICAL CARE IF:   You have persistent pain.  You have chest pain.  You have shortness of breath.    You faint.  You have leg pain.  You have stomach pain.  Your vaginal bleeding saturates two or more sanitary pads in 1 hour. MAKE SURE YOU:   Understand these instructions.  Will watch your condition.  Will get help right away if you are not doing well or get worse. Document Released:  12/24/1999 Document Revised: 05/12/2013 Document Reviewed: 08/23/2011 ExitCare Patient Information 2015 [employer]. This information is not intended to replace advice given to you by your health care provider. Make sure you discuss any questions you have with your health care provider.  Sitz Bath A sitz bath is a warm water bath taken in the sitting position. The water covers only the hips and butt (buttocks). We recommend using one that fits in the toilet, to help with ease of use and cleanliness. It may be used for either healing or cleaning purposes. Sitz baths are also used to relieve pain, itching, or muscle tightening (spasms). The water may contain medicine. Moist heat will help you heal and relax.  HOME CARE  Take 3 to 4 sitz baths a day. 18. Fill the bathtub half-full with warm water. 19. Sit in the water and open the drain a little. 20. Turn on the warm water to keep the tub half-full. Keep the water running constantly. 21. Soak in the water for 15 to 20 minutes. 22. After the sitz bath, pat the affected area dry. GET HELP RIGHT AWAY IF: You get worse instead of better. Stop the sitz baths if you get worse. MAKE SURE YOU:  Understand these instructions.  Will watch your condition.  Will get help right away if you are not doing well or get worse. Document Released: 02/03/2004 Document Revised: 09/20/2011 Document Reviewed: 04/25/2010 ExitCare Patient Information 2015 [employer]. This information is not intended to replace advice given to you by your health care provider. Make sure you discuss any questions you have with your health care provider.    

## 2016-06-11 MED ORDER — IBUPROFEN 600 MG PO TABS: 600.0000 mg | ORAL_TABLET | Freq: Four times a day (QID) | ORAL | 0 refills | Status: DC

## 2016-06-11 NOTE — Progress Notes (Signed)
Patient discharged home with infant and family. Discharge instructions, prescriptions and follow up appointment given to and reviewed with patient and family. Patient verbalized understanding. Escorted out via wheelchair by nursing staff.  

## 2016-06-23 ENCOUNTER — Ambulatory Visit (INDEPENDENT_AMBULATORY_CARE_PROVIDER_SITE_OTHER): Payer: Medicaid Other | Admitting: Certified Nurse Midwife

## 2016-06-23 VITALS — BP 112/66 | Wt 238.0 lb

## 2016-06-23 DIAGNOSIS — R6889 Other general symptoms and signs: Secondary | ICD-10-CM | POA: Diagnosis not present

## 2016-06-23 DIAGNOSIS — O9081 Anemia of the puerperium: Secondary | ICD-10-CM

## 2016-06-23 DIAGNOSIS — O133 Gestational [pregnancy-induced] hypertension without significant proteinuria, third trimester: Secondary | ICD-10-CM | POA: Diagnosis not present

## 2016-06-24 LAB — TSH: TSH: 1.18 u[IU]/mL (ref 0.450–4.500)

## 2016-06-24 LAB — CBC WITH DIFFERENTIAL/PLATELET
Basophils Absolute: 0 10*3/uL (ref 0.0–0.2)
Basos: 1 %
EOS (ABSOLUTE): 0 10*3/uL (ref 0.0–0.4)
Eos: 0 %
Hematocrit: 23.2 % — ABNORMAL LOW (ref 34.0–46.6)
Hemoglobin: 7.4 g/dL — ABNORMAL LOW (ref 11.1–15.9)
IMMATURE GRANULOCYTES: 1 %
Immature Grans (Abs): 0 10*3/uL (ref 0.0–0.1)
Lymphocytes Absolute: 0.9 10*3/uL (ref 0.7–3.1)
Lymphs: 17 %
MCH: 25.4 pg — ABNORMAL LOW (ref 26.6–33.0)
MCHC: 31.9 g/dL (ref 31.5–35.7)
MCV: 80 fL (ref 79–97)
Monocytes Absolute: 0.3 10*3/uL (ref 0.1–0.9)
Monocytes: 6 %
NEUTROS PCT: 75 %
Neutrophils Absolute: 4.1 10*3/uL (ref 1.4–7.0)
PLATELETS: 202 10*3/uL (ref 150–379)
RBC: 2.91 x10E6/uL — AB (ref 3.77–5.28)
RDW: 16.4 % — ABNORMAL HIGH (ref 12.3–15.4)
WBC: 5.4 10*3/uL (ref 3.4–10.8)

## 2016-06-25 NOTE — Progress Notes (Signed)
Obstetrics & Gynecology Office Visit   Chief Complaint:  Chief Complaint  Patient presents with  . Postpartum Care    2 wk pp visit     History of Present Illness: 31  year old G5 P57 WF now 2 weeks postpartum who presents for a postpartum visit to check hemoglobin, blood pressure and for signs of depression. Patient had a SVD after an IOL for gestational hypertension. Her postpartum hemoglobin was 8.6gm/dl. Her pregnancy was complicated by obesity and bipolar disorder (depression) as well as anxiety. Her bleeding is appropriate. She denies fever or lightheadedness but feels cold all the time and having sweats at night.  Her baby Serenity is bottle feeding.  After the delivery of her son in 2015 she had PPD and saw a therapist at [facility]. Her EPDS today is 75 and reveals symptoms of both depression and anxiety. She is not suicidal, delusional, or homocidal.    Review of Systems:  Review of Systems  Constitutional: Negative for chills and fever.  Respiratory: Negative for shortness of breath.   Cardiovascular: Negative for chest pain and palpitations.       No lightheadedness  Gastrointestinal: Negative for abdominal pain.  Genitourinary: Negative for dysuria.  Skin: Negative for rash.  Psychiatric/Behavioral: Positive for depression. Negative for suicidal ideas. The patient is nervous/anxious.      Past Medical History:  Past Medical History:  Diagnosis Date  . Anxiety   . BMI 45.0-49.9, adult (HCC)   . Depression   . Elevated blood pressure   . History of bilateral tubal ligation 2007  . History of bilateral tubal ligation 2007  . History of reversal of tubal ligation   . Low-lying placenta   . Tubal pregnancy Aug 2016    Past Surgical History:  Past Surgical History:  Procedure Laterality Date  . CHOLECYSTECTOMY N/A 09/02/2015   Procedure: LAPAROSCOPIC CHOLECYSTECTOMY;  Surgeon: Leafy Ro, MD;  Location: [facility] ORS;  Service: General;  Laterality: N/A;    . KNEE SURGERY    . TUBAL LIGATION    . Tubal reanastamosis       Obstetric History:  OB History  Gravida Para Term Preterm AB Living  5 4 4   1 4   SAB TAB Ectopic Multiple Live Births      1 0 4    # Outcome Date GA Lbr Len/2nd Weight Sex Delivery Anes PTL Lv  5 Term 06/09/16 [redacted]w[redacted]d 439:42 / 00:10 7 lb 14.6 oz (3.59 kg) F Vag-Spont EPI, Local  LIV     Birth Comments: none noted at delivery  4 Term 09/30/13 [redacted]w[redacted]d  8 lb 4 oz (3.742 kg) M Vag-Spont   LIV  3 Term 12/15/04   7 lb 14 oz (3.572 kg) F Vag-Spont   LIV  2 Term 01/07/03 [redacted]w[redacted]d  7 lb 12 oz (3.515 kg) M Vag-Spont   LIV  1 Ectopic               Family History:  Family History  Problem Relation Age of Onset  . Heart disease Mother   . Hyperlipidemia Mother   . Hypertension Mother   . Diabetes Father   . Heart disease Father   . Hyperlipidemia Father   . Hypertension Father   . Stroke Father   . Cancer Maternal Grandfather        lung  . Cancer Paternal Grandmother        stomach  . Epilepsy Daughter   . Asthma Son   .  Stroke Paternal Grandfather     Social History:  Social History   Social History  . Marital status: Married    Spouse name: N/A  . Number of children: 4  . Years of education: N/A   Occupational History  . Not on file.   Social History Main Topics  . Smoking status: Current Every Day Smoker    Packs/day: 0.25    Years: 15.00    Types: Cigarettes    Last attempt to quit: 10/18/2015  . Smokeless tobacco: Never Used  . Alcohol use No  . Drug use: No  . Sexual activity: Yes   Other Topics Concern  . Not on file   Social History Narrative  . No narrative on file    Allergies:  Allergies  Allergen Reactions  . Latex Dermatitis    Medications: Prior to Admission medications   Medication Sig Start Date End Date Taking? Authorizing Provider  cetirizine (ZYRTEC) 10 MG tablet TAKE 1 TABLET (10 MG TOTAL) BY MOUTH DAILY AS NEEDED FOR ALLERGIES. 04/12/16   [provider]   ibuprofen (ADVIL,MOTRIN) 600 MG tablet Take 1 tablet (600 mg total) by mouth every 6 (six) hours. Patient not taking: Reported on 06/23/2016 06/11/16   Vena AustriaStaebler, Andreas, MD  Prenatal Multivit-Min-Fe-FA (PRENATAL VITAMINS) 0.8 MG tablet Take 1 tablet by mouth daily. Patient not taking: Reported on 06/23/2016 10/17/15   Myrna BlazerSchaevitz, David Matthew, MD  ranitidine (ZANTAC) 75 MG tablet Take 75 mg by mouth every morning.    [provider]    Physical Exam Vitals:BP 112/66   Wt 238 lb (108 kg)   BMI 42.16 kg/m  Temp=98.1F  Physical Exam  Constitutional: She is oriented to person, place, and time. She appears well-developed and well-nourished. No distress.  Cardiovascular: Normal rate, regular rhythm and normal heart sounds.   Respiratory: Effort normal and breath sounds normal.  Neurological: She is alert and oriented to person, place, and time.  Skin: Skin is warm and dry.  Psychiatric: She has a normal mood and affect. Her behavior is normal. Thought content normal.     Assessment: 31 y.o. [id number] 2 week postpartum follow up Anemia-no lightheadedness Gestational hypertension-normotensive Depression/ Bipolar disorder/ anxiety Cold intolerance  Plan: CBC/TSH Recommend seeing MH provider-given numbers for [facility]. Encouraged allowing friends and family to help with child care and household chores Continue iron and vitamins RTO in 2 weeks to see Dr Tiburcio PeaHarris regarding scheduling BTL/ salpingectomy  Farrel Connersolleen Shayleen Eppinger, CNM

## 2016-07-11 ENCOUNTER — Encounter: Payer: Self-pay | Admitting: Obstetrics & Gynecology

## 2016-07-11 ENCOUNTER — Telehealth: Payer: Self-pay | Admitting: Obstetrics & Gynecology

## 2016-07-11 ENCOUNTER — Ambulatory Visit (INDEPENDENT_AMBULATORY_CARE_PROVIDER_SITE_OTHER): Payer: Medicaid Other | Admitting: Obstetrics & Gynecology

## 2016-07-11 VITALS — BP 130/90 | HR 92 | Ht 62.0 in | Wt 239.0 lb

## 2016-07-11 DIAGNOSIS — Z3009 Encounter for other general counseling and advice on contraception: Secondary | ICD-10-CM | POA: Diagnosis not present

## 2016-07-11 NOTE — Telephone Encounter (Signed)
-----   Message from Nadara Mustardobert P Harris, MD sent at 07/11/2016  9:16 AM EDT ----- Regarding: surgery OCT 11 Surgery Booking Request Patient Full Name:   MRN: [id number]  DOB: 10/04/1985  Surgeon: Letitia Libraobert Paul Harris, MD  Requested Surgery Date and Time: Oct 19, 2016 Primary Diagnosis AND Code: Sterilization Z30.09 Secondary Diagnosis and Code:  Surgical Procedure: Laparoscopy Salpingectomy L&D Notification: No Admission Status: same day surgery Length of Surgery: 30 Special Case Needs: no H&P: needs (date) Phone Interview???: yes Interpreter: Language:  Medical Clearance: no Special Scheduling Instructions: no

## 2016-07-11 NOTE — Progress Notes (Signed)
  Contraception Counseling Patient presents for contraception counseling. The patient has no complaints today. The patient is not sexually active. Pertinent past medical history: recent delivery.  Prior BTL, fai;ed (2 preg since). Prior Ectopic.  Desires removal of tubes for contraception.  PMHx: She  has a past medical history of Anxiety; BMI 45.0-49.9, adult (HCC); Depression; Elevated blood pressure; History of bilateral tubal ligation (2007); History of bilateral tubal ligation (2007); History of reversal of tubal ligation; Low-lying placenta; and Tubal pregnancy (Aug 2016). Also,  has a past surgical history that includes Knee surgery; Tubal ligation; Cholecystectomy (N/A, 09/02/2015); and Tubal ligation., family history includes Asthma in her son; Cancer in her maternal grandfather and paternal grandmother; Diabetes in her father; Epilepsy in her daughter; Heart disease in her father and mother; Hyperlipidemia in her father and mother; Hypertension in her father and mother; Stroke in her father and paternal grandfather.,  reports that she has been smoking Cigarettes.  She has a 3.75 pack-year smoking history. She has never used smokeless tobacco. She reports that she does not drink alcohol or use drugs.  She has a current medication list which includes the following prescription(s): cetirizine, ibuprofen, prenatal vitamins, and ranitidine. Also, is allergic to latex.  Review of Systems  Constitutional: Negative for chills, fever and malaise/fatigue.  HENT: Negative for congestion, sinus pain and sore throat.   Eyes: Negative for blurred vision and pain.  Respiratory: Negative for cough and wheezing.   Cardiovascular: Negative for chest pain and leg swelling.  Gastrointestinal: Negative for abdominal pain, constipation, diarrhea, heartburn, nausea and vomiting.  Genitourinary: Negative for dysuria, frequency, hematuria and urgency.  Musculoskeletal: Negative for back pain, joint pain, myalgias  and neck pain.  Skin: Negative for itching and rash.  Neurological: Negative for dizziness, tremors and weakness.  Endo/Heme/Allergies: Does not bruise/bleed easily.  Psychiatric/Behavioral: Negative for depression. The patient is not nervous/anxious and does not have insomnia.     Objective: BP 130/90   Pulse 92   Ht 5\' 2"  (1.575 m)   Wt 239 lb (108.4 kg)   BMI 43.71 kg/m  Physical Exam  Constitutional: She is oriented to person, place, and time. She appears well-developed and well-nourished. No distress.  Musculoskeletal: Normal range of motion.  Neurological: She is alert and oriented to person, place, and time.  Skin: Skin is warm and dry.  Psychiatric: She has a normal mood and affect.  Vitals reviewed.   ASSESSMENT/PLAN:    Problem List Items Addressed This Visit      Other   Morbid obesity (HCC)    Other Visit Diagnoses    Sterilization consult    -  Primary    Prior tubal, prior ectopic.  Risks of adhesions discussed.  Risks of surgery.  Alternatives discussed and offered.  Desires in October.  BTL consent signed today.  F/u for preop.  Also f/u for PP visit 2 weeks.  Annamarie MajorPaul Harris, MD, Merlinda FrederickFACOG [facility], [facility] 07/11/2016  9:17 AM

## 2016-07-11 NOTE — Telephone Encounter (Signed)
No answer, no v/m

## 2016-07-13 ENCOUNTER — Telehealth: Payer: Self-pay

## 2016-07-13 NOTE — Telephone Encounter (Signed)
FMLA/DISABILITY form for [employer] filled out and given to [place] for processing. 

## 2016-07-13 NOTE — Telephone Encounter (Signed)
No answer, no v/m

## 2016-07-14 ENCOUNTER — Encounter: Payer: Self-pay | Admitting: Certified Nurse Midwife

## 2016-07-14 NOTE — Telephone Encounter (Signed)
No answer, no v/m

## 2016-07-17 NOTE — Telephone Encounter (Signed)
OK 

## 2016-07-17 NOTE — Telephone Encounter (Signed)
No answer, no v/m

## 2016-07-17 NOTE — Telephone Encounter (Signed)
[facility] @ 1:30pm w/ Dr. Tiburcio PeaHarris, Pre-admit Phone Interview afterwards, and OR on 10/19/16.

## 2016-08-02 ENCOUNTER — Ambulatory Visit (INDEPENDENT_AMBULATORY_CARE_PROVIDER_SITE_OTHER): Payer: Medicaid Other | Admitting: Obstetrics & Gynecology

## 2016-08-02 MED ORDER — NORETHINDRONE 0.35 MG PO TABS: 1.0000 | ORAL_TABLET | Freq: Every day | ORAL | 11 refills | Status: DC

## 2016-08-02 NOTE — Patient Instructions (Signed)
Norethindrone tablets (contraception) What is this medicine? NORETHINDRONE (nor eth IN drone) is an oral contraceptive. The product contains a female hormone known as a progestin. It is used to prevent pregnancy. This medicine may be used for other purposes; ask your health care provider or pharmacist if you have questions. COMMON BRAND NAME(S): Camila, Deblitane 28-Day, Errin, Heather, Jencycla, Jolivette, Lyza, Nor-QD, Nora-BE, Norlyroc, Ortho Micronor, Sharobel 28-Day What should I tell my health care provider before I take this medicine? They need to know if you have any of these conditions: -blood vessel disease or blood clots -breast, cervical, or vaginal cancer -diabetes -heart disease -kidney disease -liver disease -mental depression -migraine -seizures -stroke -vaginal bleeding -an unusual or allergic reaction to norethindrone, other medicines, foods, dyes, or preservatives -pregnant or trying to get pregnant -breast-feeding How should I use this medicine? Take this medicine by mouth with a glass of water. You may take it with or without food. Follow the directions on the prescription label. Take this medicine at the same time each day and in the order directed on the package. Do not take your medicine more often than directed. Contact your pediatrician regarding the use of this medicine in children. Special care may be needed. This medicine has been used in female children who have started having menstrual periods. A patient package insert for the product will be given with each prescription and refill. Read this sheet carefully each time. The sheet may change frequently. Overdosage: If you think you have taken too much of this medicine contact a poison control center or emergency room at once. NOTE: This medicine is only for you. Do not share this medicine with others. What if I miss a dose? Try not to miss a dose. Every time you miss a dose or take a dose late your chance of  pregnancy increases. When 1 pill is missed (even if only 3 hours late), take the missed pill as soon as possible and continue taking a pill each day at the regular time (use a back up method of birth control for the next 48 hours). If more than 1 dose is missed, use an additional birth control method for the rest of your pill pack until menses occurs. Contact your health care professional if more than 1 dose has been missed. What may interact with this medicine? Do not take this medicine with any of the following medications: -amprenavir or fosamprenavir -bosentan This medicine may also interact with the following medications: -antibiotics or medicines for infections, especially rifampin, rifabutin, rifapentine, and griseofulvin, and possibly penicillins or tetracyclines -aprepitant -barbiturate medicines, such as phenobarbital -carbamazepine -felbamate -modafinil -oxcarbazepine -phenytoin -ritonavir or other medicines for HIV infection or AIDS -St. John's wort -topiramate This list may not describe all possible interactions. Give your health care provider a list of all the medicines, herbs, non-prescription drugs, or dietary supplements you use. Also tell them if you smoke, drink alcohol, or use illegal drugs. Some items may interact with your medicine. What should I watch for while using this medicine? Visit your doctor or health care professional for regular checks on your progress. You will need a regular breast and pelvic exam and Pap smear while on this medicine. Use an additional method of birth control during the first cycle that you take these tablets. If you have any reason to think you are pregnant, stop taking this medicine right away and contact your doctor or health care professional. If you are taking this medicine for hormone related problems, it   may take several cycles of use to see improvement in your condition. This medicine does not protect you against HIV infection (AIDS)  or any other sexually transmitted diseases. What side effects may I notice from receiving this medicine? Side effects that you should report to your doctor or health care professional as soon as possible: -breast tenderness or discharge -pain in the abdomen, chest, groin or leg -severe headache -skin rash, itching, or hives -sudden shortness of breath -unusually weak or tired -vision or speech problems -yellowing of skin or eyes Side effects that usually do not require medical attention (report to your doctor or health care professional if they continue or are bothersome): -changes in sexual desire -change in menstrual flow -facial hair growth -fluid retention and swelling -headache -irritability -nausea -weight gain or loss This list may not describe all possible side effects. Call your doctor for medical advice about side effects. You may report side effects to FDA at 1-800-FDA-1088. Where should I keep my medicine? Keep out of the reach of children. Store at room temperature between 15 and 30 degrees C (59 and 86 degrees F). Throw away any unused medicine after the expiration date. NOTE: This sheet is a summary. It may not cover all possible information. If you have questions about this medicine, talk to your doctor, pharmacist, or health care provider.  2018 Elsevier/Gold Standard (2011-09-15 16:41:35)  

## 2016-08-02 NOTE — Progress Notes (Signed)
  OBSTETRICS POSTPARTUM CLINIC PROGRESS NOTE  Subjective:     Holly Nicholson is a 31 y.o. [phone] female who presents for a postpartum visit. She is 6 weeks postpartum following a Term pregnancy and delivery by Vaginal, no problems at delivery.  I have fully reviewed the prenatal and intrapartum course. Anesthesia: epidural.  Postpartum course has been complicated by complicated by hypertension.  Baby is feeding by Bottle.  Bleeding: patient has not  resumed menses.  Bowel function is normal. Bladder function is normal.  Patient is not sexually active. Contraception method desired is tubal ligation.  Postpartum depression screening:some depr/anxiety. [place] 15.  The following portions of the patient's history were reviewed and updated as appropriate: allergies, current medications, past family history, past medical history, past social history, past surgical history and problem list.  Review of Systems Pertinent items are noted in HPI.  Objective:    BP (!) 148/98   Pulse 74   Ht 5\' 2"  (1.575 m)   Wt 238 lb (108 kg)   BMI 43.53 kg/m   General:  alert and no distress   Breasts:  inspection negative, no nipple discharge or bleeding, no masses or nodularity palpable  Lungs: clear to auscultation bilaterally  Heart:  regular rate and rhythm, S1, S2 normal, no murmur, click, rub or gallop  Abdomen: soft, non-tender; bowel sounds normal; no masses,  no organomegaly.     Vulva:  normal  Vagina: normal vagina, no discharge, exudate, lesion, or erythema  Cervix:  no cervical motion tenderness and no lesions  Corpus: normal size, contour, position, consistency, mobility, non-tender  Adnexa:  normal adnexa and no mass, fullness, tenderness  Rectal Exam: Not performed.          Assessment:  Post Partum Care visit 1. Postpartum care following vaginal delivery    OCP until BTL can be done (pt desires to wait until Oct)  2. Morbid obesity (HCC)  3. Desires BTL (sch in Oct) and  other BC until then  4. Hypertension, monitor and recheck  5. PAP today  6.  Does not desire tx for anx/depr at this time.  Denies dysfunction, SI, or HI.  To monitor.  Has seen psych in past and knows to return there as needed.   Plan:  See orders and Patient Instructions Contraceptive counseling for bilateral tubal ligation Resume all normal activities Follow up in: a few weeks or as needed.   Annamarie MajorPaul Harris, MD, Merlinda FrederickFACOG [facility], [facility] 08/02/2016  2:20 PM

## 2016-08-05 LAB — IGP, APTIMA HPV
HPV APTIMA: NEGATIVE
PAP SMEAR COMMENT: 0

## 2016-08-07 ENCOUNTER — Encounter: Payer: Self-pay | Admitting: Certified Nurse Midwife

## 2016-08-17 ENCOUNTER — Ambulatory Visit: Payer: Medicaid Other | Admitting: Obstetrics & Gynecology

## 2016-10-12 ENCOUNTER — Encounter: Payer: Self-pay | Admitting: Obstetrics & Gynecology

## 2016-10-12 ENCOUNTER — Ambulatory Visit (INDEPENDENT_AMBULATORY_CARE_PROVIDER_SITE_OTHER): Payer: BLUE CROSS/BLUE SHIELD | Admitting: Obstetrics & Gynecology

## 2016-10-12 VITALS — BP 120/80 | HR 83 | Ht 63.0 in | Wt 238.0 lb

## 2016-10-12 DIAGNOSIS — Z302 Encounter for sterilization: Secondary | ICD-10-CM

## 2016-10-12 DIAGNOSIS — Z01818 Encounter for other preprocedural examination: Secondary | ICD-10-CM | POA: Diagnosis not present

## 2016-10-12 NOTE — Patient Instructions (Signed)
What You Need to Know About Female Sterilization Female sterilization is surgery to prevent pregnancy. In this surgery, the fallopian tubes are either blocked or closed off. This prevents eggs from reaching the uterus so that the eggs cannot be fertilized by sperm and you cannot get pregnant. Sterilization is permanent. It should only be done if you are sure that you do not want to be able to have children. What are the sterilization surgery options? There are several kinds of female sterilization surgeries. They include:  Laparoscopic tubal ligation. In this surgery, the fallopian tubes are tied off, sealed with heat, or blocked with a clip, ring, or clamp. A small portion of each fallopian tube may also be removed. This surgery is done through several small cuts (incisions).  Postpartum tubal ligation. This is also called a mini-laparotomy. This surgery is done right after childbirth or 1 or 2 days after childbirth. In this surgery, the fallopian tubes are tied off, sealed with heat, or blocked with a clip, ring, or clamp. A small portion of each fallopian tube may also be removed. The surgery is done through a single incision.  Hysteroscopic sterilization. In this surgery, a tiny, spring-like coil is inserted through the cervix and uterus into the fallopian tubes. The coil causes scarring, which blocks the tubes. After the surgery, contraception should be used for 3 months to allow the scar tissue to form completely.  Is sterilization safe? Generally, sterilization is safe. Complications are rare. However, there are risks. They include:  Bleeding.  Infection.  Reaction to medicine used during the procedure.  Injury to surrounding organs.  Failure of the procedure.  How effective is sterilization? Sterilization is nearly 100% effective, but it can fail. Also, the fallopian tubes can grow back together over time. If this happens, you will be able to get pregnant again. Women who have had  this procedure have a higher chance of having an ectopic pregnancy. An ectopic pregnancy is a pregnancy that happens outside of the uterus. This kind of pregnancy is unsuccessful and can lead to serious bleeding if it is not treated. What are the benefits?  It is usually effective for a lifetime.  It is usually safe.  It does not have the drawbacks of other types of birth control: That means: ? Your hormones are not affected. Because of this, your menstrual periods, sexual desire, and sexual performance will not be affected. ? There are no side effects. What are the drawbacks?  If you change your mind and decide that you want to have children, you may not be able to. Sterilization may be reversed, but a reversal is not always successful.  It does not provide protection against STDs (sexually transmitted diseases).  It increases the chance of having an ectopic pregnancy. This information is not intended to replace advice given to you by your health care provider. Make sure you discuss any questions you have with your health care provider. Document Released: 06/14/2007 Document Revised: 08/19/2015 Document Reviewed: 09/22/2014 Elsevier Interactive Patient Education  2018 Elsevier Inc.  

## 2016-10-12 NOTE — Progress Notes (Signed)
PRE-OPERATIVE HISTORY AND PHYSICAL EXAM  HPI:  Holly Nicholson is a 31 y.o. [id number] Patient's last menstrual period was 09/23/2016.; she is being admitted for surgery related to requested sterilization.  Pt is done with childbearing and does not desire alternative contraception.  PMHx: Past Medical History:  Diagnosis Date  . Anxiety   . BMI 45.0-49.9, adult (HCC)   . Depression   . Elevated blood pressure   . History of bilateral tubal ligation 2007  . History of bilateral tubal ligation 2007  . History of reversal of tubal ligation   . Low-lying placenta   . Tubal pregnancy Aug 2016   Past Surgical History:  Procedure Laterality Date  . CHOLECYSTECTOMY N/A 09/02/2015   Procedure: LAPAROSCOPIC CHOLECYSTECTOMY;  Surgeon: Leafy Ro, MD;  Location: [facility] ORS;  Service: General;  Laterality: N/A;  . KNEE SURGERY    . TUBAL LIGATION    . Tubal reanastamosis     Family History  Problem Relation Age of Onset  . Heart disease Mother   . Hyperlipidemia Mother   . Hypertension Mother   . Diabetes Father   . Heart disease Father   . Hyperlipidemia Father   . Hypertension Father   . Stroke Father   . Cancer Maternal Grandfather        lung  . Cancer Paternal Grandmother        stomach  . Epilepsy Daughter   . Asthma Son   . Stroke Paternal Grandfather    Social History  Substance Use Topics  . Smoking status: Current Every Day Smoker    Packs/day: 0.25    Years: 15.00    Types: Cigarettes    Last attempt to quit: 10/18/2015  . Smokeless tobacco: Never Used  . Alcohol use No    Current Outpatient Prescriptions:  .  acetaminophen (TYLENOL) 325 MG tablet, Take 975 mg by mouth every 6 (six) hours as needed (migraines)., Disp: , Rfl:  .  diphenhydrAMINE (BENADRYL) 25 mg capsule, Take 25 mg by mouth at bedtime as needed for allergies., Disp: , Rfl:  .  furosemide (LASIX) 40 MG tablet, Take 40 mg by mouth daily., Disp: , Rfl:  .  ibuprofen (ADVIL,MOTRIN) 600 MG  tablet, Take 1 tablet (600 mg total) by mouth every 6 (six) hours. (Patient not taking: Reported on 06/23/2016), Disp: 30 tablet, Rfl: 0 .  loratadine (CLARITIN) 10 MG tablet, Take 10 mg by mouth daily., Disp: , Rfl:  .  naproxen sodium (ANAPROX) 220 MG tablet, Take 440 mg by mouth 2 (two) times daily as needed (migraines)., Disp: , Rfl:  .  Prenatal Multivit-Min-Fe-FA (PRENATAL VITAMINS) 0.8 MG tablet, Take 1 tablet by mouth daily. (Patient not taking: Reported on 06/23/2016), Disp: 30 tablet, Rfl: 0 .  ranitidine (ZANTAC) 150 MG tablet, Take 150 mg by mouth daily as needed for heartburn., Disp: , Rfl:  Allergies: Onion and Latex  Review of Systems  Constitutional: Negative for chills, fever and malaise/fatigue.  HENT: Negative for congestion, sinus pain and sore throat.   Eyes: Negative for blurred vision and pain.  Respiratory: Negative for cough and wheezing.   Cardiovascular: Negative for chest pain and leg swelling.  Gastrointestinal: Negative for abdominal pain, constipation, diarrhea, heartburn, nausea and vomiting.  Genitourinary: Negative for dysuria, frequency, hematuria and urgency.  Musculoskeletal: Negative for back pain, joint pain, myalgias and neck pain.  Skin: Negative for itching and rash.  Neurological: Negative for dizziness, tremors and weakness.  Endo/Heme/Allergies:  Does not bruise/bleed easily.  Psychiatric/Behavioral: Negative for depression. The patient is not nervous/anxious and does not have insomnia.     Objective: BP 120/80   Pulse 83   Ht  (1.6 m)   Wt 238 lb (108 kg)   LMP 09/23/2016   BMI 42.16 kg/m   Filed Weights   10/12/16 1332  Weight: 238 lb (108 kg)   Physical Exam  Constitutional: She is oriented to person, place, and time. She appears well-developed and well-nourished. No distress.  Genitourinary: Rectum normal, vagina normal and uterus normal. Pelvic exam was performed with patient supine. There is no rash or lesion on the right labia.  There is no rash or lesion on the left labia. Vagina exhibits no lesion. No bleeding in the vagina. Right adnexum does not display mass and does not display tenderness. Left adnexum does not display mass and does not display tenderness. Cervix does not exhibit motion tenderness, lesion, friability or polyp.   Uterus is mobile and midaxial. Uterus is not enlarged or exhibiting a mass.  HENT:  Head: Normocephalic and atraumatic. Head is without laceration.  Right Ear: Hearing normal.  Left Ear: Hearing normal.  Nose: No epistaxis.  No foreign bodies.  Mouth/Throat: Uvula is midline, oropharynx is clear and moist and mucous membranes are normal.  Eyes: Pupils are equal, round, and reactive to light.  Neck: Normal range of motion. Neck supple. No thyromegaly present.  Cardiovascular: Normal rate and regular rhythm.  Exam reveals no gallop and no friction rub.   No murmur heard. Pulmonary/Chest: Effort normal and breath sounds normal. No respiratory distress. She has no wheezes. Right breast exhibits no mass, no skin change and no tenderness. Left breast exhibits no mass, no skin change and no tenderness.  Abdominal: Soft. Bowel sounds are normal. She exhibits no distension. There is no tenderness. There is no rebound.  Musculoskeletal: Normal range of motion.  Neurological: She is alert and oriented to person, place, and time. No cranial nerve deficit.  Skin: Skin is warm and dry.  Psychiatric: She has a normal mood and affect. Judgment normal.  Vitals reviewed.   Assessment: 1. Encounter for sterilization   Desires BTL over all other options.  The patient has been fully informed about all methods of contraception, both temporary and permanent. She understands that tubal ligation is meant to be permanent, absolute and irreversible. She was told that there is an approximately 1 in 400 chance of a pregnancy in the future after tubal ligation. She was told the short and long term complications of  tubal ligation. She understands the risks from this surgery include, but are not limited to, the risks of anesthesia, hemorrhage, infection, perforation, and injury to adjacent structures, bowel, bladder and blood vessels.   I have had a careful discussion with this patient about all the options available and the risk/benefits of each. I have fully informed this patient that surgery may subject her to a variety of discomforts and risks: She understands that most patients have surgery with little difficulty, but problems can happen ranging from minor to fatal. These include nausea, vomiting, pain, bleeding, infection, poor healing, hernia, or formation of adhesions. Unexpected reactions may occur from any drug or anesthetic given. Unintended injury may occur to other pelvic or abdominal structures such as Fallopian tubes, ovaries, bladder, ureter (tube from kidney to bladder), or bowel. Nerves going from the pelvis to the legs may be injured. Any such injury may require immediate or later additional surgery  to correct the problem. Excessive blood loss requiring transfusion is very unlikely but possible. Dangerous blood clots may form in the legs or lungs. Physical and sexual activity will be restricted in varying degrees for an indeterminate period of time but most often 2-6 weeks.  Finally, she understands that it is impossible to list every possible undesirable effect and that the condition for which surgery is done is not always cured or significantly improved, and in rare cases may be even worse.Ample time was given to answer all questions.  Annamarie Major, MD, [facility], [facility] 10/12/2016  1:43 PM

## 2016-10-13 ENCOUNTER — Encounter
Admission: RE | Admit: 2016-10-13 | Discharge: 2016-10-13 | Disposition: A | Discharge status: Home / Self Care | Payer: Medicaid Other | Source: Ambulatory Visit | Attending: Obstetrics & Gynecology | Admitting: Obstetrics & Gynecology

## 2016-10-13 HISTORY — DX: Localized edema: R60.0

## 2016-10-13 HISTORY — DX: Essential (primary) hypertension: I10

## 2016-10-13 HISTORY — DX: Gastro-esophageal reflux disease without esophagitis: K21.9

## 2016-10-13 NOTE — Patient Instructions (Addendum)
Your procedure is scheduled on: 10/19/16 Thurs Report to Same Day Surgery 2nd floor medical mall [facility] Dt Entrance-take elevator on left to 2nd floor.  Check in with surgery information desk.) To find out your arrival time please call [phone] between 1PM - 3PM on 10/18/16 Wed  Remember: Instructions that are not followed completely may result in serious medical risk, up to and including death, or upon the discretion of your surgeon and anesthesiologist your surgery may need to be rescheduled.    _x___ 1. Do not eat food after midnight the night before your procedure. You may drink clear liquids up to 2 hours before you are scheduled to arrive at the hospital for your procedure.  Do not drink clear liquids within 2 hours of your scheduled arrival to the hospital.  Clear liquids include  --Water or Apple juice without pulp  --Clear carbohydrate beverage such as ClearFast or Gatorade  --Black Coffee or Clear Tea (No milk, no creamers, do not add anything to                  the coffee or Tea Type 1 and type 2 diabetics should only drink water.  No gum chewing or hard candies.     __x__ 2. No Alcohol for 24 hours before or after surgery.   __x__3. No Smoking for 24 prior to surgery.   ____  4. Bring all medications with you on the day of surgery if instructed.    __x__ 5. Notify your doctor if there is any change in your medical condition     (cold, fever, infections).     Do not wear jewelry, make-up, hairpins, clips or nail polish.  Do not wear lotions, powders, or perfumes. You may wear deodorant.  Do not shave 48 hours prior to surgery. Men may shave face and neck.  Do not bring valuables to the hospital.    [facility] is not responsible for any belongings or valuables.               Contacts, dentures or bridgework may not be worn into surgery.  Leave your suitcase in the car. After surgery it may be brought to your room.  For patients admitted to the hospital,  discharge time is determined by your                       treatment team.   Patients discharged the day of surgery will not be allowed to drive home.  You will need someone to drive you home and stay with you the night of your procedure.    Please read over the following fact sheets that you were given:   [facility] Preparing for Surgery and or MRSA Information   _x___ Take anti-hypertensive listed below, cardiac, seizure, asthma,     anti-reflux and psychiatric medicines. These include:  1. Zantac the night before and the morning of surgery  2.  3.  4.  5.  6.  ____Fleets enema or Magnesium Citrate as directed.   _x___ Use CHG Soap or sage wipes as directed on instruction sheet   ____ Use inhalers on the day of surgery and bring to hospital day of surgery  ____ Stop Metformin and Janumet 2 days prior to surgery.    ____ Take 1/2 of usual insulin dose the night before surgery and none on the morning     surgery.   _x___ Follow recommendations from Cardiologist, Pulmonologist or PCP regarding  stopping Aspirin, Coumadin, Plavix ,Eliquis, Effient, or Pradaxa, and Pletal.  X____Stop Anti-inflammatories such as Advil, Aleve, Ibuprofen, Motrin, Naproxen, Naprosyn, Goodies powders or aspirin products. OK to take Tylenol and                          Celebrex.   _x___ Stop supplements until after surgery.  But may continue Vitamin D, Vitamin B,       and multivitamin.   ____ Bring C-Pap to the hospital.

## 2016-10-13 NOTE — Addendum Note (Signed)
Addended by: Nadara Mustard on: 10/13/2016 05:11 PM   Modules accepted: Orders, SmartSet

## 2016-10-13 NOTE — Pre-Procedure Instructions (Signed)
Called no answer message left on answering machine.

## 2016-10-16 ENCOUNTER — Encounter
Admission: RE | Admit: 2016-10-16 | Discharge: 2016-10-16 | Disposition: A | Discharge status: Home / Self Care | Payer: Medicaid Other | Source: Ambulatory Visit | Attending: Obstetrics & Gynecology | Admitting: Obstetrics & Gynecology

## 2016-10-16 DIAGNOSIS — I10 Essential (primary) hypertension: Secondary | ICD-10-CM | POA: Insufficient documentation

## 2016-10-16 DIAGNOSIS — I517 Cardiomegaly: Secondary | ICD-10-CM | POA: Insufficient documentation

## 2016-10-16 DIAGNOSIS — Z01812 Encounter for preprocedural laboratory examination: Secondary | ICD-10-CM | POA: Diagnosis not present

## 2016-10-16 DIAGNOSIS — Z01810 Encounter for preprocedural cardiovascular examination: Secondary | ICD-10-CM | POA: Insufficient documentation

## 2016-10-16 LAB — CBC
HCT: 35.3 % (ref 35.0–47.0)
Hemoglobin: 11.8 g/dL — ABNORMAL LOW (ref 12.0–16.0)
MCH: 24.5 pg — ABNORMAL LOW (ref 26.0–34.0)
MCHC: 33.4 g/dL (ref 32.0–36.0)
MCV: 73.6 fL — ABNORMAL LOW (ref 80.0–100.0)
PLATELETS: 183 10*3/uL (ref 150–440)
RBC: 4.79 MIL/uL (ref 3.80–5.20)
RDW: 18.9 % — ABNORMAL HIGH (ref 11.5–14.5)
WBC: 6.4 10*3/uL (ref 3.6–11.0)

## 2016-10-16 LAB — TYPE AND SCREEN
ABO/RH(D): A POS
ANTIBODY SCREEN: NEGATIVE

## 2016-10-16 LAB — POTASSIUM: POTASSIUM: 3.5 mmol/L (ref 3.5–5.1)

## 2016-10-19 ENCOUNTER — Ambulatory Visit: Payer: Medicaid Other | Admitting: Anesthesiology

## 2016-10-19 ENCOUNTER — Ambulatory Visit
Admission: RE | Admit: 2016-10-19 | Discharge: 2016-10-19 | Disposition: A | Discharge status: Home / Self Care | Payer: Medicaid Other | Source: Ambulatory Visit | Attending: Obstetrics & Gynecology | Admitting: Obstetrics & Gynecology

## 2016-10-19 ENCOUNTER — Encounter
Admission: RE | Disposition: A | Discharge status: Home / Self Care | Payer: Self-pay | Source: Ambulatory Visit | Attending: Obstetrics & Gynecology

## 2016-10-19 DIAGNOSIS — N838 Other noninflammatory disorders of ovary, fallopian tube and broad ligament: Secondary | ICD-10-CM | POA: Diagnosis not present

## 2016-10-19 DIAGNOSIS — K219 Gastro-esophageal reflux disease without esophagitis: Secondary | ICD-10-CM | POA: Insufficient documentation

## 2016-10-19 DIAGNOSIS — Z6841 Body Mass Index (BMI) 40.0 and over, adult: Secondary | ICD-10-CM | POA: Insufficient documentation

## 2016-10-19 DIAGNOSIS — F419 Anxiety disorder, unspecified: Secondary | ICD-10-CM | POA: Diagnosis not present

## 2016-10-19 DIAGNOSIS — Z302 Encounter for sterilization: Secondary | ICD-10-CM

## 2016-10-19 DIAGNOSIS — Z79899 Other long term (current) drug therapy: Secondary | ICD-10-CM | POA: Diagnosis not present

## 2016-10-19 DIAGNOSIS — I10 Essential (primary) hypertension: Secondary | ICD-10-CM | POA: Insufficient documentation

## 2016-10-19 DIAGNOSIS — F17200 Nicotine dependence, unspecified, uncomplicated: Secondary | ICD-10-CM | POA: Insufficient documentation

## 2016-10-19 DIAGNOSIS — F329 Major depressive disorder, single episode, unspecified: Secondary | ICD-10-CM | POA: Insufficient documentation

## 2016-10-19 HISTORY — PX: LAPAROSCOPIC SALPINGO OOPHERECTOMY: SHX5927

## 2016-10-19 LAB — ABO/RH: ABO/RH(D): A POS

## 2016-10-19 LAB — POCT PREGNANCY, URINE: Preg Test, Ur: NEGATIVE

## 2016-10-19 SURGERY — SALPINGO-OOPHORECTOMY, LAPAROSCOPIC
Anesthesia: General

## 2016-10-19 MED ORDER — MIDAZOLAM HCL 2 MG/2ML IJ SOLN
INTRAMUSCULAR | Status: DC | PRN
  Administered 2016-10-19: 2 mg via INTRAVENOUS

## 2016-10-19 MED ORDER — FENTANYL CITRATE (PF) 100 MCG/2ML IJ SOLN: 25.0000 ug | INTRAMUSCULAR | Status: DC | PRN

## 2016-10-19 MED ORDER — ONDANSETRON HCL 4 MG/2ML IJ SOLN
INTRAMUSCULAR | Status: DC | PRN
  Administered 2016-10-19: 4 mg via INTRAVENOUS

## 2016-10-19 MED ORDER — DEXAMETHASONE SODIUM PHOSPHATE 10 MG/ML IJ SOLN
INTRAMUSCULAR | Status: DC | PRN
  Administered 2016-10-19: 10 mg via INTRAVENOUS

## 2016-10-19 MED ORDER — PROPOFOL 10 MG/ML IV BOLUS
INTRAVENOUS | Status: DC | PRN
  Administered 2016-10-19: 180 mg via INTRAVENOUS

## 2016-10-19 MED ORDER — BUPIVACAINE HCL (PF) 0.5 % IJ SOLN
INTRAMUSCULAR | Status: DC | PRN
  Administered 2016-10-19: 11 mL

## 2016-10-19 MED ORDER — ROCURONIUM BROMIDE 100 MG/10ML IV SOLN
INTRAVENOUS | Status: DC | PRN
  Administered 2016-10-19: 40 mg via INTRAVENOUS

## 2016-10-19 MED ORDER — PROPOFOL 10 MG/ML IV BOLUS
INTRAVENOUS | Status: AC
  Filled 2016-10-19: qty 20

## 2016-10-19 MED ORDER — ONDANSETRON HCL 4 MG/2ML IJ SOLN
4.0000 mg | Freq: Once | INTRAMUSCULAR | Status: AC | PRN
  Administered 2016-10-19: 4 mg via INTRAVENOUS

## 2016-10-19 MED ORDER — LIDOCAINE HCL (CARDIAC) 20 MG/ML IV SOLN
INTRAVENOUS | Status: DC | PRN
  Administered 2016-10-19: 100 mg via INTRAVENOUS

## 2016-10-19 MED ORDER — KETOROLAC TROMETHAMINE 30 MG/ML IJ SOLN
30.0000 mg | Freq: Four times a day (QID) | INTRAMUSCULAR | Status: DC
  Filled 2016-10-19: qty 1

## 2016-10-19 MED ORDER — FENTANYL CITRATE (PF) 100 MCG/2ML IJ SOLN
INTRAMUSCULAR | Status: DC | PRN
  Administered 2016-10-19: 100 ug via INTRAVENOUS
  Administered 2016-10-19 (×2): 50 ug via INTRAVENOUS

## 2016-10-19 MED ORDER — FENTANYL CITRATE (PF) 100 MCG/2ML IJ SOLN
INTRAMUSCULAR | Status: AC
  Filled 2016-10-19: qty 2

## 2016-10-19 MED ORDER — SILVER NITRATE-POT NITRATE 75-25 % EX MISC
CUTANEOUS | Status: AC
  Filled 2016-10-19: qty 1

## 2016-10-19 MED ORDER — MIDAZOLAM HCL 2 MG/2ML IJ SOLN
INTRAMUSCULAR | Status: AC
  Filled 2016-10-19: qty 2

## 2016-10-19 MED ORDER — BUPIVACAINE HCL (PF) 0.5 % IJ SOLN
INTRAMUSCULAR | Status: AC
  Filled 2016-10-19: qty 30

## 2016-10-19 MED ORDER — ACETAMINOPHEN 650 MG RE SUPP
650.0000 mg | RECTAL | Status: DC | PRN
  Filled 2016-10-19: qty 1

## 2016-10-19 MED ORDER — ACETAMINOPHEN 325 MG PO TABS: 650.0000 mg | ORAL_TABLET | ORAL | Status: DC | PRN

## 2016-10-19 MED ORDER — ONDANSETRON HCL 4 MG/2ML IJ SOLN
INTRAMUSCULAR | Status: AC
Start: 2016-10-19 — End: ?
  Filled 2016-10-19: qty 2

## 2016-10-19 MED ORDER — DEXAMETHASONE SODIUM PHOSPHATE 10 MG/ML IJ SOLN
INTRAMUSCULAR | Status: AC
  Filled 2016-10-19: qty 1

## 2016-10-19 MED ORDER — SUGAMMADEX SODIUM 200 MG/2ML IV SOLN
INTRAVENOUS | Status: AC
Start: 2016-10-19 — End: ?
  Filled 2016-10-19: qty 2

## 2016-10-19 MED ORDER — OXYCODONE-ACETAMINOPHEN 5-325 MG PO TABS: 1.0000 | ORAL_TABLET | ORAL | 0 refills | Status: DC | PRN

## 2016-10-19 MED ORDER — LACTATED RINGERS IV SOLN
INTRAVENOUS | Status: DC
  Administered 2016-10-19: 11:00:00 via INTRAVENOUS

## 2016-10-19 MED ORDER — ONDANSETRON HCL 4 MG/2ML IJ SOLN
INTRAMUSCULAR | Status: AC
  Administered 2016-10-19: 4 mg via INTRAVENOUS
  Filled 2016-10-19: qty 2

## 2016-10-19 MED ORDER — LIDOCAINE HCL (PF) 2 % IJ SOLN
INTRAMUSCULAR | Status: AC
  Filled 2016-10-19: qty 10

## 2016-10-19 MED ORDER — MORPHINE SULFATE (PF) 4 MG/ML IV SOLN: 1.0000 mg | INTRAVENOUS | Status: DC | PRN

## 2016-10-19 MED ORDER — SUGAMMADEX SODIUM 200 MG/2ML IV SOLN
INTRAVENOUS | Status: DC | PRN
Start: 2016-10-19 — End: 2016-10-19
  Administered 2016-10-19: 200 mg via INTRAVENOUS

## 2016-10-19 MED ORDER — LACTATED RINGERS IV SOLN: INTRAVENOUS | Status: DC

## 2016-10-19 SURGICAL SUPPLY — 39 items
BLADE SURG SZ11 CARB STEEL (BLADE) ×2 IMPLANT
CANISTER SUCT 1200ML W/VALVE (MISCELLANEOUS) ×2 IMPLANT
CATH ROBINSON RED A/P 16FR (CATHETERS) ×2 IMPLANT
CHLORAPREP W/TINT 26ML (MISCELLANEOUS) ×2 IMPLANT
DERMABOND ADVANCED (GAUZE/BANDAGES/DRESSINGS) ×1
DERMABOND ADVANCED .7 DNX12 (GAUZE/BANDAGES/DRESSINGS) ×1 IMPLANT
DRSG TELFA 4X3 1S NADH ST (GAUZE/BANDAGES/DRESSINGS) IMPLANT
GAUZE SPONGE NON-WVN 2X2 STRL (MISCELLANEOUS) IMPLANT
GLOVE BIO SURGEON STRL SZ8 (GLOVE) ×2 IMPLANT
GLOVE INDICATOR 8.0 STRL GRN (GLOVE) ×2 IMPLANT
GOWN STRL REUS W/ TWL LRG LVL3 (GOWN DISPOSABLE) ×1 IMPLANT
GOWN STRL REUS W/ TWL XL LVL3 (GOWN DISPOSABLE) ×1 IMPLANT
GOWN STRL REUS W/TWL LRG LVL3 (GOWN DISPOSABLE) ×1
GOWN STRL REUS W/TWL XL LVL3 (GOWN DISPOSABLE) ×1
GRASPER SUT TROCAR 14GX15 (MISCELLANEOUS) ×2 IMPLANT
IRRIGATION STRYKERFLOW (MISCELLANEOUS) IMPLANT
IRRIGATOR STRYKERFLOW (MISCELLANEOUS)
IV LACTATED RINGERS 1000ML (IV SOLUTION) IMPLANT
KIT PINK PAD W/HEAD ARE REST (MISCELLANEOUS) ×2
KIT PINK PAD W/HEAD ARM REST (MISCELLANEOUS) ×1 IMPLANT
LABEL OR SOLS (LABEL) ×2 IMPLANT
NEEDLE VERESS 14GA 120MM (NEEDLE) ×2 IMPLANT
NS IRRIG 500ML POUR BTL (IV SOLUTION) ×2 IMPLANT
PACK GYN LAPAROSCOPIC (MISCELLANEOUS) ×2 IMPLANT
PAD PREP 24X41 OB/GYN DISP (PERSONAL CARE ITEMS) ×2 IMPLANT
POUCH SPECIMEN RETRIEVAL 10MM (ENDOMECHANICALS) IMPLANT
SCISSORS METZENBAUM CVD 33 (INSTRUMENTS) ×2 IMPLANT
SHEARS HARMONIC ACE PLUS 36CM (ENDOMECHANICALS) IMPLANT
SLEEVE ENDOPATH XCEL 5M (ENDOMECHANICALS) IMPLANT
SPONGE VERSALON 2X2 STRL (MISCELLANEOUS)
STRAP SAFETY BODY (MISCELLANEOUS) ×2 IMPLANT
SUT VIC AB 0 CT1 36 (SUTURE) ×2 IMPLANT
SUT VIC AB 2-0 UR6 27 (SUTURE) IMPLANT
SUT VIC AB 4-0 PS2 18 (SUTURE) IMPLANT
SUT VICRYL 0 AB UR-6 (SUTURE) ×2 IMPLANT
SYRINGE 10CC LL (SYRINGE) ×2 IMPLANT
TROCAR ENDO BLADELESS 11MM (ENDOMECHANICALS) IMPLANT
TROCAR XCEL NON-BLD 5MMX100MML (ENDOMECHANICALS) ×2 IMPLANT
TUBING INSUFFLATOR HI FLOW (MISCELLANEOUS) ×2 IMPLANT

## 2016-10-19 NOTE — Anesthesia Procedure Notes (Signed)
Procedure Name: Intubation Performed by: Shrihan Putt Pre-anesthesia Checklist: Patient identified, Patient being monitored, Timeout performed, Emergency Drugs available and Suction available Patient Re-evaluated:Patient Re-evaluated prior to induction Oxygen Delivery Method: Circle system utilized Preoxygenation: Pre-oxygenation with 100% oxygen Induction Type: IV induction Ventilation: Mask ventilation without difficulty Laryngoscope Size: Mac and 3 Grade View: Grade I Tube type: Oral Tube size: 7.0 mm Number of attempts: 1 Airway Equipment and Method: Stylet Placement Confirmation: ETT inserted through vocal cords under direct vision,  positive ETCO2 and breath sounds checked- equal and bilateral Secured at: 21 cm Tube secured with: Tape Dental Injury: Teeth and Oropharynx as per pre-operative assessment

## 2016-10-19 NOTE — Anesthesia Post-op Follow-up Note (Signed)
Anesthesia QCDR form completed.        

## 2016-10-19 NOTE — Op Note (Signed)
  Operative Note   10/19/2016  PRE-OP DIAGNOSIS: Desire for permanent sterilization  POST-OP DIAGNOSIS: same   PROCEDURE: Procedure(s): LAPAROSCOPIC PARTIAL SALPINGECTOMY bilateral   SURGEON: Annamarie Major, MD, FACOG  ANESTHESIA: Choice   ESTIMATED BLOOD LOSS: Min  COMPLICATIONS: None  DISPOSITION: PACU - hemodynamically stable.  CONDITION: stable  FINDINGS: Laparoscopic survey of the abdomen revealed a grossly normal uterus, tubes, ovaries, liver edge, gallbladder edge and appendix, No intra-abdominal adhesions were noted.  Hulka clamps are seen on the left tube (2) and also on the right adjacent to tube (2), and are also removed.  PROCEDURE IN DETAIL: The patient was taken to the OR where anesthesia was administed. The patient was positioned in dorsal lithotomy in the [place] stirrups. The patient was then examined under anesthesia with the above noted findings. The patient was prepped and draped in the normal sterile fashion and bladder was drained using a red rubber cathater. Speculum exam normal, and a hulka tenaculum was placed for manipulation purposes.  Attention was turned to the patient's abdomen where a 5 mm skin incision was made in the umbilical fold, after injection of local anesthesia. The Veress step needle was carefully introduced into the peritoneal cavity with placement confirmed using the hanging drop technique.  Pneumoperitoneum was obtained. The 5 mm port was then placed under direct visualization with the operative laparoscope  The above noted findings.  Trendelenburg.  A11 mm trocar was then placed in the right lower quadrant under direct visualization with the laparoscope and a 5 mm torcar was placed in the suprapubic region.  Right and left fallopian tubes are identified and followed out to their fimbria.  A significant portion of each tube along with Hulka clips found are excised utilizing the Harmonic scapel to include the fibria.  No injuries or bleeding was  noted.  All instruments and ports were then removed from the abdomen after gas was expelled and patient was leveled.   The skin was closed with skin adhesive. The patient tolerated the procedure well. All counts were correct x 2. The patient was transferred to the recovery room awake, alert and breathing independently.  Annamarie Major, MD, [facility], [facility] 10/19/2016  12:33 PM

## 2016-10-19 NOTE — H&P (Signed)
History and Physical Interval Note:  10/19/2016 10:53 AM  Holly Nicholson  has presented today for surgery, with the diagnosis of STERILIZATION  The various methods of treatment have been discussed with the patient and family. After consideration of risks, benefits and other options for treatment, the patient has consented to  Procedure(s): LAPAROSCOPIC PARTIAL SALPINGECTOMY  as a surgical intervention .  The patient's history has been reviewed, patient examined, no change in status, stable for surgery.  Pt has the following beta blocker history-  Not taking Beta Blocker.  I have reviewed the patient's chart and labs.  Questions were answered to the patient's satisfaction.    Letitia Libra

## 2016-10-19 NOTE — Anesthesia Preprocedure Evaluation (Signed)
Anesthesia Evaluation  Patient identified by MRN, date of birth, ID band Patient awake    Reviewed: Allergy & Precautions, H&P , NPO status , Patient's Chart, lab work & pertinent test results, reviewed documented beta blocker date and time   Airway Mallampati: II  TM Distance: >3 FB Neck ROM: full    Dental  (+) Teeth Intact   Pulmonary neg pulmonary ROS, Current Smoker,    Pulmonary exam normal        Cardiovascular Exercise Tolerance: Good hypertension, On Medications negative cardio ROS Normal cardiovascular exam Rhythm:regular Rate:Normal     Neuro/Psych PSYCHIATRIC DISORDERS negative neurological ROS  negative psych ROS   GI/Hepatic negative GI ROS, Neg liver ROS, GERD  ,  Endo/Other  negative endocrine ROSMorbid obesity  Renal/GU negative Renal ROS  negative genitourinary   Musculoskeletal   Abdominal   Peds  Hematology negative hematology ROS (+)   Anesthesia Other Findings Past Medical History: No date: Anxiety No date: BMI 45.0-49.9, adult (HCC) No date: Depression No date: Elevated blood pressure No date: GERD (gastroesophageal reflux disease) 2007: History of bilateral tubal ligation 2007: History of bilateral tubal ligation No date: History of reversal of tubal ligation No date: Hypertension No date: Low-lying placenta No date: Lower extremity edema Aug 2016: Tubal pregnancy Past Surgical History: 09/02/2015: CHOLECYSTECTOMY; N/A     Comment:  Procedure: LAPAROSCOPIC CHOLECYSTECTOMY;  Surgeon: Leafy Ro, MD;  Location: [facility] ORS;  Service: General;                Laterality: N/A; No date: KNEE SURGERY No date: TUBAL LIGATION No date: Tubal reanastamosis   Reproductive/Obstetrics negative OB ROS                             Anesthesia Physical Anesthesia Plan  ASA: III  Anesthesia Plan: General ETT   Post-op Pain Management:    Induction:    PONV Risk Score and Plan: 4 or greater and Ondansetron, Dexamethasone, Midazolam and Propofol infusion  Airway Management Planned:   Additional Equipment:   Intra-op Plan:   Post-operative Plan:   Informed Consent: I have reviewed the patients History and Physical, chart, labs and discussed the procedure including the risks, benefits and alternatives for the proposed anesthesia with the patient or authorized representative who has indicated his/her understanding and acceptance.   Dental Advisory Given  Plan Discussed with: CRNA  Anesthesia Plan Comments:         Anesthesia Quick Evaluation

## 2016-10-19 NOTE — Transfer of Care (Signed)
Immediate Anesthesia Transfer of Care Note  Patient: Holly Nicholson  Procedure(s) Performed: LAPAROSCOPIC SALPINGECTOMY bilateral (N/A )  Patient Location: PACU  Anesthesia Type:General  Level of Consciousness: awake  Airway & Oxygen Therapy: Patient Spontanous Breathing and Patient connected to face mask oxygen  Post-op Assessment: Report given to RN and Post -op Vital signs reviewed and stable  Post vital signs: Reviewed and stable  Last Vitals:  Vitals:   10/19/16 1010  BP: (!) 129/104  Pulse: 72  Resp: 18  Temp: 36.4 C  SpO2: 95%    Last Pain:  Vitals:   10/19/16 1010  TempSrc: Oral         Complications: No apparent anesthesia complications

## 2016-10-19 NOTE — Anesthesia Postprocedure Evaluation (Signed)
Anesthesia Post Note  Patient: Girl Schissler  Procedure(s) Performed: LAPAROSCOPIC SALPINGECTOMY bilateral (N/A )  Patient location during evaluation: PACU Anesthesia Type: General Level of consciousness: awake and alert Pain management: pain level controlled Vital Signs Assessment: post-procedure vital signs reviewed and stable Respiratory status: spontaneous breathing, nonlabored ventilation, respiratory function stable and patient connected to nasal cannula oxygen Cardiovascular status: blood pressure returned to baseline and stable Postop Assessment: no apparent nausea or vomiting Anesthetic complications: no     Last Vitals:  Vitals:   10/19/16 1337 10/19/16 1416  BP: 111/61 (!) 114/56  Pulse: 78 90  Resp: 18   Temp: (!) 35.8 C   SpO2: 95% 100%    Last Pain:  Vitals:   10/19/16 1337  TempSrc: Temporal  PainSc:                  Yevette Edwards

## 2016-10-19 NOTE — Discharge Instructions (Signed)
General Gynecological Post-Operative Instructions °You may expect to feel dizzy, weak, and drowsy for as long as 24 hours after receiving the medicine that made you sleep (anesthetic).  °Do not drive a car, ride a bicycle, participate in physical activities, or take public transportation until you are done taking narcotic pain medicines or as directed by your doctor.  °Do not drink alcohol or take tranquilizers.  °Do not take medicine that has not been prescribed by your doctor.  °Do not sign important papers or make important decisions while on narcotic pain medicines.  °Have a responsible person with you.  °CARE OF INCISION  °Keep incision clean and dry. °Take showers instead of baths until your doctor gives you permission to take baths.  °Avoid heavy lifting (more than 10 pounds/4.5 kilograms), pushing, or pulling.  °Avoid activities that may risk injury to your surgical site.  °No sexual intercourse or placement of anything in the vagina for 1 weeks or as instructed by your doctor. °If you have tubes coming from the wound site, check with your doctor regarding appropriate care of the tubes. °Only take prescription or over-the-counter medicines  for pain, discomfort, or fever as directed by your doctor. Do not take aspirin. It can make you bleed. Take medicines (antibiotics) that kill germs if they are prescribed for you.  °Call the office or go to the ER if:  °You feel sick to your stomach (nauseous) and you start to throw up (vomit).  °You have trouble eating or drinking.  °You have an oral temperature above 101.  °You have constipation that is not helped by adjusting diet or increasing fluid intake. Pain medicines are a common cause of constipation.  °You have any other concerns. °SEEK IMMEDIATE MEDICAL CARE IF:  °You have persistent dizziness.  °You have difficulty breathing or a congested sounding (croupy) cough.  °You have an oral temperature above 102.5, not controlled by medicine.  °There is increasing  pain or tenderness near or in the surgical site.  ° ° ° °

## 2016-10-21 LAB — SURGICAL PATHOLOGY

## 2016-11-01 ENCOUNTER — Ambulatory Visit (INDEPENDENT_AMBULATORY_CARE_PROVIDER_SITE_OTHER): Payer: Medicaid Other | Admitting: Obstetrics & Gynecology

## 2016-11-01 ENCOUNTER — Encounter: Payer: Self-pay | Admitting: Obstetrics & Gynecology

## 2016-11-01 VITALS — BP 120/80 | HR 80 | Ht 63.0 in | Wt 235.0 lb

## 2016-11-01 DIAGNOSIS — N979 Female infertility, unspecified: Secondary | ICD-10-CM | POA: Insufficient documentation

## 2016-11-01 NOTE — Progress Notes (Signed)
  Postoperative Follow-up Patient presents post op from laparoscopy and salpingectomy for requested sterilization, 2 weeks ago.  Subjective: Patient reports marked improvement in her preop symptoms. Eating a regular diet without difficulty. The patient is not having any pain.  Activity: normal activities of daily living. Patient reports vaginal sx's of None  Objective: BP 120/80   Pulse 80   Ht 5\' 3"  (1.6 m)   Wt 235 lb (106.6 kg)   BMI 41.63 kg/m  Physical Exam  Constitutional: She is oriented to person, place, and time. She appears well-developed and well-nourished. No distress.  Cardiovascular: Normal rate.   Pulmonary/Chest: Effort normal.  Abdominal: Soft. She exhibits no distension. There is no tenderness.  Incision Healing Well   Musculoskeletal: Normal range of motion.  Neurological: She is alert and oriented to person, place, and time. No cranial nerve deficit.  Skin: Skin is warm and dry.  Psychiatric: She has a normal mood and affect.   Assessment: s/p :  tubal ligation stable  Plan: Patient has done well after surgery with no apparent complications.  I have discussed the post-operative course to date, and the expected progress moving forward.  The patient understands what complications to be concerned about.  I will see the patient in routine follow up, or sooner if needed.    Activity plan: No restriction.  Letitia Libraobert Paul Rexine Gowens 11/01/2016, 2:19 PM

## 2016-11-23 ENCOUNTER — Ambulatory Visit (INDEPENDENT_AMBULATORY_CARE_PROVIDER_SITE_OTHER): Payer: BLUE CROSS/BLUE SHIELD | Admitting: Family Medicine

## 2016-11-23 ENCOUNTER — Ambulatory Visit: Payer: Self-pay | Admitting: Family Medicine

## 2016-11-23 ENCOUNTER — Encounter: Payer: Self-pay | Admitting: Family Medicine

## 2016-11-23 VITALS — BP 114/69 | HR 76 | Temp 98.3°F | Ht 63.0 in | Wt 233.2 lb

## 2016-11-23 DIAGNOSIS — M25472 Effusion, left ankle: Secondary | ICD-10-CM

## 2016-11-23 DIAGNOSIS — F3132 Bipolar disorder, current episode depressed, moderate: Secondary | ICD-10-CM

## 2016-11-23 DIAGNOSIS — M25471 Effusion, right ankle: Secondary | ICD-10-CM | POA: Diagnosis not present

## 2016-11-23 MED ORDER — LURASIDONE HCL 20 MG PO TABS: 20.0000 mg | ORAL_TABLET | Freq: Every day | ORAL | 1 refills | Status: DC

## 2016-11-23 MED ORDER — CHLORTHALIDONE 25 MG PO TABS: 25.0000 mg | ORAL_TABLET | Freq: Every day | ORAL | 0 refills | Status: DC

## 2016-11-23 NOTE — Progress Notes (Signed)
BP 114/69 (BP Location: Right Arm, Patient Position: Sitting, Cuff Size: Large)   Pulse 76   Temp 98.3 F (36.8 C) (Oral)   Ht 5\' 3"  (1.6 m)   Wt 233 lb 3.2 oz (105.8 kg)   SpO2 100%   BMI 41.31 kg/m    Subjective:    Patient ID: Holly Nicholson, female    DOB: 05/13/1985, 31 y.o.   MRN: [id number]  HPI: Holly Nicholson is a 31 y.o. female  Chief Complaint  Patient presents with  . Depression    Used to be on abilify and zoloft. Lost both parents in the past years. Worsening after childbirth 5 months ago. Patient states she doesn't want to function and that some days she doesn't want to be here.    Patient presents today wanting to discuss severe depression. Was previously on zoloft and abilify, did not do well on those. Lost both parents recently (within the past year). Experiencing crying spells, severe lack of interest in her daily life, passive suicidality that pt states she would never act on, just feels hopeless.  Has been seen at [facility] and [facility] previously but did not have good experiences.   Also concerned about her continual LE edema despite being on 40 mg lasix daily. Does not eat a high salt diet, elevates feet at night. Does not wear compression stockings. Denies CP, SOB.   Past Medical History:  Diagnosis Date  . Anxiety   . BMI 45.0-49.9, adult (HCC)   . Depression   . Elevated blood pressure   . GERD (gastroesophageal reflux disease)   . History of bilateral tubal ligation 2007  . History of bilateral tubal ligation 2007  . History of reversal of tubal ligation   . Hypertension   . Low-lying placenta   . Lower extremity edema   . Tubal pregnancy Aug 2016   Social History   Socioeconomic History  . Marital status: Married    Spouse name: Not on file  . Number of children: 4  . Years of education: Not on file  . Highest education level: Not on file  Social Needs  . Financial resource strain: Not on file  . Food insecurity - worry: Not on file  .  Food insecurity - inability: Not on file  . Transportation needs - medical: Not on file  . Transportation needs - non-medical: Not on file  Occupational History  . Not on file  Tobacco Use  . Smoking status: Current Every Day Smoker    Packs/day: 0.50    Years: 15.00    Pack years: 7.50    Types: Cigarettes    Last attempt to quit: 10/18/2015    Years since quitting: 1.1  . Smokeless tobacco: Never Used  Substance and Sexual Activity  . Alcohol use: Yes    Comment: occ.  . Drug use: No  . Sexual activity: Yes  Other Topics Concern  . Not on file  Social History Narrative  . Not on file   Relevant past medical, surgical, family and social history reviewed and updated as indicated. Interim medical history since our last visit reviewed. Allergies and medications reviewed and updated.  Review of Systems  Constitutional: Negative.   HENT: Negative.   Eyes: Negative.   Respiratory: Negative.   Cardiovascular: Positive for leg swelling.  Gastrointestinal: Negative.   Genitourinary: Negative.   Musculoskeletal: Negative.   Neurological: Negative.   Psychiatric/Behavioral: Positive for decreased concentration, dysphoric mood, sleep disturbance and suicidal ideas (  passive). Negative for hallucinations and self-injury.    Per HPI unless specifically indicated above     Objective:    BP 114/69 (BP Location: Right Arm, Patient Position: Sitting, Cuff Size: Large)   Pulse 76   Temp 98.3 F (36.8 C) (Oral)   Ht 5\' 3"  (1.6 m)   Wt 233 lb 3.2 oz (105.8 kg)   SpO2 100%   BMI 41.31 kg/m   Wt Readings from Last 3 Encounters:  11/23/16 233 lb 3.2 oz (105.8 kg)  11/01/16 235 lb (106.6 kg)  10/13/16 230 lb (104.3 kg)    Physical Exam  Constitutional: She is oriented to person, place, and time. She appears well-developed and well-nourished. No distress.  HENT:  Head: Atraumatic.  Eyes: Conjunctivae are normal. Pupils are equal, round, and reactive to light. No scleral icterus.    Neck: Normal range of motion. Neck supple.  Cardiovascular: Normal rate and normal heart sounds.  Pulmonary/Chest: Effort normal and breath sounds normal. She exhibits no tenderness.  Musculoskeletal: Normal range of motion. She exhibits edema (b/l LEs symmetrically edematous).  Neurological: She is alert and oriented to person, place, and time.  Skin: Skin is warm and dry.  Psychiatric: Judgment and thought content normal.  Flat affect  Nursing note and vitals reviewed.     Assessment & Plan:   Problem List Items Addressed This Visit      Other   Bipolar affective disorder, currently depressed, moderate (HCC) - Primary    Will try latuda and refer to Psychiatry for further management and counseling recommendations. Risks and benefits reviewed, pt aware to come back in 1 month for recheck if not yet established with Psychiatry. Long discussion about suicidality - pt aware to go to ER or call Crisis Center if thoughts ever re-appear.       Relevant Orders   Ambulatory referral to Psychiatry   Swelling of both ankles    Pt was hoping to increase lasix dose, discussed that due to her already soft BPs this would not be a good idea. Pt willing to try a similar medication in hopes it works better for her. Will start chlorthalidone and monitor closely for benefit. Recommended compression stockings in addition to salt restriction, good hydration, regular exercise, leg elevation. Will recheck in 1 month to reassess BMP and BPs.           Follow up plan: Return in about 4 weeks (around 12/21/2016) for BP, bmp, depression f/u.

## 2016-11-26 NOTE — Assessment & Plan Note (Signed)
Will try latuda and refer to Psychiatry for further management and counseling recommendations. Risks and benefits reviewed, pt aware to come back in 1 month for recheck if not yet established with Psychiatry. Long discussion about suicidality - pt aware to go to ER or call Crisis Center if thoughts ever re-appear.

## 2016-11-26 NOTE — Patient Instructions (Signed)
Follow up in 1 month   

## 2016-11-26 NOTE — Assessment & Plan Note (Signed)
Pt was hoping to increase lasix dose, discussed that due to her already soft BPs this would not be a good idea. Pt willing to try a similar medication in hopes it works better for her. Will start chlorthalidone and monitor closely for benefit. Recommended compression stockings in addition to salt restriction, good hydration, regular exercise, leg elevation. Will recheck in 1 month to reassess BMP and BPs.

## 2016-12-15 ENCOUNTER — Telehealth: Payer: Self-pay

## 2016-12-15 NOTE — Telephone Encounter (Signed)
Copied from CRM [id number]. Topic: General - Other >> Dec 14, 2016 11:50 AM Viviann SpareWhite, Selina wrote: Reason for CRM: Gracelyn NurseBrea from [facility] called and said they are unable to reach the patient and will hold on to the referral for 6 months. Patient can call and schedule an appointment when she is ready.

## 2016-12-15 NOTE — Telephone Encounter (Signed)
Routing to provider. FYI.  

## 2016-12-22 ENCOUNTER — Encounter: Payer: Self-pay | Admitting: Family Medicine

## 2016-12-22 ENCOUNTER — Ambulatory Visit (INDEPENDENT_AMBULATORY_CARE_PROVIDER_SITE_OTHER): Payer: BLUE CROSS/BLUE SHIELD | Admitting: Family Medicine

## 2016-12-22 VITALS — BP 118/75 | HR 91 | Temp 98.0°F | Wt 233.1 lb

## 2016-12-22 DIAGNOSIS — F411 Generalized anxiety disorder: Secondary | ICD-10-CM | POA: Diagnosis not present

## 2016-12-22 DIAGNOSIS — F3132 Bipolar disorder, current episode depressed, moderate: Secondary | ICD-10-CM

## 2016-12-22 MED ORDER — ALPRAZOLAM 0.25 MG PO TABS: 0.2500 mg | ORAL_TABLET | Freq: Two times a day (BID) | ORAL | 0 refills | Status: DC | PRN

## 2016-12-22 MED ORDER — LURASIDONE HCL 20 MG PO TABS: 20.0000 mg | ORAL_TABLET | Freq: Every day | ORAL | 1 refills | Status: DC

## 2016-12-22 MED ORDER — BUSPIRONE HCL 10 MG PO TABS: 10.0000 mg | ORAL_TABLET | Freq: Two times a day (BID) | ORAL | 3 refills | Status: DC

## 2016-12-22 NOTE — Progress Notes (Signed)
BP 118/75 (BP Location: Left Arm, Patient Position: Sitting, Cuff Size: Normal)   Pulse 91   Temp 98 F (36.7 C) (Oral)   Wt 233 lb 1.6 oz (105.7 kg)   SpO2 91%   BMI 41.29 kg/m    Subjective:    Patient ID: Holly Nicholson, female    DOB: 02/01/1985, 31 y.o.   MRN: [id number]  HPI: Holly Nicholson is a 31 y.o. female  Chief Complaint  Patient presents with  . Depression    Patient state depression is slowly getting better.  Marland Kitchen. Anxiety    Anxiety is still the same.  . Follow-up   Patient presents today for bipolar depression and anxiety f/u after starting latuda. Feels her depression is significantly improved with no further suicidality and no manic episodes noted. Does have some nausea with the medication but states it is not severe. Anxiety is much worse, and is constant throughout the day. Unsure if it's the medicine or the season given her recent losses of both parents and it approaching the holidays. Has panic episodes almost daily. Very interested in counseling. Has had xanax rarely in the past which helped significantly when she gets into periods like this.   Past Medical History:  Diagnosis Date  . Anxiety   . BMI 45.0-49.9, adult (HCC)   . Depression   . Elevated blood pressure   . GERD (gastroesophageal reflux disease)   . History of bilateral tubal ligation 2007  . History of bilateral tubal ligation 2007  . History of reversal of tubal ligation   . Hypertension   . Low-lying placenta   . Lower extremity edema   . Tubal pregnancy Aug 2016   Social History   Socioeconomic History  . Marital status: Married    Spouse name: Not on file  . Number of children: 4  . Years of education: Not on file  . Highest education level: Not on file  Social Needs  . Financial resource strain: Not on file  . Food insecurity - worry: Not on file  . Food insecurity - inability: Not on file  . Transportation needs - medical: Not on file  . Transportation needs -  non-medical: Not on file  Occupational History  . Not on file  Tobacco Use  . Smoking status: Current Every Day Smoker    Packs/day: 0.50    Years: 15.00    Pack years: 7.50    Types: Cigarettes    Last attempt to quit: 10/18/2015    Years since quitting: 1.1  . Smokeless tobacco: Never Used  Substance and Sexual Activity  . Alcohol use: Yes    Comment: occ.  . Drug use: No  . Sexual activity: Yes  Other Topics Concern  . Not on file  Social History Narrative  . Not on file    Relevant past medical, surgical, family and social history reviewed and updated as indicated. Interim medical history since our last visit reviewed. Allergies and medications reviewed and updated.  Review of Systems  Constitutional: Negative.   HENT: Negative.   Respiratory: Negative.   Cardiovascular: Negative.   Gastrointestinal: Positive for nausea.  Genitourinary: Negative.   Musculoskeletal: Negative.   Neurological: Negative.   Psychiatric/Behavioral: The patient is nervous/anxious.    Per HPI unless specifically indicated above     Objective:    BP 118/75 (BP Location: Left Arm, Patient Position: Sitting, Cuff Size: Normal)   Pulse 91   Temp 98 F (36.7 C) (Oral)  Wt 233 lb 1.6 oz (105.7 kg)   SpO2 91%   BMI 41.29 kg/m   Wt Readings from Last 3 Encounters:  12/22/16 233 lb 1.6 oz (105.7 kg)  11/23/16 233 lb 3.2 oz (105.8 kg)  11/01/16 235 lb (106.6 kg)    Physical Exam  Constitutional: She is oriented to person, place, and time. She appears well-developed and well-nourished.  HENT:  Head: Atraumatic.  Eyes: Conjunctivae are normal. Pupils are equal, round, and reactive to light.  Neck: Normal range of motion. Neck supple.  Cardiovascular: Normal rate and normal heart sounds.  Pulmonary/Chest: Effort normal and breath sounds normal. No respiratory distress.  Musculoskeletal: Normal range of motion.  Neurological: She is alert and oriented to person, place, and time.  Skin:  Skin is warm and dry.  Psychiatric: She has a normal mood and affect. Her behavior is normal.  Nursing note and vitals reviewed.     Assessment & Plan:   Problem List Items Addressed This Visit      Other   Generalized anxiety disorder    Significantly worsened the past month or so. Will start buspar daily and prn xanax rarely for severe episodes. Precautions reviewed, has had good response in the past. Will continue working on getting her in with Psychiatry for further management      Relevant Medications   busPIRone (BUSPAR) 10 MG tablet   ALPRAZolam (XANAX) 0.25 MG tablet   Bipolar affective disorder, currently depressed, moderate (HCC) - Primary    Excellent response to latuda, with mild side effects of nausea. Will hold dose where it's at given improvement and to not worsen side effects. No continued suicidal ideation, able to function better at work. Continue current regimen. Still working on getting psychiatry and counseling set up.       Relevant Medications   busPIRone (BUSPAR) 10 MG tablet   ALPRAZolam (XANAX) 0.25 MG tablet       Follow up plan: Return in about 4 weeks (around 01/19/2017) for Depression f/u.

## 2016-12-24 ENCOUNTER — Other Ambulatory Visit: Payer: Self-pay | Admitting: Family Medicine

## 2016-12-25 DIAGNOSIS — F329 Major depressive disorder, single episode, unspecified: Secondary | ICD-10-CM | POA: Insufficient documentation

## 2016-12-25 DIAGNOSIS — F32A Depression, unspecified: Secondary | ICD-10-CM | POA: Insufficient documentation

## 2016-12-25 NOTE — Assessment & Plan Note (Signed)
Significantly worsened the past month or so. Will start buspar daily and prn xanax rarely for severe episodes. Precautions reviewed, has had good response in the past. Will continue working on getting her in with Psychiatry for further management

## 2016-12-25 NOTE — Assessment & Plan Note (Addendum)
Excellent response to latuda, with mild side effects of nausea. Will hold dose where it's at given improvement and to not worsen side effects. No continued suicidal ideation, able to function better at work. Continue current regimen. Still working on getting psychiatry and counseling set up.

## 2016-12-25 NOTE — Patient Instructions (Signed)
Follow up in one month.

## 2016-12-28 DIAGNOSIS — Z3202 Encounter for pregnancy test, result negative: Secondary | ICD-10-CM | POA: Diagnosis not present

## 2016-12-28 DIAGNOSIS — S63502A Unspecified sprain of left wrist, initial encounter: Secondary | ICD-10-CM | POA: Diagnosis not present

## 2017-01-09 ENCOUNTER — Encounter: Payer: Self-pay | Admitting: Emergency Medicine

## 2017-01-09 ENCOUNTER — Other Ambulatory Visit: Payer: Self-pay

## 2017-01-09 ENCOUNTER — Emergency Department: Payer: BLUE CROSS/BLUE SHIELD

## 2017-01-09 ENCOUNTER — Emergency Department
Admission: EM | Admit: 2017-01-09 | Discharge: 2017-01-09 | Disposition: A | Discharge status: Home / Self Care | Payer: BLUE CROSS/BLUE SHIELD | Attending: Emergency Medicine | Admitting: Emergency Medicine

## 2017-01-09 DIAGNOSIS — F17210 Nicotine dependence, cigarettes, uncomplicated: Secondary | ICD-10-CM | POA: Insufficient documentation

## 2017-01-09 DIAGNOSIS — R079 Chest pain, unspecified: Secondary | ICD-10-CM | POA: Diagnosis not present

## 2017-01-09 DIAGNOSIS — I10 Essential (primary) hypertension: Secondary | ICD-10-CM | POA: Insufficient documentation

## 2017-01-09 DIAGNOSIS — E876 Hypokalemia: Secondary | ICD-10-CM | POA: Insufficient documentation

## 2017-01-09 DIAGNOSIS — Z79899 Other long term (current) drug therapy: Secondary | ICD-10-CM | POA: Diagnosis not present

## 2017-01-09 DIAGNOSIS — Z91040 Latex allergy status: Secondary | ICD-10-CM | POA: Insufficient documentation

## 2017-01-09 DIAGNOSIS — R0789 Other chest pain: Secondary | ICD-10-CM | POA: Diagnosis not present

## 2017-01-09 LAB — CBC
HCT: 37.7 % (ref 35.0–47.0)
HEMOGLOBIN: 12.5 g/dL (ref 12.0–16.0)
MCH: 25.1 pg — AB (ref 26.0–34.0)
MCHC: 33.2 g/dL (ref 32.0–36.0)
MCV: 75.7 fL — ABNORMAL LOW (ref 80.0–100.0)
Platelets: 213 10*3/uL (ref 150–440)
RBC: 4.98 MIL/uL (ref 3.80–5.20)
RDW: 15.7 % — ABNORMAL HIGH (ref 11.5–14.5)
WBC: 9.7 10*3/uL (ref 3.6–11.0)

## 2017-01-09 LAB — COMPREHENSIVE METABOLIC PANEL
ALT: 40 U/L (ref 14–54)
AST: 30 U/L (ref 15–41)
Albumin: 4.3 g/dL (ref 3.5–5.0)
Alkaline Phosphatase: 95 U/L (ref 38–126)
Anion gap: 10 (ref 5–15)
BUN: 17 mg/dL (ref 6–20)
CALCIUM: 9.2 mg/dL (ref 8.9–10.3)
CHLORIDE: 96 mmol/L — AB (ref 101–111)
CO2: 29 mmol/L (ref 22–32)
CREATININE: 0.92 mg/dL (ref 0.44–1.00)
Glucose, Bld: 175 mg/dL — ABNORMAL HIGH (ref 65–99)
Potassium: 2.7 mmol/L — CL (ref 3.5–5.1)
Sodium: 135 mmol/L (ref 135–145)
TOTAL PROTEIN: 7.3 g/dL (ref 6.5–8.1)
Total Bilirubin: 1.4 mg/dL — ABNORMAL HIGH (ref 0.3–1.2)

## 2017-01-09 LAB — HCG, QUANTITATIVE, PREGNANCY: hCG, Beta Chain, Quant, S: <1 m[IU]/mL (ref <5)

## 2017-01-09 LAB — TROPONIN I: Troponin I: <0.03 ng/mL (ref <0.03)

## 2017-01-09 LAB — MAGNESIUM: MAGNESIUM: 1.9 mg/dL (ref 1.7–2.4)

## 2017-01-09 MED ORDER — SODIUM CHLORIDE 0.9 % IV BOLUS (SEPSIS)
1000.0000 mL | Freq: Once | INTRAVENOUS | Status: AC
  Administered 2017-01-09: 1000 mL via INTRAVENOUS

## 2017-01-09 MED ORDER — POTASSIUM CHLORIDE 10 MEQ/100ML IV SOLN
10.0000 meq | Freq: Once | INTRAVENOUS | Status: AC
  Administered 2017-01-09: 10 meq via INTRAVENOUS
  Filled 2017-01-09: qty 100

## 2017-01-09 MED ORDER — ACETAMINOPHEN 500 MG PO TABS
1000.0000 mg | ORAL_TABLET | Freq: Once | ORAL | Status: AC
  Administered 2017-01-09: 1000 mg via ORAL
  Filled 2017-01-09: qty 2

## 2017-01-09 MED ORDER — ASPIRIN 81 MG PO CHEW
324.0000 mg | CHEWABLE_TABLET | Freq: Once | ORAL | Status: AC
  Administered 2017-01-09: 324 mg via ORAL
  Filled 2017-01-09: qty 4

## 2017-01-09 MED ORDER — POTASSIUM CHLORIDE ER 10 MEQ PO TBCR: 10.0000 meq | EXTENDED_RELEASE_TABLET | Freq: Every day | ORAL | 1 refills | Status: DC

## 2017-01-09 MED ORDER — POTASSIUM CHLORIDE CRYS ER 20 MEQ PO TBCR
80.0000 meq | EXTENDED_RELEASE_TABLET | Freq: Once | ORAL | Status: AC
  Administered 2017-01-09: 80 meq via ORAL
  Filled 2017-01-09: qty 4

## 2017-01-09 NOTE — ED Provider Notes (Signed)
[facility] Emergency Department Provider Note  ____________________________________________  Time seen: Approximately 8:14 PM  I have reviewed the triage vital signs and the nursing notes.   HISTORY  Chief Complaint Chest Pain   HPI Holly Nicholson is a 32 y.o. female with a history of high blood pressure, GERD, smoking, depression, anxiety, bipolar who presents for evaluation of chest pain and palpitations.patient reports that she started having palpitations and tightness across her chest that started around noon today. She went to work and was noted to be pale by one of her coworkers. She had her blood pressure taken and it was found to be hypertensive to the 150s and tachycardic with HR 110s. She was then encouraged to come to the emergency room for evaluation. Patient denies ever having similar symptoms. Patient reports that she is on chlorthalidone for bilateral lower extremity edema. According to review of Epic she is supposed to be on potassium supplementation. Patient tells me that she was never made aware that she needed to be on potassium. She is a smoker. She has family history of ischemic heart disease in both her parents. She describes the tightness as mild, located in the center of her chest, constant since noon, nonradiating, not worse with exertion or rest. She denies URI symptoms, SOB, fever or chills, nausea or vomiting. No personal or family history blood clots, no recent travel or immobilization, the pain is not pleuritic, no hemoptysis or exogenous hormones, no leg pain or swelling.  Past Medical History:  Diagnosis Date  . Anxiety   . BMI 45.0-49.9, adult (HCC)   . Depression   . Elevated blood pressure   . GERD (gastroesophageal reflux disease)   . History of bilateral tubal ligation 2007  . History of bilateral tubal ligation 2007  . History of reversal of tubal ligation   . Hypertension   . Low-lying placenta   . Lower extremity  edema   . Tubal pregnancy Aug 2016    Patient Active Problem List   Diagnosis Date Noted  . Depression 12/25/2016  . Female sterility 11/01/2016  . BMI 45.0-49.9, adult (HCC) 04/27/2016  . Influenza B 04/13/2016  . Biliary colic 09/01/2015  . Cholelithiasis without cholecystitis   . Swelling of both ankles 07/21/2015  . Morbid obesity (HCC) 09/06/2014  . Vitamin D deficiency 09/04/2014  . Foot pain, right 09/04/2014  . Generalized anxiety disorder 08/20/2014  . Emesis 08/20/2014  . Bipolar affective disorder, currently depressed, moderate (HCC) 08/20/2014  . Other fatigue 08/20/2014  . Tobacco abuse 08/20/2014  . Preventative health care 08/20/2014  . Transient elevated blood pressure 08/20/2014  . Encounter for sterilization 08/20/2014    Past Surgical History:  Procedure Laterality Date  . CHOLECYSTECTOMY N/A 09/02/2015   Procedure: LAPAROSCOPIC CHOLECYSTECTOMY;  Surgeon: Leafy Roiego F Pabon, MD;  Location: [facility] ORS;  Service: General;  Laterality: N/A;  . KNEE SURGERY    . LAPAROSCOPIC SALPINGO OOPHERECTOMY N/A 10/19/2016   Procedure: LAPAROSCOPIC SALPINGECTOMY bilateral;  Surgeon: Nadara MustardHarris, Robert P, MD;  Location: [facility] ORS;  Service: Gynecology;  Laterality: N/A;  . TUBAL LIGATION    . Tubal reanastamosis      Prior to Admission medications   Medication Sig Start Date End Date Taking? Authorizing Provider  acetaminophen (TYLENOL) 325 MG tablet Take 975 mg by mouth every 6 (six) hours as needed (migraines).    [provider]  ALPRAZolam Prudy Feeler(XANAX) 0.25 MG tablet Take 1 tablet (0.25 mg total) by mouth 2 (two) times daily  as needed for anxiety. 12/22/16   Particia Nearing, PA-C  busPIRone (BUSPAR) 10 MG tablet Take 1 tablet (10 mg total) by mouth 2 (two) times daily. 12/22/16   Particia Nearing, PA-C  chlorthalidone (HYGROTON) 25 MG tablet TAKE 1 TABLET (25 MG TOTAL) DAILY BY MOUTH. 12/25/16   Particia Nearing, PA-C  diphenhydrAMINE (BENADRYL) 25 mg  capsule Take 25 mg by mouth at bedtime as needed for allergies.    [provider]  ibuprofen (ADVIL,MOTRIN) 600 MG tablet Take 1 tablet (600 mg total) by mouth every 6 (six) hours. 06/11/16   Vena Austria, MD  loratadine (CLARITIN) 10 MG tablet Take 10 mg by mouth daily.    [provider]  lurasidone (LATUDA) 20 MG TABS tablet Take 1 tablet (20 mg total) by mouth daily. 12/22/16   Particia Nearing, PA-C  naproxen sodium (ANAPROX) 220 MG tablet Take 440 mg by mouth 2 (two) times daily as needed (migraines).    [provider]  potassium chloride (K-DUR) 10 MEQ tablet Take 1 tablet (10 mEq total) by mouth daily. 01/09/17   Nita Sickle, MD  ranitidine (ZANTAC) 150 MG tablet Take 150 mg by mouth daily as needed for heartburn.    [provider]    Allergies Onion and Latex  Family History  Problem Relation Age of Onset  . Heart disease Mother   . Hyperlipidemia Mother   . Hypertension Mother   . Diabetes Father   . Heart disease Father   . Hyperlipidemia Father   . Hypertension Father   . Stroke Father   . Cancer Maternal Grandfather        lung  . Cancer Paternal Grandmother        stomach  . Epilepsy Daughter   . Asthma Son   . Stroke Paternal Grandfather     Social History Social History   Tobacco Use  . Smoking status: Current Every Day Smoker    Packs/day: 0.25    Years: 15.00    Pack years: 3.75    Types: Cigarettes    Last attempt to quit: 10/18/2015    Years since quitting: 1.2  . Smokeless tobacco: Never Used  Substance Use Topics  . Alcohol use: Yes    Comment: occ.  . Drug use: No    Review of Systems  Constitutional: Negative for fever. Eyes: Negative for visual changes. ENT: Negative for sore throat. Neck: No neck pain  Cardiovascular: + chest tightness and palpitations. Respiratory: Negative for shortness of breath. Gastrointestinal: Negative for abdominal pain, vomiting or diarrhea. Genitourinary:  Negative for dysuria. Musculoskeletal: Negative for back pain. Skin: Negative for rash. Neurological: Negative for headaches, weakness or numbness. Psych: No SI or HI  ____________________________________________   PHYSICAL EXAM:  VITAL SIGNS: ED Triage Vitals  Enc Vitals Group     BP 01/09/17 1601 121/78     Pulse Rate 01/09/17 1601 96     Resp 01/09/17 1601 18     Temp 01/09/17 1601 98.3 F (36.8 C)     Temp Source 01/09/17 1601 Oral     SpO2 01/09/17 1601 100 %     Weight 01/09/17 1603 230 lb (104.3 kg)     Height 01/09/17 1603 5\' 2"  (1.575 m)     Head Circumference --      Peak Flow --      Pain Score 01/09/17 1601 9     Pain Loc --      Pain Edu? --  Excl. in GC? --     Constitutional: Alert and oriented. Well appearing and in no apparent distress. HEENT:      Head: Normocephalic and atraumatic.         Eyes: Conjunctivae are normal. Sclera is non-icteric.       Mouth/Throat: Mucous membranes are moist.       Neck: Supple with no signs of meningismus. Cardiovascular: Regular rate and rhythm. No murmurs, gallops, or rubs. 2+ symmetrical distal pulses are present in all extremities. No JVD. Respiratory: Normal respiratory effort. Lungs are clear to auscultation bilaterally. No wheezes, crackles, or rhonchi.  Gastrointestinal: Soft, non tender, and non distended with positive bowel sounds. No rebound or guarding. Musculoskeletal: Nontender with normal range of motion in all extremities. No edema, cyanosis, or erythema of extremities. Neurologic: Normal speech and language. Face is symmetric. Moving all extremities. No gross focal neurologic deficits are appreciated. Skin: Skin is warm, dry and intact. No rash noted. Psychiatric: Mood and affect are normal. Speech and behavior are normal.  ____________________________________________   LABS (all labs ordered are listed, but only abnormal results are displayed)  Labs Reviewed  CBC - Abnormal; Notable for the  following components:      Result Value   MCV 75.7 (*)    MCH 25.1 (*)    RDW 15.7 (*)    All other components within normal limits  COMPREHENSIVE METABOLIC PANEL - Abnormal; Notable for the following components:   Potassium 2.7 (*)    Chloride 96 (*)    Glucose, Bld 175 (*)    Total Bilirubin 1.4 (*)    All other components within normal limits  TROPONIN I  MAGNESIUM  HCG, QUANTITATIVE, PREGNANCY  TROPONIN I   ____________________________________________  EKG  ED ECG REPORT I, Nita Sickle, the attending physician, personally viewed and interpreted this ECG.  Normal sinus rhythm, rate of 98, normal intervals, normal axis, no ST elevations or depressions.  ____________________________________________  RADIOLOGY  CXR: PND  ____________________________________________   PROCEDURES  Procedure(s) performed: None Procedures Critical Care performed:  None ____________________________________________   INITIAL IMPRESSION / ASSESSMENT AND PLAN / ED COURSE  32 y.o. female with a history of high blood pressure, GERD, smoking, depression, anxiety, bipolar who presents for evaluation of chest tigtness and palpitations since noon today. EKG no evidence of ischemia or dysrhythmia. Patient found to have hypokalemia for which she was supplemented by mouth and IV. Magnesium is normal. No evidence of anemia. Cardiac enzymes 2 negative. Pregnancy is negative. no signs of dehydration. Patient monitor on telemetry with no dysrhythmias. She received aspirin and Tylenol with resolution of her symptoms. Currently receiving K IV. Chest x-ray is pending. Discussed with patient importance of taking potassium while she is on chlorthalidone and explained that the heart is very sensitive to potassium levels and she can have arrhythmias from it. She is PERC negative. Plan to f/u CXR and reassess after IV K and if patient remains stable and asymptomatic will dc home with f/u with PCP. Care  transferred to dr. Fanny Bien.      As part of my medical decision making, I reviewed the following data within the electronic MEDICAL RECORD NUMBER Nursing notes reviewed and incorporated, Labs reviewed , EKG interpreted , Patient signed out to Dr. Fanny Bien, Radiograph reviewed , Notes from prior ED visits and [place] Controlled Substance Database    Pertinent labs & imaging results that were available during my care of the patient were reviewed by me and considered in my medical  decision making (see chart for details).    ____________________________________________   FINAL CLINICAL IMPRESSION(S) / ED DIAGNOSES  Final diagnoses:  Chest pain, unspecified type  Hypokalemia      NEW MEDICATIONS STARTED DURING THIS VISIT:  ED Discharge Orders        Ordered    potassium chloride (K-DUR) 10 MEQ tablet  Daily     01/09/17 2022       Note:  This document was prepared using Dragon voice recognition software and may include unintentional dictation errors.    Nita Sickle, MD 01/09/17 2023

## 2017-01-09 NOTE — ED Provider Notes (Signed)
Patient reports she feels better.  Currently reports that she "feels bored"  She is agreeable and understand with the plan of discharge and follow-up.  Reports she feels better.  Awake alert no distress   Sharyn CreamerQuale, Kayhan Boardley, MD 01/09/17 2202

## 2017-01-09 NOTE — ED Notes (Signed)
Date and time results received: 01/09/17 4:46 PM  (use smartphrase ".now" to insert current time)  Test: potassium Critical Value: 2.7  Name of Provider Notified: Dr. Lamont Snowballifenbark  Orders Received? Or Actions Taken?: Orders Received - See Orders for details

## 2017-01-09 NOTE — ED Triage Notes (Signed)
Chest pain began noon today. States felt like heart was racing. No injury

## 2017-01-09 NOTE — Discharge Instructions (Signed)

## 2017-01-18 ENCOUNTER — Telehealth: Payer: Self-pay | Admitting: Family Medicine

## 2017-01-18 NOTE — Telephone Encounter (Signed)
Pt. Calling in regards to needing a refill on Xanax to [facility] in [place], [place].   Pt . Also stated that she has never taken the medication for Buspar due to her pharmacy not having the medication.   Please Advise.  Thank you

## 2017-01-18 NOTE — Telephone Encounter (Signed)
Per Holly Nicholson's notes these are only to be taken occasionally. I'm not comfortable giving her a refill on this. [facility]. Buspar was received by the pharmacy per computer. Please check on this if it never got there.

## 2017-01-19 ENCOUNTER — Ambulatory Visit: Payer: BLUE CROSS/BLUE SHIELD | Admitting: Family Medicine

## 2017-01-19 MED ORDER — BUSPIRONE HCL 30 MG PO TABS: 15.0000 mg | ORAL_TABLET | Freq: Two times a day (BID) | ORAL | 3 refills | Status: DC

## 2017-01-19 NOTE — Telephone Encounter (Signed)
Just sent over 30 mg buspar for her to cut in half and take half tab twice daily - see if that gets covered. If not, we will have to look into other daily options as the xanax is to be used just prn

## 2017-01-19 NOTE — Telephone Encounter (Signed)
Spoke with patient and she was notified. Spoke with pharmacy and it needs a Prior Authorization. However, that is a covered medication and medicaid doesn't give me the option to do a PA on Buspar.  Pharmacist stated that it could be that it's prescribed as 2 a day as to why it's not covered.

## 2017-01-19 NOTE — Telephone Encounter (Signed)
Patient notified

## 2017-01-20 ENCOUNTER — Other Ambulatory Visit: Payer: Self-pay | Admitting: Family Medicine

## 2017-01-24 ENCOUNTER — Ambulatory Visit (INDEPENDENT_AMBULATORY_CARE_PROVIDER_SITE_OTHER): Payer: BLUE CROSS/BLUE SHIELD | Admitting: Family Medicine

## 2017-01-24 ENCOUNTER — Encounter: Payer: Self-pay | Admitting: Family Medicine

## 2017-01-24 VITALS — BP 110/73 | HR 76 | Temp 98.5°F | Wt 237.7 lb

## 2017-01-24 DIAGNOSIS — F3132 Bipolar disorder, current episode depressed, moderate: Secondary | ICD-10-CM | POA: Diagnosis not present

## 2017-01-24 DIAGNOSIS — F411 Generalized anxiety disorder: Secondary | ICD-10-CM

## 2017-01-24 MED ORDER — LURASIDONE HCL 20 MG PO TABS: 20.0000 mg | ORAL_TABLET | Freq: Every day | ORAL | 2 refills | Status: DC

## 2017-01-24 MED ORDER — ALPRAZOLAM 0.25 MG PO TABS: 0.2500 mg | ORAL_TABLET | Freq: Two times a day (BID) | ORAL | 0 refills | Status: DC | PRN

## 2017-01-24 NOTE — Progress Notes (Signed)
BP 110/73 (BP Location: Right Arm, Patient Position: Sitting, Cuff Size: Normal)   Pulse 76   Temp 98.5 F (36.9 C) (Oral)   Wt 237 lb 11.2 oz (107.8 kg)   SpO2 100%   BMI 43.48 kg/m    Subjective:    Patient ID: Holly Nicholson, female    DOB: 09/30/1985, 32 y.o.   MRN: [id number]  HPI: Holly Nicholson is a 32 y.o. female  Chief Complaint  Patient presents with  . Depression    4 week follow-up, patient states no complaints.  . Medication Management   Patient here today for depression and anxiety f/u. Continues to do well on latuda, noticing major improvements in her depression. Still has some mild nausea side effects but takes before bed to help with that. Does not feel that it's too bothersome. Still having some anxiety issues, has not been able to get the buspar filled yet so has been taking more xanax than she would like to be taking. Hoping to get the buspar today as the pharmacy finally has it ready. Denies further SI, no hallucinations, no debilitating panic episodes. Still waiting on the Psychiatry referral, there was some confusion about insurance coverage at the site.   Relevant past medical, surgical, family and social history reviewed and updated as indicated. Interim medical history since our last visit reviewed. Allergies and medications reviewed and updated.  Review of Systems  Constitutional: Negative.   Respiratory: Negative.   Gastrointestinal: Positive for nausea.  Genitourinary: Negative.   Musculoskeletal: Negative.   Neurological: Negative.   Psychiatric/Behavioral: The patient is nervous/anxious.     Per HPI unless specifically indicated above     Objective:    BP 110/73 (BP Location: Right Arm, Patient Position: Sitting, Cuff Size: Normal)   Pulse 76   Temp 98.5 F (36.9 C) (Oral)   Wt 237 lb 11.2 oz (107.8 kg)   SpO2 100%   BMI 43.48 kg/m   Wt Readings from Last 3 Encounters:  01/24/17 237 lb 11.2 oz (107.8 kg)  01/09/17 230 lb  (104.3 kg)  12/22/16 233 lb 1.6 oz (105.7 kg)    Physical Exam  Constitutional: She is oriented to person, place, and time. She appears well-developed and well-nourished. No distress.  HENT:  Head: Atraumatic.  Eyes: Conjunctivae are normal. Pupils are equal, round, and reactive to light.  Neck: Normal range of motion. Neck supple.  Cardiovascular: Normal rate and normal heart sounds.  Pulmonary/Chest: Effort normal. No respiratory distress.  Abdominal: Soft. Bowel sounds are normal. There is no tenderness.  Musculoskeletal: Normal range of motion.  Neurological: She is alert and oriented to person, place, and time.  Skin: Skin is warm and dry.  Psychiatric: She has a normal mood and affect. Her behavior is normal. Judgment and thought content normal.  Nursing note and vitals reviewed.     Assessment & Plan:   Problem List Items Addressed This Visit      Other   Generalized anxiety disorder - Primary    Start buspar as soon as possible, continue rare use of xanax prn for severe anxiety episodes. Will look into what's going on with Psychiatry referral      Relevant Medications   ALPRAZolam (XANAX) 0.25 MG tablet   Bipolar affective disorder, currently depressed, moderate (HCC)    Continues to do well on latuda, will not dose increase given her side effects.       Relevant Medications   ALPRAZolam (XANAX) 0.25 MG tablet  Follow up plan: Return in about 3 months (around 04/24/2017) for Anxiety f/u.

## 2017-01-26 NOTE — Assessment & Plan Note (Signed)
Continues to do well on latuda, will not dose increase given her side effects.

## 2017-01-26 NOTE — Patient Instructions (Signed)
Follow up as needed

## 2017-01-26 NOTE — Assessment & Plan Note (Signed)
Start buspar as soon as possible, continue rare use of xanax prn for severe anxiety episodes. Will look into what's going on with Psychiatry referral

## 2017-02-24 ENCOUNTER — Other Ambulatory Visit: Payer: Self-pay | Admitting: Family Medicine

## 2017-04-25 ENCOUNTER — Encounter: Payer: Self-pay | Admitting: Family Medicine

## 2017-04-25 ENCOUNTER — Ambulatory Visit (INDEPENDENT_AMBULATORY_CARE_PROVIDER_SITE_OTHER): Payer: BLUE CROSS/BLUE SHIELD | Admitting: Family Medicine

## 2017-04-25 VITALS — BP 115/76 | HR 79 | Temp 98.3°F | Wt 238.6 lb

## 2017-04-25 DIAGNOSIS — F411 Generalized anxiety disorder: Secondary | ICD-10-CM

## 2017-04-25 DIAGNOSIS — R600 Localized edema: Secondary | ICD-10-CM

## 2017-04-25 DIAGNOSIS — M545 Low back pain, unspecified: Secondary | ICD-10-CM

## 2017-04-25 DIAGNOSIS — F3132 Bipolar disorder, current episode depressed, moderate: Secondary | ICD-10-CM

## 2017-04-25 LAB — UA/M W/RFLX CULTURE, ROUTINE
BILIRUBIN UA: NEGATIVE
GLUCOSE, UA: NEGATIVE
Ketones, UA: NEGATIVE
Leukocytes, UA: NEGATIVE
Nitrite, UA: NEGATIVE
PROTEIN UA: NEGATIVE
RBC, UA: NEGATIVE
Specific Gravity, UA: 1.025 (ref 1.005–1.030)
Urobilinogen, Ur: 0.2 mg/dL (ref 0.2–1.0)
pH, UA: 6 (ref 5.0–7.5)

## 2017-04-25 MED ORDER — BUSPIRONE HCL 30 MG PO TABS: 15.0000 mg | ORAL_TABLET | Freq: Two times a day (BID) | ORAL | 3 refills | Status: DC

## 2017-04-25 MED ORDER — LURASIDONE HCL 20 MG PO TABS: 20.0000 mg | ORAL_TABLET | Freq: Every day | ORAL | 2 refills | Status: DC

## 2017-04-25 MED ORDER — CHLORTHALIDONE 50 MG PO TABS: 50.0000 mg | ORAL_TABLET | Freq: Every day | ORAL | 3 refills | Status: DC

## 2017-04-25 MED ORDER — ALPRAZOLAM 0.25 MG PO TABS: 0.2500 mg | ORAL_TABLET | Freq: Two times a day (BID) | ORAL | 0 refills | Status: DC | PRN

## 2017-04-25 NOTE — Progress Notes (Signed)
BP 115/76 (BP Location: Right Arm, Patient Position: Sitting, Cuff Size: Normal)   Pulse 79   Temp 98.3 F (36.8 C) (Oral)   Wt 238 lb 9.6 oz (108.2 kg)   SpO2 100%   BMI 43.64 kg/m    Subjective:    Patient ID: Holly Nicholson, female    DOB: 1985-06-15, 32 y.o.   MRN: [id number]  HPI: Holly Nicholson is a 32 y.o. female  Chief Complaint  Patient presents with  . Anxiety    Patient has not been taking Latuda or Buspar due to insurace change and PA. States she takes the Alprazolam when she needs it, splits in half. Wondering if she could just keep this regimen instead of Latuda and Buspar.   Pt here today for 3 month anxiety f/u. Was previously doing well on latuda and buspar with small supply of xanax on hand for severe anxiety episodes. Has been off her medicines for about 2 months now due to insurance change and coverage. Feels she's doing fairly well controlling things, but having a hard time since losing a close friend of hers recently. Denies SI/HI, severe mood swings, self harm. Has only taken about 2 of the xanax the past month or so, taking 1/2 tab at a time. Feels this is going well for her and hoping to just continue with this medication.   B/l leg edema not improved on current 25 mg chlorthalidone. Does not wear compression hose or elevate legs at rest. 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 Denies leg pain, redness, significant varicosities.   Having some new b/l low back aching, concerned about having a kidney infection. Wanting urine checked today. Denies frequency, dysuria, abdominal pain, fevers, N/V. No recent back injuries or new exercises. Has not tried anything for sxs.   GAD 7 : Generalized Anxiety Score 04/25/2017 12/22/2016 11/23/2016  Nervous, Anxious, on Edge 1 3 3   Control/stop worrying 2 3 3   Worry too much - different things 1 2 3   Trouble relaxing 2 2 3   Restless 2 3 2   Easily annoyed or irritable 2 3 3     Afraid - awful might happen 0 0 1  Total GAD 7 Score 10 16 18   Anxiety Difficulty Somewhat difficult - Very difficult   Depression screen [facility] 2/9 04/25/2017 12/22/2016 11/23/2016  Decreased Interest 0 1 3  Down, Depressed, Hopeless 1 1 3   PHQ - 2 Score 1 2 6   Altered sleeping 2 2 3   Tired, decreased energy 2 1 3   Change in appetite 3 2 3   Feeling bad or failure about yourself  1 2 3   Trouble concentrating 1 0 2  Moving slowly or fidgety/restless 2 0 2  Suicidal thoughts 0 0 3  PHQ-9 Score 12 9 25   Difficult doing work/chores - Somewhat difficult -    Past Medical History:  Diagnosis Date  . Anxiety   . BMI 45.0-49.9, adult (HCC)   . Depression   . Elevated blood pressure   . GERD (gastroesophageal reflux disease)   . History of bilateral tubal ligation 2007  . History of bilateral tubal ligation 2007  . History of reversal of tubal ligation   . Hypertension   . Low-lying placenta   . Lower extremity edema   . Tubal pregnancy Aug 2016   Social History   Socioeconomic History  . Marital status: Married    Spouse name: Not on file  .  Number of children: 4  . Years of education: Not on file  . Highest education level: Not on file  Occupational History  . Not on file  Social Needs  . Financial resource strain: Not on file  . Food insecurity:    Worry: Not on file    Inability: Not on file  . Transportation needs:    Medical: Not on file    Non-medical: Not on file  Tobacco Use  . Smoking status: Current Every Day Smoker    Packs/day: 0.25    Years: 15.00    Pack years: 3.75    Types: Cigarettes    Last attempt to quit: 10/18/2015    Years since quitting: 1.5  . Smokeless tobacco: Never Used  Substance and Sexual Activity  . Alcohol use: Yes    Comment: occ.  . Drug use: No  . Sexual activity: Yes  Lifestyle  . Physical activity:    Days per week: Not on file    Minutes per session: Not on file  . Stress: Not on file  Relationships  . Social  connections:    Talks on phone: Not on file    Gets together: Not on file    Attends religious service: Not on file    Active member of club or organization: Not on file    Attends meetings of clubs or organizations: Not on file    Relationship status: Not on file  . Intimate partner violence:    Fear of current or ex partner: Not on file    Emotionally abused: Not on file    Physically abused: Not on file    Forced sexual activity: Not on file  Other Topics Concern  . Not on file  Social History Narrative  . Not on file   Relevant past medical, surgical, family and social history reviewed and updated as indicated. Interim medical history since our last visit reviewed. Allergies and medications reviewed and updated.  Review of Systems  Per HPI unless specifically indicated above     Objective:    BP 115/76 (BP Location: Right Arm, Patient Position: Sitting, Cuff Size: Normal)   Pulse 79   Temp 98.3 F (36.8 C) (Oral)   Wt 238 lb 9.6 oz (108.2 kg)   SpO2 100%   BMI 43.64 kg/m   Wt Readings from Last 3 Encounters:  04/25/17 238 lb 9.6 oz (108.2 kg)  01/24/17 237 lb 11.2 oz (107.8 kg)  01/09/17 230 lb (104.3 kg)    Physical Exam  Results for orders placed or performed in visit on 04/25/17  Basic Metabolic Panel (BMET)  Result Value Ref Range   Glucose 78 65 - 99 mg/dL   BUN 12 6 - 20 mg/dL   Creatinine, Ser 1.61 0.57 - 1.00 mg/dL   GFR calc non Af Amer 85 >59 mL/min/1.73   GFR calc Af Amer 98 >59 mL/min/1.73   BUN/Creatinine Ratio 13 9 - 23   Sodium 140 134 - 144 mmol/L   Potassium 4.0 3.5 - 5.2 mmol/L   Chloride 103 96 - 106 mmol/L   CO2 24 20 - 29 mmol/L   Calcium 9.2 8.7 - 10.2 mg/dL  UA/M w/rflx Culture, Routine  Result Value Ref Range   Specific Gravity, UA 1.025 1.005 - 1.030   pH, UA 6.0 5.0 - 7.5   Color, UA Yellow Yellow   Appearance Ur Hazy (A) Clear   Leukocytes, UA Negative Negative   Protein, UA Negative Negative/Trace  Glucose, UA Negative  Negative   Ketones, UA Negative Negative   RBC, UA Negative Negative   Bilirubin, UA Negative Negative   Urobilinogen, Ur 0.2 0.2 - 1.0 mg/dL   Nitrite, UA Negative Negative      Assessment & Plan:   Problem List Items Addressed This Visit      Other   Generalized anxiety disorder    Will restart buspar once coverage gets sorted. Continue very rare xanax use for severe flares. Await Psychiatry referral/counseling      Relevant Medications   ALPRAZolam (XANAX) 0.25 MG tablet   busPIRone (BUSPAR) 30 MG tablet   Other Relevant Orders   Ambulatory referral to Psychiatry   Bipolar affective disorder, currently depressed, moderate (HCC) - Primary    Will work on coverage for latuda, medication resent today. Restart asap. Re-placed referral to Psychiatry as there was communication difficulties with prior referral and it was closed      Relevant Medications   ALPRAZolam (XANAX) 0.25 MG tablet   busPIRone (BUSPAR) 30 MG tablet   Other Relevant Orders   Ambulatory referral to Psychiatry    Other Visit Diagnoses    Acute bilateral low back pain without sciatica       Will check U/A today, heating pad, stretches, tylenol or ibuprofen prn if U/A negative. Monitor closely for improvement   Relevant Orders   UA/M w/rflx Culture, Routine (Completed)   Bilateral leg edema       Increase chlorthalidone to 50 mg, watch BPs closely. Push fluids, compression hose, reduce sodium intake, elevate legs, exercise regularly   Relevant Orders   Basic Metabolic Panel (BMET) (Completed)       Follow up plan: Return in about 3 months (around 07/25/2017) for Anxiety, bipolar, leg edema with BMP.

## 2017-04-26 LAB — BASIC METABOLIC PANEL
BUN/Creatinine Ratio: 13 (ref 9–23)
BUN: 12 mg/dL (ref 6–20)
CALCIUM: 9.2 mg/dL (ref 8.7–10.2)
CHLORIDE: 103 mmol/L (ref 96–106)
CO2: 24 mmol/L (ref 20–29)
Creatinine, Ser: 0.89 mg/dL (ref 0.57–1.00)
GFR calc Af Amer: 98 mL/min/{1.73_m2} (ref >59)
GFR calc non Af Amer: 85 mL/min/{1.73_m2} (ref >59)
GLUCOSE: 78 mg/dL (ref 65–99)
Potassium: 4 mmol/L (ref 3.5–5.2)
Sodium: 140 mmol/L (ref 134–144)

## 2017-04-26 NOTE — Assessment & Plan Note (Signed)
Will work on coverage for [employer], medication resent today. Restart asap. Re-placed referral to Psychiatry as there was communication difficulties with prior referral and it was closed

## 2017-04-26 NOTE — Assessment & Plan Note (Signed)
Will restart buspar once coverage gets sorted. Continue very rare xanax use for severe flares. Await Psychiatry referral/counseling

## 2017-04-26 NOTE — Patient Instructions (Signed)
Follow up in 3 months

## 2017-04-27 ENCOUNTER — Encounter: Payer: Self-pay | Admitting: Family Medicine

## 2017-05-14 ENCOUNTER — Ambulatory Visit: Payer: BLUE CROSS/BLUE SHIELD | Admitting: Psychiatry

## 2017-05-17 ENCOUNTER — Encounter: Payer: Self-pay | Admitting: Psychiatry

## 2017-05-17 ENCOUNTER — Ambulatory Visit: Payer: BLUE CROSS/BLUE SHIELD | Admitting: Psychiatry

## 2017-05-17 ENCOUNTER — Ambulatory Visit (INDEPENDENT_AMBULATORY_CARE_PROVIDER_SITE_OTHER): Payer: BLUE CROSS/BLUE SHIELD | Admitting: Psychiatry

## 2017-05-17 ENCOUNTER — Other Ambulatory Visit: Payer: Self-pay

## 2017-05-17 VITALS — BP 130/88 | HR 92 | Temp 98.1°F | Wt 240.2 lb

## 2017-05-17 DIAGNOSIS — F411 Generalized anxiety disorder: Secondary | ICD-10-CM

## 2017-05-17 DIAGNOSIS — F17200 Nicotine dependence, unspecified, uncomplicated: Secondary | ICD-10-CM

## 2017-05-17 DIAGNOSIS — F1211 Cannabis abuse, in remission: Secondary | ICD-10-CM

## 2017-05-17 DIAGNOSIS — F3181 Bipolar II disorder: Secondary | ICD-10-CM

## 2017-05-17 DIAGNOSIS — F1021 Alcohol dependence, in remission: Secondary | ICD-10-CM

## 2017-05-17 MED ORDER — CARBAMAZEPINE ER 100 MG PO TB12: 100.0000 mg | ORAL_TABLET | Freq: Two times a day (BID) | ORAL | 0 refills | Status: DC

## 2017-05-17 NOTE — Progress Notes (Deleted)
BH MD OP Progress Note  05/17/2017 10:37 AM Holly Nicholson  MRN:  [id number]  Chief Complaint:  HPI: *** Visit Diagnosis: No diagnosis found.  Past Psychiatric History: ***  Past Medical History:  Past Medical History:  Diagnosis Date  . Anxiety   . BMI 45.0-49.9, adult (HCC)   . Depression   . Elevated blood pressure   . GERD (gastroesophageal reflux disease)   . History of bilateral tubal ligation 2007  . History of bilateral tubal ligation 2007  . History of reversal of tubal ligation   . Hypertension   . Low-lying placenta   . Lower extremity edema   . Tubal pregnancy Aug 2016    Past Surgical History:  Procedure Laterality Date  . CHOLECYSTECTOMY N/A 09/02/2015   Procedure: LAPAROSCOPIC CHOLECYSTECTOMY;  Surgeon: Leafy Ro, MD;  Location: [facility] ORS;  Service: General;  Laterality: N/A;  . KNEE SURGERY    . LAPAROSCOPIC SALPINGO OOPHERECTOMY N/A 10/19/2016   Procedure: LAPAROSCOPIC SALPINGECTOMY bilateral;  Surgeon: Nadara Mustard, MD;  Location: [facility] ORS;  Service: Gynecology;  Laterality: N/A;  . TUBAL LIGATION    . Tubal reanastamosis      Family Psychiatric History: ***  Family History:  Family History  Problem Relation Age of Onset  . Heart disease Mother   . Hyperlipidemia Mother   . Hypertension Mother   . Diabetes Father   . Heart disease Father   . Hyperlipidemia Father   . Hypertension Father   . Stroke Father   . Cancer Maternal Grandfather        lung  . Cancer Paternal Grandmother        stomach  . Epilepsy Daughter   . Asthma Son   . Stroke Paternal Grandfather     Social History:  Social History   Socioeconomic History  . Marital status: Married    Spouse name: Not on file  . Number of children: 4  . Years of education: Not on file  . Highest education level: Not on file  Occupational History  . Not on file  Social Needs  . Financial resource strain: Not on file  . Food insecurity:    Worry: Not on file    Inability:  Not on file  . Transportation needs:    Medical: Not on file    Non-medical: Not on file  Tobacco Use  . Smoking status: Current Every Day Smoker    Packs/day: 0.25    Years: 15.00    Pack years: 3.75    Types: Cigarettes    Last attempt to quit: 10/18/2015    Years since quitting: 1.5  . Smokeless tobacco: Never Used  Substance and Sexual Activity  . Alcohol use: Yes    Comment: occ.  . Drug use: No  . Sexual activity: Yes  Lifestyle  . Physical activity:    Days per week: Not on file    Minutes per session: Not on file  . Stress: Not on file  Relationships  . Social connections:    Talks on phone: Not on file    Gets together: Not on file    Attends religious service: Not on file    Active member of club or organization: Not on file    Attends meetings of clubs or organizations: Not on file    Relationship status: Not on file  Other Topics Concern  . Not on file  Social History Narrative  . Not on file    Allergies:  Allergies  Allergen Reactions  . Onion Anaphylaxis and Swelling    Raw onions causes throat to swell  . Latex Dermatitis    Metabolic Disorder Labs: No results found for: HGBA1C, MPG No results found for: PROLACTIN Lab Results  Component Value Date   CHOL 154 08/20/2014   TRIG 159 (H) 08/20/2014   HDL 41 08/20/2014   LDLCALC 81 08/20/2014   Lab Results  Component Value Date   TSH 1.180 06/23/2016   TSH 1.810 07/21/2015    Therapeutic Level Labs: No results found for: LITHIUM No results found for: VALPROATE No components found for:  CBMZ  Current Medications: Current Outpatient Medications  Medication Sig Dispense Refill  . acetaminophen (TYLENOL) 325 MG tablet Take 975 mg by mouth every 6 (six) hours as needed (migraines).    . ALPRAZolam (XANAX) 0.25 MG tablet Take 1 tablet (0.25 mg total) by mouth 2 (two) times daily as needed for anxiety. 20 tablet 0  . busPIRone (BUSPAR) 30 MG tablet Take 0.5 tablets (15 mg total) by mouth 2  (two) times daily. 30 tablet 3  . chlorthalidone (HYGROTON) 50 MG tablet Take 1 tablet (50 mg total) by mouth daily. 30 tablet 3  . diphenhydrAMINE (BENADRYL) 25 mg capsule Take 25 mg by mouth at bedtime as needed for allergies.    Marland Kitchen ibuprofen (ADVIL,MOTRIN) 600 MG tablet Take 1 tablet (600 mg total) by mouth every 6 (six) hours. 30 tablet 0  . loratadine (CLARITIN) 10 MG tablet Take 10 mg by mouth daily.    Marland Kitchen lurasidone (LATUDA) 20 MG TABS tablet Take 1 tablet (20 mg total) by mouth daily. 30 tablet 2  . naproxen sodium (ANAPROX) 220 MG tablet Take 440 mg by mouth 2 (two) times daily as needed (migraines).     No current facility-administered medications for this visit.      Musculoskeletal: Strength & Muscle Tone: {desc; muscle tone:32375} Gait & Station: {PE GAIT ED OZHY:86578} Patient leans: {Patient Leans:21022755}  Psychiatric Specialty Exam: ROS  unknown if currently breastfeeding.There is no height or weight on file to calculate BMI.  General Appearance: {Appearance:22683}  Eye Contact:  {BHH EYE CONTACT:22684}  Speech:  {Speech:22685}  Volume:  {Volume (PAA):22686}  Mood:  {BHH MOOD:22306}  Affect:  {Affect (PAA):22687}  Thought Process:  {Thought Process (PAA):22688}  Orientation:  {BHH ORIENTATION (PAA):22689}  Thought Content: {Thought Content:22690}   Suicidal Thoughts:  {ST/HT (PAA):22692}  Homicidal Thoughts:  {ST/HT (PAA):22692}  Memory:  {BHH MEMORY:22881}  Judgement:  {Judgement (PAA):22694}  Insight:  {Insight (PAA):22695}  Psychomotor Activity:  {Psychomotor (PAA):22696}  Concentration:  {Concentration:21399}  Recall:  {BHH GOOD/FAIR/POOR:22877}  Fund of Knowledge: {BHH GOOD/FAIR/POOR:22877}  Language: {BHH GOOD/FAIR/POOR:22877}  Akathisia:  {BHH YES OR NO:22294}  Handed:  {Handed:22697}  AIMS (if indicated): {Desc; done/not:10129}  Assets:  {Assets (PAA):22698}  ADL's:  {BHH ION'G:29528}  Cognition: {chl bhh cognition:304700322}  Sleep:  {BHH  GOOD/FAIR/POOR:22877}   Screenings: GAD-7     Office Visit from 04/25/2017 in [facility] Office Visit from 12/22/2016 in [facility] Office Visit from 11/23/2016 in [facility]  Total GAD-7 Score  PHQ2-9     Office Visit from 04/25/2017 in [facility] Office Visit from 12/22/2016 in [facility] Office Visit from 11/23/2016 in [facility]  PHQ-2 Total Score  PHQ-9 Total Score  Assessment and  Plan: ***   Jomarie Longs, MD 05/17/2017, 10:37 AM
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
## 2017-05-17 NOTE — Patient Instructions (Signed)
Carbamazepine extended-release capsules (bipolar disorder) What is this medicine? CARBAMAZEPINE (kar ba MAZ e peen) is used to treat mixed and manic episodes of bipolar disorder, to control seizures caused by certain types of epilepsy, and to treat nerve related pain. It is not for common aches and pains. This medicine may be used for other purposes; ask your health care provider or pharmacist if you have questions. COMMON BRAND NAME(S): Equetro What should I tell my health care provider before I take this medicine? They need to know if you have any of these conditions: -Asian ancestry -bone marrow disease -glaucoma -heart disease or irregular heartbeat -kidney disease -liver disease -low blood counts, like low white cell, platelet, or red cell counts -porphyria -psychotic disorders -suicidal thoughts, plans, or attempt; a previous suicide attempt by you or a family member -an unusual or allergic reaction to carbamazepine, tricyclic antidepressants, phenytoin, phenobarbital other medicines, foods, dyes, or preservatives -pregnant or trying to get pregnant -breast-feeding How should I use this medicine? Take this medicine by mouth with a glass of water. Follow the directions on the prescription label. Do not cut, crush or chew this medicine. Take this medicine with or without food. The capsules can be opened and the beads sprinkled over food such as applesauce or other similar food product. Take your doses at regular intervals. Do not take your medicine more often than directed. Do not stop taking except on your doctor's advice. A special MedGuide will be given to you by the pharmacist with each prescription and refill. Be sure to read this information carefully each time. Talk to your pediatrician regarding the use of this medicine in children. Special care may be needed. Overdosage: If you think you have taken too much of this medicine contact a poison control center or emergency room at  once. NOTE: This medicine is only for you. Do not share this medicine with others. What if I miss a dose? If you miss a dose, take it as soon as you can. If it is almost time for your next dose, take only that dose. Do not take double or extra doses. What may interact with this medicine? Do not take this medicine with any of the following medications: -certain medicines used to treat HIV infection or AIDS that are given in combination with cobicistat - delavirdine - MAOIs like Carbex, Eldepryl, Marplan, Nardil, and Parnate - nefazodone - oxcarbazepine This medicine may also interact with the following medications: - acetaminophen - acetazolamide - barbiturate medicines for inducing sleep or treating seizures, like phenobarbital - certain antibiotics like clarithromycin, erythromycin or troleandomycin - cimetidine - cyclosporine - danazol - dicumarol - doxycycline - female hormones, including estrogens and birth control pills - grapefruit juice - isoniazid, INH - levothyroxine and other thyroid hormones - lithium and other medicines to treat mood problems or psychotic disturbances - loratadine - medicines for angina or high blood pressure - medicines for cancer - medicines for depression or anxiety - medicines for sleep - medicines to treat fungal infections, like fluconazole, itraconazole or ketoconazole - medicines used to treat HIV infection or AIDS - methadone - niacinamide - praziquantel - propoxyphene - rifampin or rifabutin - seizure or epilepsy medicine - steroid medicines such as prednisone or cortisone - theophylline - tramadol - warfarin This list may not describe all possible interactions. Give your health care provider a list of all the medicines, herbs, non-prescription drugs, or dietary supplements you use. Also tell them if you smoke, drink alcohol, or use illegal drugs.   Some items may interact with your medicine. What  should I watch for while using this medicine? Visit your doctor or health care professional for a regular check on your progress. Do not change brands or dosage forms of this medicine without discussing the change with your doctor or health care professional. You may get drowsy, dizzy, or have blurred vision. Do not drive, use machinery, or do anything that needs mental alertness until you know how this medicine affects you. To reduce dizzy or fainting spells, do not sit or stand up quickly, especially if you are an older patient. Alcohol can increase drowsiness and dizziness. Avoid alcoholic drinks. Birth control pills may not work properly while you are taking this medicine. Talk to your doctor about using an extra method of birth control. This medicine can make you more sensitive to the sun. Keep out of the sun. If you cannot avoid being in the sun, wear protective clothing and use sunscreen. Do not use sun lamps or tanning beds/booths. The use of this medicine may increase the chance of suicidal thoughts or actions. Pay special attention to how you are responding while on this medicine. Any worsening of mood, or thoughts of suicide or dying should be reported to your health care professional right away. Women who become pregnant while using this medicine may enroll in the [place] Antiepileptic Drug Pregnancy Registry by calling 1-888-233-2334. This registry collects information about the safety of antiepileptic drug use during pregnancy. What side effects may I notice from receiving this medicine? Side effects that you should report to your doctor or health care professional as soon as possible: -allergic reactions like skin rash, itching or hives, swelling of the face, lips, or tongue -breathing problems -changes in vision -confusion -dark urine -fast or irregular heartbeat -fever or chills, sore throat -mouth ulcers -pain or difficulty passing urine -redness, blistering, peeling or  loosening of the skin, including inside the mouth -ringing in the ears -seizures -stomach pain -swollen joints or muscle/joint aches and pains -unusual bleeding or bruising -unusually weak or tired -vomiting -worsening of mood, thoughts or actions of suicide or dying -yellowing of the eyes or skin Side effects that usually do not require medical attention (report to your doctor or health care professional if they continue or are bothersome): -clumsiness or unsteadiness -diarrhea or constipation -headache -increased sweating -nausea This list may not describe all possible side effects. Call your doctor for medical advice about side effects. You may report side effects to FDA at 1-800-FDA-1088. Where should I keep my medicine? Keep out of the reach of children. Store at room temperature between 15 and 30 degrees C (59 and 86 degrees C). Protect from light. Throw away any unused medicine after the expiration date. NOTE: This sheet is a summary. It may not cover all possible information. If you have questions about this medicine, talk to your doctor, pharmacist, or health care provider.  2018 Elsevier/Gold Standard (2014-10-26 11:07:18)  

## 2017-05-18 ENCOUNTER — Encounter: Payer: Self-pay | Admitting: Psychiatry

## 2017-06-07 ENCOUNTER — Encounter: Payer: Self-pay | Admitting: Psychiatry

## 2017-06-07 ENCOUNTER — Ambulatory Visit (INDEPENDENT_AMBULATORY_CARE_PROVIDER_SITE_OTHER): Payer: BLUE CROSS/BLUE SHIELD | Admitting: Psychiatry

## 2017-06-07 ENCOUNTER — Other Ambulatory Visit: Payer: Self-pay

## 2017-06-07 VITALS — BP 127/87 | HR 82 | Temp 97.7°F | Wt 236.8 lb

## 2017-06-07 DIAGNOSIS — F1021 Alcohol dependence, in remission: Secondary | ICD-10-CM

## 2017-06-07 DIAGNOSIS — F411 Generalized anxiety disorder: Secondary | ICD-10-CM | POA: Diagnosis not present

## 2017-06-07 DIAGNOSIS — F1211 Cannabis abuse, in remission: Secondary | ICD-10-CM

## 2017-06-07 DIAGNOSIS — F3181 Bipolar II disorder: Secondary | ICD-10-CM | POA: Diagnosis not present

## 2017-06-07 DIAGNOSIS — F17200 Nicotine dependence, unspecified, uncomplicated: Secondary | ICD-10-CM | POA: Diagnosis not present

## 2017-06-07 MED ORDER — VARENICLINE TARTRATE 0.5 MG PO TABS: 0.5000 mg | ORAL_TABLET | Freq: Two times a day (BID) | ORAL | 1 refills | Status: DC

## 2017-06-07 MED ORDER — CARBAMAZEPINE ER 100 MG PO TB12: 100.0000 mg | ORAL_TABLET | Freq: Two times a day (BID) | ORAL | 0 refills | Status: DC

## 2017-06-07 NOTE — Patient Instructions (Signed)
Varenicline oral tablets What is this medicine? VARENICLINE (var EN i kleen) is used to help people quit smoking. It can reduce the symptoms caused by stopping smoking. It is used with a patient support program recommended by your physician. This medicine may be used for other purposes; ask your health care provider or pharmacist if you have questions. COMMON BRAND NAME(S): Chantix What should I tell my health care provider before I take this medicine? They need to know if you have any of these conditions: -bipolar disorder, depression, schizophrenia or other mental illness -heart disease -if you often drink alcohol -kidney disease -peripheral vascular disease -seizures -stroke -suicidal thoughts, plans, or attempt; a previous suicide attempt by you or a family member -an unusual or allergic reaction to varenicline, other medicines, foods, dyes, or preservatives -pregnant or trying to get pregnant -breast-feeding How should I use this medicine? Take this medicine by mouth after eating. Take with a full glass of water. Follow the directions on the prescription label. Take your doses at regular intervals. Do not take your medicine more often than directed. There are 3 ways you can use this medicine to help you quit smoking; talk to your health care professional to decide which plan is right for you: 1) you can choose a quit date and start this medicine 1 week before the quit date, or, 2) you can start taking this medicine before you choose a quit date, and then pick a quit date between day 8 and 35 days of treatment, or, 3) if you are not sure that you are able or willing to quit smoking right away, start taking this medicine and slowly decrease the amount you smoke as directed by your health care professional with the goal of being cigarette-free by week 12 of treatment. Stick to your plan; ask about support groups or other ways to help you remain cigarette-free. If you are motivated to quit  smoking and did not succeed during a previous attempt with this medicine for reasons other than side effects, or if you returned to smoking after this treatment, speak with your health care professional about whether another course of this medicine may be right for you. A special MedGuide will be given to you by the pharmacist with each prescription and refill. Be sure to read this information carefully each time. Talk to your pediatrician regarding the use of this medicine in children. This medicine is not approved for use in children. Overdosage: If you think you have taken too much of this medicine contact a poison control center or emergency room at once. NOTE: This medicine is only for you. Do not share this medicine with others. What if I miss a dose? If you miss a dose, take it as soon as you can. If it is almost time for your next dose, take only that dose. Do not take double or extra doses. What may interact with this medicine? -alcohol or any product that contains alcohol -insulin -other stop smoking aids -theophylline -warfarin This list may not describe all possible interactions. Give your health care provider a list of all the medicines, herbs, non-prescription drugs, or dietary supplements you use. Also tell them if you smoke, drink alcohol, or use illegal drugs. Some items may interact with your medicine. What should I watch for while using this medicine? Visit your doctor or health care professional for regular check ups. Ask for ongoing advice and encouragement from your doctor or healthcare professional, friends, and family to help you quit. If   you smoke while on this medication, quit again Your mouth may get dry. Chewing sugarless gum or sucking hard candy, and drinking plenty of water may help. Contact your doctor if the problem does not go away or is severe. You may get drowsy or dizzy. Do not drive, use machinery, or do anything that needs mental alertness until you know how  this medicine affects you. Do not stand or sit up quickly, especially if you are an older patient. This reduces the risk of dizzy or fainting spells. Sleepwalking can happen during treatment with this medicine, and can sometimes lead to behavior that is harmful to you, other people, or property. Stop taking this medicine and tell your doctor if you start sleepwalking or have other unusual sleep-related activity. Decrease the amount of alcoholic beverages that you drink during treatment with this medicine until you know if this medicine affects your ability to tolerate alcohol. Some people have experienced increased drunkenness (intoxication), unusual or sometimes aggressive behavior, or no memory of things that have happened (amnesia) during treatment with this medicine. The use of this medicine may increase the chance of suicidal thoughts or actions. Pay special attention to how you are responding while on this medicine. Any worsening of mood, or thoughts of suicide or dying should be reported to your health care professional right away. What side effects may I notice from receiving this medicine? Side effects that you should report to your doctor or health care professional as soon as possible: -allergic reactions like skin rash, itching or hives, swelling of the face, lips, tongue, or throat -acting aggressive, being angry or violent, or acting on dangerous impulses -breathing problems -changes in vision -chest pain or chest tightness -confusion, trouble speaking or understanding -new or worsening depression, anxiety, or panic attacks -extreme increase in activity and talking (mania) -fast, irregular heartbeat -feeling faint or lightheaded, falls -fever -pain in legs when walking -problems with balance, talking, walking -redness, blistering, peeling or loosening of the skin, including inside the mouth -ringing in ears -seeing or hearing things that aren't there  (hallucinations) -seizures -sleepwalking -sudden numbness or weakness of the face, arm or leg -thoughts about suicide or dying, or attempts to commit suicide -trouble passing urine or change in the amount of urine -unusual bleeding or bruising -unusually weak or tired Side effects that usually do not require medical attention (report to your doctor or health care professional if they continue or are bothersome): -constipation -headache -nausea, vomiting -strange dreams -stomach gas -trouble sleeping This list may not describe all possible side effects. Call your doctor for medical advice about side effects. You may report side effects to FDA at 1-800-FDA-1088. Where should I keep my medicine? Keep out of the reach of children. Store at room temperature between 15 and 30 degrees C (59 and 86 degrees F). Throw away any unused medicine after the expiration date. NOTE: This sheet is a summary. It may not cover all possible information. If you have questions about this medicine, talk to your doctor, pharmacist, or health care provider.  2018 Elsevier/Gold Standard (2014-09-10 16:14:23)  
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
## 2017-06-08 ENCOUNTER — Encounter: Payer: Self-pay | Admitting: Psychiatry

## 2017-07-04 ENCOUNTER — Ambulatory Visit: Payer: BLUE CROSS/BLUE SHIELD | Admitting: Licensed Clinical Social Worker

## 2017-07-05 ENCOUNTER — Ambulatory Visit: Payer: BLUE CROSS/BLUE SHIELD | Admitting: Psychiatry

## 2017-07-10 ENCOUNTER — Encounter: Payer: Self-pay | Admitting: Family Medicine

## 2017-07-10 ENCOUNTER — Ambulatory Visit (INDEPENDENT_AMBULATORY_CARE_PROVIDER_SITE_OTHER): Payer: BLUE CROSS/BLUE SHIELD | Admitting: Family Medicine

## 2017-07-10 VITALS — BP 106/75 | HR 87 | Temp 98.4°F | Ht 62.0 in | Wt 235.1 lb

## 2017-07-10 DIAGNOSIS — N920 Excessive and frequent menstruation with regular cycle: Secondary | ICD-10-CM | POA: Diagnosis not present

## 2017-07-10 DIAGNOSIS — Z720 Tobacco use: Secondary | ICD-10-CM

## 2017-07-10 DIAGNOSIS — F3132 Bipolar disorder, current episode depressed, moderate: Secondary | ICD-10-CM

## 2017-07-10 NOTE — Progress Notes (Signed)
BP 106/75 (BP Location: Right Arm, Patient Position: Sitting, Cuff Size: Normal)   Pulse 87   Temp 98.4 F (36.9 C) (Oral)   Ht 5\' 2"  (1.575 m)   Wt 235 lb 1.6 oz (106.6 kg)   SpO2 97%   BMI 43.00 kg/m    Subjective:    Patient ID: Holly Nicholson, female    DOB: 01/03/1986, 32 y.o.   MRN: [id number]  HPI: Holly Nicholson is a 32 y.o. female  Chief Complaint  Patient presents with  . Menorrhagia    Ongoing for a year. Can fill tampon and pad within an 90 minutes.   . Dysmenorrhea    Patient states the cramps can be abdominal periods. Family history of endometrosis.   . Family History    Patient has family history of endometriosis (sister) and PCOS (mother)  . Nicotine Dependence    Would like patches.    About a year of heavy menstrual bleeding, started after her last two children. Had b/l tubes removed which didn't seem to help. Having to change pad and tampon every 1.5 hours. Also having painful cramping. Does not have intercourse, unsure of dyspareunia. Fhx of PCOS and endometriosis.  Was prescribed chantix by Psychiatry but not interested in taking it due to possibility of SI given her hx of this. Wanting very much to quit. Wanting a new Psychiatrist referral. Did fairly well on the tegretol but never went to her follow-ups so is not currently on anything for her bipolar disorder. Denies current mania, SI, self harm.   Past Medical History:  Diagnosis Date  . Anxiety   . BMI 45.0-49.9, adult (HCC)   . Depression   . Elevated blood pressure   . GERD (gastroesophageal reflux disease)   . History of bilateral tubal ligation 2007  . History of bilateral tubal ligation 2007  . History of reversal of tubal ligation   . Hypertension   . Low-lying placenta   . Lower extremity edema   . Tubal pregnancy Aug 2016   Social History   Socioeconomic History  . Marital status: Divorced    Spouse name: Not on file  . Number of children: 4  . Years of education: Not on  file  . Highest education level: Some college, no degree  Occupational History  . Not on file  Social Needs  . Financial resource strain: Somewhat hard  . Food insecurity:    Worry: Often true    Inability: Often true  . Transportation needs:    Medical: No    Non-medical: No  Tobacco Use  . Smoking status: Current Every Day Smoker    Packs/day: 0.25    Years: 15.00    Pack years: 3.75    Types: Cigarettes    Last attempt to quit: 10/18/2015    Years since quitting: 1.7  . Smokeless tobacco: Never Used  Substance and Sexual Activity  . Alcohol use: Not Currently    Comment: occ.  . Drug use: No  . Sexual activity: Yes    Birth control/protection: None  Lifestyle  . Physical activity:    Days per week: 0 days    Minutes per session: 0 min  . Stress: Very much  Relationships  . Social connections:    Talks on phone: More than three times a week    Gets together: Never    Attends religious service: More than 4 times per year    Active member of club or organization: No  Attends meetings of clubs or organizations: Never    Relationship status: Divorced  . Intimate partner violence:    Fear of current or ex partner: No    Emotionally abused: No    Physically abused: No    Forced sexual activity: No  Other Topics Concern  . Not on file  Social History Narrative  . Not on file   Relevant past medical, surgical, family and social history reviewed and updated as indicated. Interim medical history since our last visit reviewed. Allergies and medications reviewed and updated.  Review of Systems  Per HPI unless specifically indicated above     Objective:    BP 106/75 (BP Location: Right Arm, Patient Position: Sitting, Cuff Size: Normal)   Pulse 87   Temp 98.4 F (36.9 C) (Oral)   Ht 5\' 2"  (1.575 m)   Wt 235 lb 1.6 oz (106.6 kg)   SpO2 97%   BMI 43.00 kg/m   Wt Readings from Last 3 Encounters:  07/10/17 235 lb 1.6 oz (106.6 kg)  04/25/17 238 lb 9.6 oz (108.2  kg)  01/24/17 237 lb 11.2 oz (107.8 kg)    Physical Exam  Constitutional: She is oriented to person, place, and time. She appears well-developed and well-nourished. No distress.  HENT:  Head: Atraumatic.  Eyes: Conjunctivae are normal.  Neck: Normal range of motion.  Cardiovascular: Normal rate and regular rhythm.  Pulmonary/Chest: Effort normal and breath sounds normal.  Abdominal: Soft. Bowel sounds are normal.  Musculoskeletal: Normal range of motion.  Neurological: She is alert and oriented to person, place, and time.  Skin: Skin is warm and dry.  Psychiatric: She has a normal mood and affect. Her behavior is normal.  Nursing note and vitals reviewed.   Results for orders placed or performed in visit on 04/25/17  Basic Metabolic Panel (BMET)  Result Value Ref Range   Glucose 78 65 - 99 mg/dL   BUN 12 6 - 20 mg/dL   Creatinine, Ser 4.09 0.57 - 1.00 mg/dL   GFR calc non Af Amer 85 >59 mL/min/1.73   GFR calc Af Amer 98 >59 mL/min/1.73   BUN/Creatinine Ratio 13 9 - 23   Sodium 140 134 - 144 mmol/L   Potassium 4.0 3.5 - 5.2 mmol/L   Chloride 103 96 - 106 mmol/L   CO2 24 20 - 29 mmol/L   Calcium 9.2 8.7 - 10.2 mg/dL  UA/M w/rflx Culture, Routine  Result Value Ref Range   Specific Gravity, UA 1.025 1.005 - 1.030   pH, UA 6.0 5.0 - 7.5   Color, UA Yellow Yellow   Appearance Ur Hazy (A) Clear   Leukocytes, UA Negative Negative   Protein, UA Negative Negative/Trace   Glucose, UA Negative Negative   Ketones, UA Negative Negative   RBC, UA Negative Negative   Bilirubin, UA Negative Negative   Urobilinogen, Ur 0.2 0.2 - 1.0 mg/dL   Nitrite, UA Negative Negative      Assessment & Plan:   Problem List Items Addressed This Visit      Other   Bipolar affective disorder, currently depressed, moderate (HCC) - Primary    Pt requesting new referral for Psychiatry for second opinion. Currently off her medications and does not want to restart until this appt. Referral placed, and  [facility] walk in clinic information reviewed in case needed      Relevant Orders   Ambulatory referral to Psychiatry   Tobacco abuse    Does not want to start  the chantix that was given by previous Psychiatrist as she has a hx of SI and is concerned it would cause bad thoughts. Wanting to try zyban. Discussed with pt that until she is back on a mood stabilizer for her bipolar disorder it would not be recommended. Pt will start on patches and await new Psychiatry consult.        Other Visit Diagnoses    Menorrhagia with regular cycle       Interested in IUD for hormonal control of cycle bleeding. Already established at GYN, will call and schedule consult about this. Declines u/s or labs today       Follow up plan: Return for CPE.

## 2017-07-12 NOTE — Patient Instructions (Signed)
Follow up for CPE 

## 2017-07-12 NOTE — Assessment & Plan Note (Signed)
Pt requesting new referral for Psychiatry for second opinion. Currently off her medications and does not want to restart until this appt. Referral placed, and [facility] walk in clinic information reviewed in case needed

## 2017-07-12 NOTE — Assessment & Plan Note (Signed)
Does not want to start the chantix that was given by previous Psychiatrist as she has a hx of SI and is concerned it would cause bad thoughts. Wanting to try zyban. Discussed with pt that until she is back on a mood stabilizer for her bipolar disorder it would not be recommended. Pt will start on patches and await new Psychiatry consult.

## 2017-07-20 ENCOUNTER — Encounter: Payer: Self-pay | Admitting: Obstetrics and Gynecology

## 2017-07-20 ENCOUNTER — Ambulatory Visit (INDEPENDENT_AMBULATORY_CARE_PROVIDER_SITE_OTHER): Payer: BLUE CROSS/BLUE SHIELD | Admitting: Obstetrics and Gynecology

## 2017-07-20 VITALS — BP 128/78 | HR 102 | Ht 63.0 in | Wt 230.0 lb

## 2017-07-20 DIAGNOSIS — Z3009 Encounter for other general counseling and advice on contraception: Secondary | ICD-10-CM | POA: Diagnosis not present

## 2017-07-21 NOTE — Progress Notes (Signed)
Obstetrics & Gynecology Office Visit   Chief Complaint:  Chief Complaint  Patient presents with  . Contraception    Possible IUD    History of Present Illness: Patient is a 32 y.o. [id number] presenting for contraception consult.  She is currently on tubal ligation and desiring to start IUD.  She has a past medical history significant for no contraindication to estrogen.  She specifically denies a history of migraine with aura, chronic hypertension, history of DVT/PE and smoking.  Reported Patient's last menstrual period was 06/29/2017 (approximate).   She is interested in Mirena for cycle control.  Cycles remain regular monthly but have gotten increasingly heavy, with patient having to wear pads and tampons.  Last over a week at times.    Review of Systems: Review of Systems  Constitutional: Negative.   Gastrointestinal: Negative.   Neurological: Negative.      Past Medical History:  Past Medical History:  Diagnosis Date  . Anxiety   . BMI 45.0-49.9, adult (HCC)   . Depression   . Elevated blood pressure   . GERD (gastroesophageal reflux disease)   . History of bilateral tubal ligation 2007  . History of bilateral tubal ligation 2007  . History of reversal of tubal ligation   . Hypertension   . Low-lying placenta   . Lower extremity edema   . Tubal pregnancy Aug 2016    Past Surgical History:  Past Surgical History:  Procedure Laterality Date  . CHOLECYSTECTOMY N/A 09/02/2015   Procedure: LAPAROSCOPIC CHOLECYSTECTOMY;  Surgeon: Leafy Ro, MD;  Location: [facility] ORS;  Service: General;  Laterality: N/A;  . KNEE SURGERY    . LAPAROSCOPIC SALPINGO OOPHERECTOMY N/A 10/19/2016   Procedure: LAPAROSCOPIC SALPINGECTOMY bilateral;  Surgeon: Nadara Mustard, MD;  Location: [facility] ORS;  Service: Gynecology;  Laterality: N/A;  . TUBAL LIGATION    . Tubal reanastamosis      Gynecologic History: Patient's last menstrual period was 06/29/2017 (approximate).  Obstetric  History: [id number]  Family History:  Family History  Problem Relation Age of Onset  . Heart disease Mother   . Hyperlipidemia Mother   . Hypertension Mother   . Depression Mother   . Anxiety disorder Mother   . Diabetes Father   . Heart disease Father   . Hyperlipidemia Father   . Hypertension Father   . Stroke Father   . Cancer Maternal Grandfather        lung  . Cancer Paternal Grandmother        stomach  . Epilepsy Daughter   . Asthma Son   . Stroke Paternal Grandfather   . Depression Maternal Aunt   . Anxiety disorder Maternal Aunt     Social History:  Social History   Socioeconomic History  . Marital status: Divorced    Spouse name: Not on file  . Number of children: 4  . Years of education: Not on file  . Highest education level: Some college, no degree  Occupational History  . Not on file  Social Needs  . Financial resource strain: Somewhat hard  . Food insecurity:    Worry: Often true    Inability: Often true  . Transportation needs:    Medical: No    Non-medical: No  Tobacco Use  . Smoking status: Current Every Day Smoker    Packs/day: 0.25    Years: 15.00    Pack years: 3.75    Types: Cigarettes    Last attempt to quit: 10/18/2015  Years since quitting: 1.7  . Smokeless tobacco: Never Used  Substance and Sexual Activity  . Alcohol use: Not Currently    Comment: occ.  . Drug use: No  . Sexual activity: Yes    Birth control/protection: None  Lifestyle  . Physical activity:    Days per week: 0 days    Minutes per session: 0 min  . Stress: Very much  Relationships  . Social connections:    Talks on phone: More than three times a week    Gets together: Never    Attends religious service: More than 4 times per year    Active member of club or organization: No    Attends meetings of clubs or organizations: Never    Relationship status: Divorced  . Intimate partner violence:    Fear of current or ex partner: No    Emotionally abused: No     Physically abused: No    Forced sexual activity: No  Other Topics Concern  . Not on file  Social History Narrative  . Not on file    Allergies:  Allergies  Allergen Reactions  . Onion Anaphylaxis and Swelling    Raw onions causes throat to swell  . Latex Dermatitis    Medications: Prior to Admission medications   Medication Sig Start Date End Date Taking? Authorizing Provider  acetaminophen (TYLENOL) 325 MG tablet Take 975 mg by mouth every 6 (six) hours as needed (migraines).   Yes [provider]  diphenhydrAMINE (BENADRYL) 25 mg capsule Take 25 mg by mouth at bedtime as needed for allergies.   Yes [provider]  loratadine (CLARITIN) 10 MG tablet Take 10 mg by mouth daily.   Yes [provider]  naproxen sodium (ANAPROX) 220 MG tablet Take 440 mg by mouth 2 (two) times daily as needed (migraines).   Yes [provider]  carbamazepine (TEGRETOL-XR) 100 MG 12 hr tablet Take 1 tablet (100 mg total) by mouth 2 (two) times daily. Patient not taking: Reported on 07/10/2017 06/07/17   Jomarie Longs, MD  chlorthalidone (HYGROTON) 50 MG tablet Take 1 tablet (50 mg total) by mouth daily. Patient not taking: Reported on 07/10/2017 04/25/17   Particia Nearing, PA-C    Physical Exam Vitals:  Vitals:   07/20/17 1350  BP: 128/78  Pulse: (!) 102   Patient's last menstrual period was 06/29/2017 (approximate).  General: NAD HEENT: normocephalic, anicteric Pulmonary: No increased work of breathing Neurologic: Grossly intact Psychiatric: mood appropriate, affect full  Assessment: 32 y.o. [id number] contraception consult  Plan: Problem List Items Addressed This Visit    None    Visit Diagnoses    Encounter for counseling regarding contraception    -  Primary     1) Patient plans IUD, and will schedule soon.  Risks and benefits of IUD discussed including the risks of irregular bleeding, cramping, infection, malpositioning, expulsion or uterine  perforation of the IUD (1:1000 placements)  which may require further procedures such as laparoscopy.  IUDs while effective at preventing pregnancy do not prevent transmission of sexually transmitted diseases and use of barrier methods for this purpose was discussed.  Low overall incidence of failure with 99.7% efficacy rate in typical use.  The patient has not contraindication to IUD placement. - call with next menses to arrange placement  2) A total of 15 minutes were spent in face-to-face contact with the patient during this encounter with over half of that time devoted to counseling and coordination of care.  3) Return call with next menses IUD insertion.   Vena AustriaAndreas Aleksandr Pellow, MD, Merlinda FrederickFACOG [facility], [facility]

## 2017-07-27 ENCOUNTER — Ambulatory Visit: Payer: BLUE CROSS/BLUE SHIELD | Admitting: Family Medicine

## 2017-08-13 ENCOUNTER — Telehealth: Payer: Self-pay | Admitting: Obstetrics and Gynecology

## 2017-08-13 NOTE — Telephone Encounter (Signed)
Pt coming in on 08/14/2017 for mirena insertion with AMS

## 2017-08-14 ENCOUNTER — Encounter: Payer: Self-pay | Admitting: Obstetrics and Gynecology

## 2017-08-14 ENCOUNTER — Ambulatory Visit (INDEPENDENT_AMBULATORY_CARE_PROVIDER_SITE_OTHER): Payer: BLUE CROSS/BLUE SHIELD | Admitting: Obstetrics and Gynecology

## 2017-08-14 VITALS — BP 110/72 | HR 89 | Ht 62.0 in | Wt 232.0 lb

## 2017-08-14 DIAGNOSIS — Z30430 Encounter for insertion of intrauterine contraceptive device: Secondary | ICD-10-CM

## 2017-08-14 NOTE — Telephone Encounter (Signed)
Mirena reserved for this patient. 

## 2017-08-14 NOTE — Progress Notes (Signed)
   GYNECOLOGY OFFICE PROCEDURE NOTE  Holly Nicholson is a 32 y.o. [id number] here for a Mirena IUD insertion. No GYN concerns.  Last pap smear was on 08/02/2016 and was normal.  The patient is currently using salpingectomy for contraception and her LMP is Patient's last menstrual period was 08/13/2017 (exact date)..  The indication for her IUD is contraception/cycle control.  IUD Insertion Procedure Note Patient identified, informed consent performed, consent signed.   Discussed risks of irregular bleeding, cramping, infection, malpositioning, expulsion or uterine perforation of the IUD (1:1000 placements)  which may require further procedure such as laparoscopy.  IUD while effective at preventing pregnancy do not prevent transmission of sexually transmitted diseases and use of barrier methods for this purpose was discussed. Time out was performed.  Urine pregnancy test negative.  Speculum placed in the vagina.  Cervix visualized.  Cleaned with Betadine x 2.  Grasped anteriorly with a single tooth tenaculum.  Uterus sounded to 8 cm. IUD placed per manufacturer's recommendations.  Strings trimmed to 3 cm. Tenaculum was removed, good hemostasis noted.  Patient tolerated procedure well.   Patient was given post-procedure instructions.  She was advised to have backup contraception for one week.  Patient was also asked to check IUD strings periodically and follow up in 6 weeks for IUD check.  Vena AustriaAndreas Latara Micheli, MD, Merlinda FrederickFACOG [facility], [facility]

## 2017-09-05 NOTE — Telephone Encounter (Signed)
Mirena rcvd/charged 08/14/17 

## 2017-09-25 ENCOUNTER — Ambulatory Visit (INDEPENDENT_AMBULATORY_CARE_PROVIDER_SITE_OTHER): Payer: BLUE CROSS/BLUE SHIELD | Admitting: Obstetrics and Gynecology

## 2017-09-25 ENCOUNTER — Encounter: Payer: Self-pay | Admitting: Obstetrics and Gynecology

## 2017-09-25 ENCOUNTER — Other Ambulatory Visit: Payer: BLUE CROSS/BLUE SHIELD

## 2017-09-25 VITALS — BP 106/74 | HR 94 | Ht 62.0 in | Wt 227.0 lb

## 2017-09-25 DIAGNOSIS — N946 Dysmenorrhea, unspecified: Secondary | ICD-10-CM | POA: Diagnosis not present

## 2017-09-25 DIAGNOSIS — Z30431 Encounter for routine checking of intrauterine contraceptive device: Secondary | ICD-10-CM

## 2017-09-25 NOTE — Progress Notes (Addendum)
Obstetrics & Gynecology Office Visit   Chief Complaint:  Chief Complaint  Patient presents with  . Follow-up    IUD check  . Rash on arms    itchy    History of Present Illness: 32 y.o. patient presenting for follow up of Mirena IUD placement 6+ weeks ago.  The indication for her IUD was cycle control.  She admits to any complications since her IUD placement, continued cramping and bleeding.  Still having some occasional spotting.  is able to feel strings.    Review of Systems: Review of Systems  Constitutional: Negative.   Gastrointestinal: Positive for abdominal pain. Negative for blood in stool, constipation, diarrhea, heartburn, melena, nausea and vomiting.  Genitourinary: Negative.     Past Medical History:  Past Medical History:  Diagnosis Date  . Anxiety   . BMI 45.0-49.9, adult (HCC)   . Depression   . Elevated blood pressure   . GERD (gastroesophageal reflux disease)   . History of bilateral tubal ligation 2007  . History of bilateral tubal ligation 2007  . History of reversal of tubal ligation   . Hypertension   . Low-lying placenta   . Lower extremity edema   . Tubal pregnancy Aug 2016    Past Surgical History:  Past Surgical History:  Procedure Laterality Date  . CHOLECYSTECTOMY N/A 09/02/2015   Procedure: LAPAROSCOPIC CHOLECYSTECTOMY;  Surgeon: Leafy Ro, MD;  Location: [facility] ORS;  Service: General;  Laterality: N/A;  . KNEE SURGERY    . LAPAROSCOPIC SALPINGO OOPHERECTOMY N/A 10/19/2016   Procedure: LAPAROSCOPIC SALPINGECTOMY bilateral;  Surgeon: Nadara Mustard, MD;  Location: [facility] ORS;  Service: Gynecology;  Laterality: N/A;  . TUBAL LIGATION    . Tubal reanastamosis      Gynecologic History: No LMP recorded. (Menstrual status: IUD).  Obstetric History: [id number]  Family History:  Family History  Problem Relation Age of Onset  . Heart disease Mother   . Hyperlipidemia Mother   . Hypertension Mother   . Depression Mother   . Anxiety  disorder Mother   . Diabetes Father   . Heart disease Father   . Hyperlipidemia Father   . Hypertension Father   . Stroke Father   . Cancer Maternal Grandfather        lung  . Cancer Paternal Grandmother        stomach  . Epilepsy Daughter   . Asthma Son   . Stroke Paternal Grandfather   . Depression Maternal Aunt   . Anxiety disorder Maternal Aunt     Social History:  Social History   Socioeconomic History  . Marital status: Divorced    Spouse name: Not on file  . Number of children: 4  . Years of education: Not on file  . Highest education level: Some college, no degree  Occupational History  . Not on file  Social Needs  . Financial resource strain: Somewhat hard  . Food insecurity:    Worry: Often true    Inability: Often true  . Transportation needs:    Medical: No    Non-medical: No  Tobacco Use  . Smoking status: Current Every Day Smoker    Packs/day: 0.25    Years: 15.00    Pack years: 3.75    Types: Cigarettes    Last attempt to quit: 10/18/2015    Years since quitting: 1.9  . Smokeless tobacco: Never Used  Substance and Sexual Activity  . Alcohol use: Not Currently  Comment: occ.  . Drug use: No  . Sexual activity: Yes    Birth control/protection: None  Lifestyle  . Physical activity:    Days per week: 0 days    Minutes per session: 0 min  . Stress: Very much  Relationships  . Social connections:    Talks on phone: More than three times a week    Gets together: Never    Attends religious service: More than 4 times per year    Active member of club or organization: No    Attends meetings of clubs or organizations: Never    Relationship status: Divorced  . Intimate partner violence:    Fear of current or ex partner: No    Emotionally abused: No    Physically abused: No    Forced sexual activity: No  Other Topics Concern  . Not on file  Social History Narrative  . Not on file    Allergies:  Allergies  Allergen Reactions  . Onion  Anaphylaxis and Swelling    Raw onions causes throat to swell  . Latex Dermatitis    Medications: Prior to Admission medications   Medication Sig Start Date End Date Taking? Authorizing Provider  acetaminophen (TYLENOL) 325 MG tablet Take 975 mg by mouth every 6 (six) hours as needed (migraines).   Yes [provider]  chlorthalidone (HYGROTON) 50 MG tablet Take 1 tablet (50 mg total) by mouth daily. 04/25/17  Yes Particia NearingLane, [name] Elizabeth, PA-C  diphenhydrAMINE (BENADRYL) 25 mg capsule Take 25 mg by mouth at bedtime as needed for allergies.   Yes [provider]  loratadine (CLARITIN) 10 MG tablet Take 10 mg by mouth daily.   Yes [provider]  naproxen sodium (ANAPROX) 220 MG tablet Take 440 mg by mouth 2 (two) times daily as needed (migraines).   Yes [provider]  carbamazepine (TEGRETOL-XR) 100 MG 12 hr tablet Take 1 tablet (100 mg total) by mouth 2 (two) times daily. Patient not taking: Reported on 07/10/2017 06/07/17   Jomarie LongsEappen, Saramma, MD    Physical Exam Blood pressure 106/74, pulse 94, height 5\' 2"  (1.575 m), weight 227 lb (103 kg), not currently breastfeeding. No LMP recorded. (Menstrual status: IUD).  General: NAD HEENT: normocephalic, anicteric Pulmonary: No increased work of breathing  Genitourinary:  External: Normal external female genitalia.  Normal urethral meatus, normal  Bartholin's and Skene's glands.    Vagina: Normal vaginal mucosa, no evidence of prolapse.    Cervix: Grossly normal in appearance, no bleeding, IUD strings visualized 2cm  Uterus: Non-enlarged, mobile, normal contour.  No CMT  Adnexa: ovaries non-enlarged, no adnexal masses  Rectal: deferred  Lymphatic: no evidence of inguinal lymphadenopathy Extremities: no edema, erythema, or tenderness Neurologic: Grossly intact Psychiatric: mood appropriate, affect full  Female chaperone present for pelvic and breast  portions of the physical exam  Assessment: 32 y.o.  [id number] No problem-specific Assessment & Plan notes found for this encounter.   Plan: Problem List Items Addressed This Visit    None    Visit Diagnoses    IUD check up    -  Primary   Relevant Orders   US Transvaginal Non-OB   Dysmenorrhea       Relevant Orders   US Transvaginal Non-OB       1.  The patient was given instructions to check her IUD strings monthly and call with any problems or concerns.  She should call for fevers, chills, abnormal vaginal discharge, pelvic pain, or other  complaints. - given continued cramping will check location with ultrasound, strings appropriate length today.  Patient unable to stay for ultrasound today will give additional 4 weeks to see if cramping and bleeding subsides.  If continues not a good ablation candidate given age and failure rate, is interested in possible hysterectomy  2.   IUDs while effective at preventing pregnancy do not prevent transmission of sexually transmitted diseases and use of barrier methods for this purpose was discussed.  Low overall incidence of failure with 99.7% efficacy rate in typical use.  The patient has not contraindication to IUD placement.  3.  She will return for a annual exam in 1 year.  All questions answered.  4) A total of 15 minutes were spent in face-to-face contact with the patient during this encounter with over half of that time devoted to counseling and coordination of care.  5) Return in about 1 year (around 09/26/2018) for Annual (will call with ultrasound results).   Vena Austria, MD, Evern Core [facility], [facility] 09/25/2017, 3:32 PM

## 2017-09-28 ENCOUNTER — Telehealth: Payer: Self-pay

## 2017-09-28 NOTE — Telephone Encounter (Signed)
Needs ultrasound first I need to know what we're doing surgery on right.  Is there a cyst etc, or do we just remove the IUD and see if symptoms improve.

## 2017-09-28 NOTE — Telephone Encounter (Signed)
Pt called triage stating she needs AMS nurse to call her.

## 2017-09-28 NOTE — Telephone Encounter (Signed)
Pt is wanting to go ahead with surgery. Pain has gotten worse over the last couple of days.

## 2017-09-28 NOTE — Telephone Encounter (Signed)
Holly Nicholson please schedule U/S

## 2017-10-01 NOTE — Telephone Encounter (Signed)
Dr. Bonney AidStaebler is this ok to over book or book just ultrasound and you call patient, Next Ultrasound we have with follow up is 10/11/17. Please advise due to possible surgery request

## 2017-10-01 NOTE — Telephone Encounter (Signed)
Either 10/11/17 or she can have the ultrasound and I can call with results.  No overbook we actually offered to do it same day last visit but patient declined

## 2017-10-01 NOTE — Telephone Encounter (Signed)
Patient is schedule 10/11/17

## 2017-10-11 ENCOUNTER — Ambulatory Visit (INDEPENDENT_AMBULATORY_CARE_PROVIDER_SITE_OTHER): Payer: BLUE CROSS/BLUE SHIELD

## 2017-10-11 ENCOUNTER — Ambulatory Visit (INDEPENDENT_AMBULATORY_CARE_PROVIDER_SITE_OTHER): Payer: BLUE CROSS/BLUE SHIELD | Admitting: Obstetrics and Gynecology

## 2017-10-11 ENCOUNTER — Other Ambulatory Visit (HOSPITAL_COMMUNITY)
Admission: RE | Admit: 2017-10-11 | Discharge: 2017-10-11 | Disposition: A | Discharge status: Home / Self Care | Payer: BLUE CROSS/BLUE SHIELD | Source: Ambulatory Visit | Attending: Obstetrics and Gynecology | Admitting: Obstetrics and Gynecology

## 2017-10-11 ENCOUNTER — Encounter: Payer: Self-pay | Admitting: Obstetrics and Gynecology

## 2017-10-11 VITALS — BP 128/68 | HR 93 | Wt 228.0 lb

## 2017-10-11 DIAGNOSIS — Z30431 Encounter for routine checking of intrauterine contraceptive device: Secondary | ICD-10-CM

## 2017-10-11 DIAGNOSIS — N946 Dysmenorrhea, unspecified: Secondary | ICD-10-CM

## 2017-10-11 DIAGNOSIS — N939 Abnormal uterine and vaginal bleeding, unspecified: Secondary | ICD-10-CM | POA: Diagnosis not present

## 2017-10-11 NOTE — Progress Notes (Signed)
Gynecology Ultrasound Follow Up  Chief Complaint:  Chief Complaint  Patient presents with  . Follow-up    GYN U/S     History of Present Illness: Patient is a 32 y.o. female who presents today for ultrasound evaluation of abnormal uterine bleeding with Mirena IUD .  Ultrasound demonstrates the following findgins Adnexa: no masses seen  Uterus: Non-enlared with endometrial stripe  Thickened in the setting of Mirena IUD, otherwise no focal endometrial lesions with IUD in proper position Additional: no ffree fluid  Review of Systems: Review of systems negative unless otherwise noted in HPI  Past Medical History:  Past Medical History:  Diagnosis Date  . Anxiety   . BMI 45.0-49.9, adult (HCC)   . Depression   . Elevated blood pressure   . GERD (gastroesophageal reflux disease)   . History of bilateral tubal ligation 2007  . History of bilateral tubal ligation 2007  . History of reversal of tubal ligation   . Hypertension   . Low-lying placenta   . Lower extremity edema   . Tubal pregnancy Aug 2016    Past Surgical History:  Past Surgical History:  Procedure Laterality Date  . CHOLECYSTECTOMY N/A 09/02/2015   Procedure: LAPAROSCOPIC CHOLECYSTECTOMY;  Surgeon: Leafy Ro, MD;  Location: [facility] ORS;  Service: General;  Laterality: N/A;  . KNEE SURGERY    . LAPAROSCOPIC SALPINGO OOPHERECTOMY N/A 10/19/2016   Procedure: LAPAROSCOPIC SALPINGECTOMY bilateral;  Surgeon: Nadara Mustard, MD;  Location: [facility] ORS;  Service: Gynecology;  Laterality: N/A;  . TUBAL LIGATION    . Tubal reanastamosis      Gynecologic History:  No LMP recorded. (Menstrual status: IUD). Contraception: IUD   Family History:  Family History  Problem Relation Age of Onset  . Heart disease Mother   . Hyperlipidemia Mother   . Hypertension Mother   . Depression Mother   . Anxiety disorder Mother   . Diabetes Father   . Heart disease Father   . Hyperlipidemia Father   . Hypertension Father     . Stroke Father   . Cancer Maternal Grandfather        lung  . Cancer Paternal Grandmother        stomach  . Epilepsy Daughter   . Asthma Son   . Stroke Paternal Grandfather   . Depression Maternal Aunt   . Anxiety disorder Maternal Aunt     Social History:  Social History   Socioeconomic History  . Marital status: Divorced    Spouse name: Not on file  . Number of children: 4  . Years of education: Not on file  . Highest education level: Some college, no degree  Occupational History  . Not on file  Social Needs  . Financial resource strain: Somewhat hard  . Food insecurity:    Worry: Often true    Inability: Often true  . Transportation needs:    Medical: No    Non-medical: No  Tobacco Use  . Smoking status: Current Every Day Smoker    Packs/day: 0.25    Years: 15.00    Pack years: 3.75    Types: Cigarettes    Last attempt to quit: 10/18/2015    Years since quitting: 1.9  . Smokeless tobacco: Never Used  Substance and Sexual Activity  . Alcohol use: Not Currently    Comment: occ.  . Drug use: No  . Sexual activity: Yes    Birth control/protection: None  Lifestyle  . Physical activity:  Days per week: 0 days    Minutes per session: 0 min  . Stress: Very much  Relationships  . Social connections:    Talks on phone: More than three times a week    Gets together: Never    Attends religious service: More than 4 times per year    Active member of club or organization: No    Attends meetings of clubs or organizations: Never    Relationship status: Divorced  . Intimate partner violence:    Fear of current or ex partner: No    Emotionally abused: No    Physically abused: No    Forced sexual activity: No  Other Topics Concern  . Not on file  Social History Narrative  . Not on file    Allergies:  Allergies  Allergen Reactions  . Onion Anaphylaxis and Swelling    Raw onions causes throat to swell  . Latex Dermatitis    Medications: Prior to  Admission medications   Medication Sig Start Date End Date Taking? Authorizing Provider  acetaminophen (TYLENOL) 325 MG tablet Take 975 mg by mouth every 6 (six) hours as needed (migraines).    [provider]  carbamazepine (TEGRETOL-XR) 100 MG 12 hr tablet Take 1 tablet (100 mg total) by mouth 2 (two) times daily. Patient not taking: Reported on 07/10/2017 06/07/17   Jomarie Longs, MD  chlorthalidone (HYGROTON) 50 MG tablet Take 1 tablet (50 mg total) by mouth daily. 04/25/17   Particia Nearing, PA-C  diphenhydrAMINE (BENADRYL) 25 mg capsule Take 25 mg by mouth at bedtime as needed for allergies.    [provider]  loratadine (CLARITIN) 10 MG tablet Take 10 mg by mouth daily.    [provider]  naproxen sodium (ANAPROX) 220 MG tablet Take 440 mg by mouth 2 (two) times daily as needed (migraines).    [provider]    Physical Exam Vitals: Blood pressure 128/68, pulse 93, weight 228 lb (103.4 kg), not currently breastfeeding.  General: NAD HEENT: normocephalic, anicteric Pulmonary: No increased work of breathing Extremities: no edema, erythema, or tenderness Neurologic: Grossly intact, normal gait Psychiatric: mood appropriate, affect full   ENDOMETRIAL BIOPSY     The indications for endometrial biopsy were reviewed.   Risks of the biopsy including cramping, bleeding, infection, uterine perforation, inadequate specimen and need for additional procedures  were discussed. The patient states she understands and agrees to undergo procedure today. Consent was signed. Time out was performed. Urine HCG was negative. A Graves speculum was placed and the cervix was brought into view.  The cervix was prepped with Betadine. A single-toothed tenaculum was  placed on the anterior lip of the cervix for traction. A 3 mm pipelle was introduced through the cervix into the endometrial cavity without difficulty to a depth of  9cm, and a moderate amount of tissue was  obtained, the resulting specime sent to pathology. The instruments were removed from the patient's vagina. Minimal bleeding from the cervix was noted. The patient tolerated the procedure well. Routine post-procedure instructions were given to the patient.  She will be contacted by phone one results become available.     Assessment: 32 y.o. [id number] abnormal uterine bleeding  Plan: Problem List Items Addressed This Visit    None    Visit Diagnoses    Abnormal uterine bleeding    -  Primary   Relevant Orders   Surgical pathology (Completed)      1) AUB - IUD in proper  position.  Has Failed multiple means of hormonal contraception for cycle control.  We discussed ablation and long term failure rates.  Given long standing history of AUB patient opts for definitive surgical management via hysterectomy.  In anticipation of TLH obtained a endometrial biopsy  2) A total of 15 minutes were spent in face-to-face contact with the patient during this encounter with over half of that time devoted to counseling and coordination of care.  3) Return if symptoms worsen or fail to improve.   Vena Austria, MD, [facility], [facility]

## 2017-10-15 ENCOUNTER — Telehealth: Payer: Self-pay | Admitting: Obstetrics and Gynecology

## 2017-10-15 NOTE — Telephone Encounter (Signed)
Patient is aware of H&P at [facility] on Monday, 10/22/17 @ 9:10am w/ Dr Bonney Aid, Pre-admit Testing afterwards, and OR on 10/30/17. Patient is aware she may receive calls from the [facility] and [facility]. Patient confirmed BCBS and secondary Medicaid FPW. [employer] info discussed, and patient is aware Medicaid FPW will not pay for the surgery.  Patient intends to drop off FMLA paperwork today, and is aware of $25 fee. Ext given.

## 2017-10-16 ENCOUNTER — Telehealth: Payer: Self-pay | Admitting: Obstetrics and Gynecology

## 2017-10-16 NOTE — Telephone Encounter (Signed)
Tried calling patient to let her know that her FMLA paperwork has been filled out and ready for pick-up. Copy up front in folder.

## 2017-10-22 ENCOUNTER — Other Ambulatory Visit: Payer: Self-pay

## 2017-10-22 ENCOUNTER — Ambulatory Visit (INDEPENDENT_AMBULATORY_CARE_PROVIDER_SITE_OTHER): Payer: BLUE CROSS/BLUE SHIELD | Admitting: Obstetrics and Gynecology

## 2017-10-22 ENCOUNTER — Encounter: Payer: Self-pay | Admitting: Obstetrics and Gynecology

## 2017-10-22 ENCOUNTER — Encounter
Admission: RE | Admit: 2017-10-22 | Discharge: 2017-10-22 | Disposition: A | Discharge status: Home / Self Care | Payer: BLUE CROSS/BLUE SHIELD | Source: Ambulatory Visit | Attending: Obstetrics and Gynecology | Admitting: Obstetrics and Gynecology

## 2017-10-22 VITALS — BP 122/78 | HR 71 | Ht 62.0 in | Wt 231.0 lb

## 2017-10-22 DIAGNOSIS — N939 Abnormal uterine and vaginal bleeding, unspecified: Secondary | ICD-10-CM | POA: Diagnosis not present

## 2017-10-22 DIAGNOSIS — Z01818 Encounter for other preprocedural examination: Secondary | ICD-10-CM

## 2017-10-22 DIAGNOSIS — I10 Essential (primary) hypertension: Secondary | ICD-10-CM | POA: Insufficient documentation

## 2017-10-22 LAB — CBC
HEMATOCRIT: 35.4 % — AB (ref 36.0–46.0)
HEMOGLOBIN: 11.4 g/dL — AB (ref 12.0–15.0)
MCH: 26.6 pg (ref 26.0–34.0)
MCHC: 32.2 g/dL (ref 30.0–36.0)
MCV: 82.5 fL (ref 80.0–100.0)
NRBC: 0 % (ref 0.0–0.2)
PLATELETS: 166 10*3/uL (ref 150–400)
RBC: 4.29 MIL/uL (ref 3.87–5.11)
RDW: 15 % (ref 11.5–15.5)
WBC: 5.2 10*3/uL (ref 4.0–10.5)

## 2017-10-22 LAB — BASIC METABOLIC PANEL
ANION GAP: 8 (ref 5–15)
BUN: 8 mg/dL (ref 6–20)
CALCIUM: 8.5 mg/dL — AB (ref 8.9–10.3)
CHLORIDE: 103 mmol/L (ref 98–111)
CO2: 27 mmol/L (ref 22–32)
Creatinine, Ser: 0.84 mg/dL (ref 0.44–1.00)
GFR calc non Af Amer: >60 mL/min (ref >60)
GLUCOSE: 89 mg/dL (ref 70–99)
POTASSIUM: 3.8 mmol/L (ref 3.5–5.1)
Sodium: 138 mmol/L (ref 135–145)

## 2017-10-22 LAB — TYPE AND SCREEN
ABO/RH(D): A POS
Antibody Screen: NEGATIVE

## 2017-10-22 NOTE — Patient Instructions (Signed)
Your procedure is scheduled on: Tuesday 10/30/17.  Report to DAY SURGERY DEPARTMENT LOCATED ON 2ND FLOOR MEDICAL MALL ENTRANCE. To find out your arrival time please call [phone] between 1PM - 3PM on Monday 10/29/17.  Remember: Instructions that are not followed completely may result in serious medical risk, up to and including death, or upon the discretion of your surgeon and anesthesiologist your surgery may need to be rescheduled.     _X__ 1. Do not eat food after midnight the night before your procedure.                 No gum chewing or hard candies. You may drink clear liquids up to 2 hours                 before you are scheduled to arrive for your surgery- DO NOTdrink clear                 liquids within 2 hours of the start of your surgery.                 Clear Liquids include:  water, apple juice without pulp, clear carbohydrate                 drink such as Clearfast or Gatorade, Black Coffee or Tea   __X__2.  On the morning of surgery brush your teeth with toothpaste and water, you may rinse your mouth with mouthwash if you wish.  Do not swallow any toothpaste of mouthwash.     _X__ 3.  No Alcohol for 24 hours before or after surgery.   _X__ 4.  Do Not Smoke or use e-cigarettes For 24 Hours Prior to Your Surgery.                 Do not use any chewable tobacco products for at least 6 hours prior to                 surgery.  ____  5.  Bring all medications with you on the day of surgery if instructed.   __X__  6.  Notify your doctor if there is any change in your medical condition      (cold, fever, infections).     Do not wear jewelry, make-up, hairpins, clips or nail polish. Do not wear lotions, powders, or perfumes.  Do not shave 48 hours prior to surgery.  Do not bring valuables to the hospital.    [facility] is not responsible for any belongings or valuables.  Contacts, dentures/partials or body piercings may not be worn into surgery. Bring a case for your  contacts, glasses or hearing aids, a denture cup will be supplied. Leave your suitcase in the car. After surgery it may be brought to your room. For patients admitted to the hospital, discharge time is determined by your treatment team.   Patients discharged the day of surgery will not be allowed to drive home.   Please read over the following fact sheets that you were given:   MRSA Information  __X__ Take these medicines the morning of surgery with A SIP OF WATER:     1. esomeprazole (NEXIUM) 20 MG capsule  2. loratadine (CLARITIN) 10 MG tablet     __X__ Use CHG Soap/SAGE wipes as directed  __X__ Stop Anti-inflammatories 7 days before surgery such as Advil, Ibuprofen, Motrin, BC or Goodies Powder, Naprosyn, Naproxen (naproxen sodium (ANAPROX) 220 MG tablet), Aleve, Aspirin, Meloxicam. May take Tylenol if  needed for pain or discomfort.   __X__ Stop all herbal supplements.

## 2017-10-22 NOTE — Progress Notes (Signed)
Obstetrics & Gynecology Surgery H&P    Chief Complaint: Scheduled Surgery   History of Present Illness: Patient is a 32 y.o. [id number] presenting for scheduled TLH, BS, cystoscopy, for the treatment or further evaluation of abnormal uterine bleeding.   Prior Treatments prior to proceeding with surgery include: hormonal managements, Mirena IUD  Preoperative Pap: 08/02/2017 Results: NIL HPV negative  Preoperative Endometrial biopsy: 10/11/2017 Findings: inactive endometrium with progestin effect, negative for hyperplasia or malignancy Preoperative Ultrasound:10/11/2017 Findings: IUD in proper location but thickened endometrium is setting of IUD use at 14.36mm   Review of Systems:10 point review of systems  Past Medical History:  Past Medical History:  Diagnosis Date  . Anxiety   . BMI 45.0-49.9, adult (HCC)   . Depression   . Elevated blood pressure   . GERD (gastroesophageal reflux disease)   . History of bilateral tubal ligation 2007  . History of bilateral tubal ligation 2007  . History of reversal of tubal ligation   . Hypertension   . Low-lying placenta   . Lower extremity edema   . Tubal pregnancy Aug 2016    Past Surgical History:  Past Surgical History:  Procedure Laterality Date  . CHOLECYSTECTOMY N/A 09/02/2015   Procedure: LAPAROSCOPIC CHOLECYSTECTOMY;  Surgeon: Leafy Ro, MD;  Location: [facility] ORS;  Service: General;  Laterality: N/A;  . KNEE SURGERY    . LAPAROSCOPIC SALPINGO OOPHERECTOMY N/A 10/19/2016   Procedure: LAPAROSCOPIC SALPINGECTOMY bilateral;  Surgeon: Nadara Mustard, MD;  Location: [facility] ORS;  Service: Gynecology;  Laterality: N/A;  . TUBAL LIGATION    . Tubal reanastamosis      Family History:  Family History  Problem Relation Age of Onset  . Heart disease Mother   . Hyperlipidemia Mother   . Hypertension Mother   . Depression Mother   . Anxiety disorder Mother   . Diabetes Father   . Heart disease Father   . Hyperlipidemia Father   .  Hypertension Father   . Stroke Father   . Cancer Maternal Grandfather        lung  . Cancer Paternal Grandmother        stomach  . Epilepsy Daughter   . Asthma Son   . Stroke Paternal Grandfather   . Depression Maternal Aunt   . Anxiety disorder Maternal Aunt     Social History:  Social History   Socioeconomic History  . Marital status: Divorced    Spouse name: Not on file  . Number of children: 4  . Years of education: Not on file  . Highest education level: Some college, no degree  Occupational History  . Not on file  Social Needs  . Financial resource strain: Somewhat hard  . Food insecurity:    Worry: Often true    Inability: Often true  . Transportation needs:    Medical: No    Non-medical: No  Tobacco Use  . Smoking status: Current Every Day Smoker    Packs/day: 0.25    Years: 15.00    Pack years: 3.75    Types: Cigarettes    Last attempt to quit: 10/18/2015    Years since quitting: 2.0  . Smokeless tobacco: Never Used  Substance and Sexual Activity  . Alcohol use: Not Currently    Comment: occ.  . Drug use: No  . Sexual activity: Yes    Birth control/protection: None  Lifestyle  . Physical activity:    Days per week: 0 days    Minutes per  session: 0 min  . Stress: Very much  Relationships  . Social connections:    Talks on phone: More than three times a week    Gets together: Never    Attends religious service: More than 4 times per year    Active member of club or organization: No    Attends meetings of clubs or organizations: Never    Relationship status: Divorced  . Intimate partner violence:    Fear of current or ex partner: No    Emotionally abused: No    Physically abused: No    Forced sexual activity: No  Other Topics Concern  . Not on file  Social History Narrative  . Not on file    Allergies:  Allergies  Allergen Reactions  . Onion Anaphylaxis and Swelling    Raw onions causes throat to swell  . Latex Dermatitis     Medications: Prior to Admission medications   Medication Sig Start Date End Date Taking? Authorizing Provider  carbamazepine (TEGRETOL-XR) 100 MG 12 hr tablet Take 1 tablet (100 mg total) by mouth 2 (two) times daily. Patient not taking: Reported on 07/10/2017 06/07/17   Jomarie Longs, MD  chlorthalidone (HYGROTON) 50 MG tablet Take 1 tablet (50 mg total) by mouth daily. Patient not taking: Reported on 10/19/2017 04/25/17   Particia Nearing, PA-C  diphenhydrAMINE (BENADRYL) 25 mg capsule Take 25 mg by mouth at bedtime as needed for allergies.    [provider]  esomeprazole (NEXIUM) 20 MG capsule Take 20 mg by mouth daily as needed (acid reflux).    [provider]  loratadine (CLARITIN) 10 MG tablet Take 10 mg by mouth daily as needed for allergies.     [provider]  naproxen sodium (ANAPROX) 220 MG tablet Take 660 mg by mouth 2 (two) times daily as needed (migraines).     [provider]    Physical Exam Vitals: Blood pressure 122/78, pulse 71, height 5\' 2"  (1.575 m), weight 231 lb (104.8 kg), not currently breastfeeding.   General: NAD HEENT: normocephalic, anicteric Pulmonary: CTAB, No increased work of breathing Cardiovascular: RRR, distal pulses 2+ Abdomen: soft, non-tender, non-distended Genitourinary: deferred Extremities: no edema, erythema, or tenderness Neurologic: Grossly intact Psychiatric: mood appropriate, affect full  Imaging US Transvaginal Non-ob  Result Date: 10/11/2017 Patient Name: Holly Nicholson DOB: 03/04/1985 MRN: [id number] ULTRASOUND REPORT Location: [facility] Date of Service: 10/11/2017 Indications: Pelvic pain and bleeding Findings: The uterus is anteverted and measures 9.1 x 4.3 x 4.1cm. Echo texture is homogenous without evidence of focal masses. The Endometrium is heterogeneous and measures 14.3 mm. IUD appears in place. Right Ovary measures 3.0 x 2.2 x 2.2 cm. It is normal in appearance. Left Ovary  measures 3.2 x 2.3 x 2.1 cm. It is normal in appearance. Survey of the adnexa demonstrates no adnexal masses. There is no free fluid in the cul de sac. Impression: 1. Hetergeneous endometrium; IUD appears in place. Recommendations: 1.Clinical correlation with the patient's History and Physical Exam. Darlina Guys, RDMS RVT Images reviewed.  Normal GYN study without visualized pathology.  However, at 14.58mm the IUD appears thickened in the setting of progestin releasing IUD. Vena Austria, MD, [facility], [facility]    Assessment: 32 y.o. [phone] presenting for scheduled TLH, BS, cystoscopy  Plan: 1) Patient opts for definitive surgical management via hysterectomy. The risks of surgery were discussed in detail with the patient including but not limited to: bleeding which may require  transfusion or reoperation; infection which may require antibiotics; injury to bowel, bladder, ureters or other surrounding organs (With a literature reported rate of urinary tract injury of 1% quoted); need for additional procedures including laparotomy; thromboembolic phenomenon, incisional problems and other postoperative/anesthesia complications.  Patient was also advised that recovery procedure generally involves an overnight stay; and the  expected recovery time after a hysterectomy being in the range of 6-8 weeks.  Likelihood of success in alleviating the patient's symptoms was discussed.  While definitive in regards to issues with menstural bleeding, pelvic pain if present preoperatively may continue and in fact worsen postoperatively.  She is aware that the procedure will render her unable to pursue childbearing in the future.   She was told that she will be contacted by our surgical scheduler regarding the time and date of her surgery; routine preoperative instructions of having nothing to eat or drink after midnight on the day prior to surgery and also coming to the hospital 1.5 hours prior to  her time of surgery were also emphasized.  She was told she may be called for a preoperative appointment about a week prior to surgery and will be given further preoperative instructions at that visit.  Routine postoperative instructions will be reviewed with the patient and her family in detail after surgery. Printed patient education handouts about the procedure was given to the patient to review at home.   2) Routine postoperative instructions were reviewed with the patient and her family in detail today including the expected length of recovery and likely postoperative course.  The patient concurred with the proposed plan, giving informed written consent for the surgery today.  Patient instructed on the importance of being NPO after midnight prior to her procedure.  If warranted preoperative prophylactic antibiotics and SCDs ordered on call to the OR to meet SCIP guidelines and adhere to recommendation laid forth in ACOG Practice Bulletin Number 104 May 2009  "Antibiotic Prophylaxis for Gynecologic Procedures".     Vena Austria, MD, [facility], [facility] 10/22/2017, 9:13 AM

## 2017-10-23 ENCOUNTER — Encounter: Payer: BLUE CROSS/BLUE SHIELD | Admitting: Obstetrics and Gynecology

## 2017-10-29 MED ORDER — CEFAZOLIN SODIUM-DEXTROSE 2-4 GM/100ML-% IV SOLN
2.0000 g | INTRAVENOUS | Status: AC
  Administered 2017-10-30: 2 g via INTRAVENOUS

## 2017-10-30 ENCOUNTER — Observation Stay: Payer: BLUE CROSS/BLUE SHIELD | Admitting: Registered Nurse

## 2017-10-30 ENCOUNTER — Encounter: Payer: Self-pay | Admitting: Obstetrics and Gynecology

## 2017-10-30 ENCOUNTER — Encounter
Admission: RE | Disposition: A | Discharge status: Home / Self Care | Payer: Self-pay | Source: Ambulatory Visit | Attending: Obstetrics and Gynecology

## 2017-10-30 ENCOUNTER — Observation Stay
Admission: RE | Admit: 2017-10-30 | Discharge: 2017-10-31 | Disposition: A | Discharge status: Home / Self Care | Payer: BLUE CROSS/BLUE SHIELD | Source: Ambulatory Visit | Attending: Obstetrics and Gynecology | Admitting: Obstetrics and Gynecology

## 2017-10-30 ENCOUNTER — Other Ambulatory Visit: Payer: Self-pay

## 2017-10-30 DIAGNOSIS — Z6841 Body Mass Index (BMI) 40.0 and over, adult: Secondary | ICD-10-CM | POA: Insufficient documentation

## 2017-10-30 DIAGNOSIS — Z8249 Family history of ischemic heart disease and other diseases of the circulatory system: Secondary | ICD-10-CM | POA: Insufficient documentation

## 2017-10-30 DIAGNOSIS — K219 Gastro-esophageal reflux disease without esophagitis: Secondary | ICD-10-CM | POA: Diagnosis not present

## 2017-10-30 DIAGNOSIS — N711 Chronic inflammatory disease of uterus: Secondary | ICD-10-CM | POA: Diagnosis not present

## 2017-10-30 DIAGNOSIS — D500 Iron deficiency anemia secondary to blood loss (chronic): Secondary | ICD-10-CM

## 2017-10-30 DIAGNOSIS — D649 Anemia, unspecified: Secondary | ICD-10-CM | POA: Diagnosis not present

## 2017-10-30 DIAGNOSIS — F418 Other specified anxiety disorders: Secondary | ICD-10-CM | POA: Diagnosis not present

## 2017-10-30 DIAGNOSIS — I10 Essential (primary) hypertension: Secondary | ICD-10-CM | POA: Diagnosis not present

## 2017-10-30 DIAGNOSIS — F419 Anxiety disorder, unspecified: Secondary | ICD-10-CM | POA: Insufficient documentation

## 2017-10-30 DIAGNOSIS — Z79899 Other long term (current) drug therapy: Secondary | ICD-10-CM | POA: Insufficient documentation

## 2017-10-30 DIAGNOSIS — N920 Excessive and frequent menstruation with regular cycle: Principal | ICD-10-CM | POA: Insufficient documentation

## 2017-10-30 DIAGNOSIS — N72 Inflammatory disease of cervix uteri: Secondary | ICD-10-CM | POA: Diagnosis not present

## 2017-10-30 DIAGNOSIS — Z91040 Latex allergy status: Secondary | ICD-10-CM | POA: Insufficient documentation

## 2017-10-30 DIAGNOSIS — F17210 Nicotine dependence, cigarettes, uncomplicated: Secondary | ICD-10-CM | POA: Insufficient documentation

## 2017-10-30 DIAGNOSIS — N939 Abnormal uterine and vaginal bleeding, unspecified: Secondary | ICD-10-CM | POA: Diagnosis not present

## 2017-10-30 DIAGNOSIS — Z90710 Acquired absence of both cervix and uterus: Secondary | ICD-10-CM | POA: Diagnosis present

## 2017-10-30 DIAGNOSIS — F319 Bipolar disorder, unspecified: Secondary | ICD-10-CM | POA: Insufficient documentation

## 2017-10-30 HISTORY — PX: LAPAROSCOPIC HYSTERECTOMY: SHX1926

## 2017-10-30 LAB — POCT PREGNANCY, URINE: Preg Test, Ur: NEGATIVE

## 2017-10-30 SURGERY — HYSTERECTOMY, TOTAL, LAPAROSCOPIC
Anesthesia: General | Laterality: Bilateral

## 2017-10-30 MED ORDER — SUGAMMADEX SODIUM 500 MG/5ML IV SOLN
INTRAVENOUS | Status: DC | PRN
  Administered 2017-10-30: 220 mg via INTRAVENOUS

## 2017-10-30 MED ORDER — LACTATED RINGERS IV SOLN
INTRAVENOUS | Status: DC
  Administered 2017-10-30 (×2): via INTRAVENOUS

## 2017-10-30 MED ORDER — KETOROLAC TROMETHAMINE 30 MG/ML IJ SOLN
INTRAMUSCULAR | Status: DC | PRN
  Administered 2017-10-30: 30 mg via INTRAVENOUS

## 2017-10-30 MED ORDER — NICOTINE 7 MG/24HR TD PT24
7.0000 mg | MEDICATED_PATCH | Freq: Every day | TRANSDERMAL | Status: DC
  Administered 2017-10-30: 7 mg via TRANSDERMAL
  Filled 2017-10-30 (×3): qty 1

## 2017-10-30 MED ORDER — PROPOFOL 500 MG/50ML IV EMUL
INTRAVENOUS | Status: AC
  Filled 2017-10-30: qty 50

## 2017-10-30 MED ORDER — MIDAZOLAM HCL 2 MG/2ML IJ SOLN
INTRAMUSCULAR | Status: AC
  Filled 2017-10-30: qty 2

## 2017-10-30 MED ORDER — EPHEDRINE SULFATE 50 MG/ML IJ SOLN
INTRAMUSCULAR | Status: DC | PRN
  Administered 2017-10-30: 5 mg via INTRAVENOUS
  Administered 2017-10-30: 10 mg via INTRAVENOUS
  Administered 2017-10-30 (×4): 5 mg via INTRAVENOUS
  Administered 2017-10-30: 10 mg via INTRAVENOUS

## 2017-10-30 MED ORDER — ONDANSETRON HCL 4 MG/2ML IJ SOLN
INTRAMUSCULAR | Status: DC | PRN
  Administered 2017-10-30: 4 mg via INTRAVENOUS

## 2017-10-30 MED ORDER — ACETAMINOPHEN 10 MG/ML IV SOLN
INTRAVENOUS | Status: DC | PRN
  Administered 2017-10-30: 1000 mg via INTRAVENOUS

## 2017-10-30 MED ORDER — PROPOFOL 10 MG/ML IV BOLUS
INTRAVENOUS | Status: DC | PRN
  Administered 2017-10-30: 220 mg via INTRAVENOUS

## 2017-10-30 MED ORDER — SUCCINYLCHOLINE CHLORIDE 20 MG/ML IJ SOLN
INTRAMUSCULAR | Status: AC
  Filled 2017-10-30: qty 1

## 2017-10-30 MED ORDER — DEXAMETHASONE SODIUM PHOSPHATE 10 MG/ML IJ SOLN
INTRAMUSCULAR | Status: AC
  Filled 2017-10-30: qty 1

## 2017-10-30 MED ORDER — CEFAZOLIN SODIUM-DEXTROSE 2-4 GM/100ML-% IV SOLN
INTRAVENOUS | Status: AC
  Filled 2017-10-30: qty 100

## 2017-10-30 MED ORDER — BUPIVACAINE HCL (PF) 0.5 % IJ SOLN
INTRAMUSCULAR | Status: AC
  Filled 2017-10-30: qty 30

## 2017-10-30 MED ORDER — KETOROLAC TROMETHAMINE 30 MG/ML IJ SOLN
INTRAMUSCULAR | Status: AC
  Filled 2017-10-30: qty 1

## 2017-10-30 MED ORDER — ACETAMINOPHEN NICU IV SYRINGE 10 MG/ML
INTRAVENOUS | Status: AC
  Filled 2017-10-30: qty 1

## 2017-10-30 MED ORDER — FENTANYL CITRATE (PF) 250 MCG/5ML IJ SOLN
INTRAMUSCULAR | Status: AC
  Filled 2017-10-30: qty 5

## 2017-10-30 MED ORDER — PHENYLEPHRINE HCL 10 MG/ML IJ SOLN
INTRAMUSCULAR | Status: DC | PRN
  Administered 2017-10-30 (×2): 100 ug via INTRAVENOUS
  Administered 2017-10-30: 200 ug via INTRAVENOUS
  Administered 2017-10-30 (×4): 100 ug via INTRAVENOUS
  Administered 2017-10-30: 200 ug via INTRAVENOUS
  Administered 2017-10-30 (×3): 100 ug via INTRAVENOUS

## 2017-10-30 MED ORDER — ONDANSETRON HCL 4 MG/2ML IJ SOLN
4.0000 mg | Freq: Four times a day (QID) | INTRAMUSCULAR | Status: DC | PRN
  Administered 2017-10-30: 4 mg via INTRAVENOUS
  Filled 2017-10-30: qty 2

## 2017-10-30 MED ORDER — OXYCODONE-ACETAMINOPHEN 5-325 MG PO TABS
1.0000 | ORAL_TABLET | ORAL | Status: DC | PRN
  Administered 2017-10-30 – 2017-10-31 (×3): 2 via ORAL
  Filled 2017-10-30 (×3): qty 2

## 2017-10-30 MED ORDER — DEXTROSE-NACL 5-0.45 % IV SOLN
INTRAVENOUS | Status: DC
  Administered 2017-10-30 (×2): via INTRAVENOUS

## 2017-10-30 MED ORDER — ONDANSETRON HCL 4 MG/2ML IJ SOLN
INTRAMUSCULAR | Status: AC
  Filled 2017-10-30: qty 2

## 2017-10-30 MED ORDER — SUCCINYLCHOLINE CHLORIDE 20 MG/ML IJ SOLN
INTRAMUSCULAR | Status: DC | PRN
  Administered 2017-10-30: 100 mg via INTRAVENOUS

## 2017-10-30 MED ORDER — BUPIVACAINE HCL 0.5 % IJ SOLN
INTRAMUSCULAR | Status: DC | PRN
  Administered 2017-10-30: 7 mL

## 2017-10-30 MED ORDER — LIDOCAINE HCL (PF) 2 % IJ SOLN
INTRAMUSCULAR | Status: AC
  Filled 2017-10-30: qty 10

## 2017-10-30 MED ORDER — DEXAMETHASONE SODIUM PHOSPHATE 10 MG/ML IJ SOLN
INTRAMUSCULAR | Status: DC | PRN
  Administered 2017-10-30: 10 mg via INTRAVENOUS

## 2017-10-30 MED ORDER — DEXMEDETOMIDINE HCL 200 MCG/2ML IV SOLN
INTRAVENOUS | Status: DC | PRN
  Administered 2017-10-30: 8 ug via INTRAVENOUS
  Administered 2017-10-30: 12 ug via INTRAVENOUS

## 2017-10-30 MED ORDER — ONDANSETRON HCL 4 MG/2ML IJ SOLN
INTRAMUSCULAR | Status: AC
  Administered 2017-10-30: 4 mg via INTRAVENOUS
  Filled 2017-10-30: qty 2

## 2017-10-30 MED ORDER — ROCURONIUM BROMIDE 50 MG/5ML IV SOLN
INTRAVENOUS | Status: AC
  Filled 2017-10-30: qty 1

## 2017-10-30 MED ORDER — ROCURONIUM BROMIDE 100 MG/10ML IV SOLN
INTRAVENOUS | Status: DC | PRN
  Administered 2017-10-30: 30 mg via INTRAVENOUS
  Administered 2017-10-30 (×2): 10 mg via INTRAVENOUS
  Administered 2017-10-30: 5 mg via INTRAVENOUS
  Administered 2017-10-30: 15 mg via INTRAVENOUS

## 2017-10-30 MED ORDER — MORPHINE SULFATE (PF) 2 MG/ML IV SOLN
1.0000 mg | INTRAVENOUS | Status: DC | PRN
  Administered 2017-10-30: 1 mg via INTRAVENOUS
  Administered 2017-10-30: 2 mg via INTRAVENOUS
  Filled 2017-10-30 (×2): qty 1

## 2017-10-30 MED ORDER — KETOROLAC TROMETHAMINE 30 MG/ML IJ SOLN
30.0000 mg | Freq: Four times a day (QID) | INTRAMUSCULAR | Status: DC
  Administered 2017-10-30 – 2017-10-31 (×3): 30 mg via INTRAVENOUS
  Filled 2017-10-30 (×3): qty 1

## 2017-10-30 MED ORDER — LIDOCAINE HCL (CARDIAC) PF 100 MG/5ML IV SOSY
PREFILLED_SYRINGE | INTRAVENOUS | Status: DC | PRN
  Administered 2017-10-30: 100 mg via INTRAVENOUS

## 2017-10-30 MED ORDER — FENTANYL CITRATE (PF) 100 MCG/2ML IJ SOLN
INTRAMUSCULAR | Status: DC | PRN
  Administered 2017-10-30: 50 ug via INTRAVENOUS
  Administered 2017-10-30: 100 ug via INTRAVENOUS

## 2017-10-30 MED ORDER — MENTHOL 3 MG MT LOZG
1.0000 | LOZENGE | OROMUCOSAL | Status: DC | PRN
  Filled 2017-10-30: qty 9

## 2017-10-30 MED ORDER — ONDANSETRON HCL 4 MG/2ML IJ SOLN
4.0000 mg | Freq: Once | INTRAMUSCULAR | Status: AC | PRN
  Administered 2017-10-30: 4 mg via INTRAVENOUS

## 2017-10-30 MED ORDER — MIDAZOLAM HCL 2 MG/2ML IJ SOLN
INTRAMUSCULAR | Status: DC | PRN
  Administered 2017-10-30: 2 mg via INTRAVENOUS

## 2017-10-30 MED ORDER — SUGAMMADEX SODIUM 500 MG/5ML IV SOLN
INTRAVENOUS | Status: AC
  Filled 2017-10-30: qty 5

## 2017-10-30 MED ORDER — FENTANYL CITRATE (PF) 100 MCG/2ML IJ SOLN: 25.0000 ug | INTRAMUSCULAR | Status: DC | PRN

## 2017-10-30 MED ORDER — PHENYLEPHRINE HCL 10 MG/ML IJ SOLN
INTRAMUSCULAR | Status: AC
  Filled 2017-10-30: qty 1

## 2017-10-30 MED ORDER — KETOROLAC TROMETHAMINE 30 MG/ML IJ SOLN: 30.0000 mg | Freq: Four times a day (QID) | INTRAMUSCULAR | Status: DC

## 2017-10-30 MED ORDER — DEXMEDETOMIDINE HCL IN NACL 80 MCG/20ML IV SOLN
INTRAVENOUS | Status: AC
  Filled 2017-10-30: qty 20

## 2017-10-30 SURGICAL SUPPLY — 58 items
BAG URINE DRAINAGE (UROLOGICAL SUPPLIES) ×2 IMPLANT
BLADE SURG SZ11 CARB STEEL (BLADE) ×2 IMPLANT
CANISTER SUCT 1200ML W/VALVE (MISCELLANEOUS) ×2 IMPLANT
CATH FOLEY 2WAY  5CC 16FR (CATHETERS) ×1
CATH URTH 16FR FL 2W BLN LF (CATHETERS) ×1 IMPLANT
CHLORAPREP W/TINT 26ML (MISCELLANEOUS) ×2 IMPLANT
COUNTER NEEDLE 20/40 LG (NEEDLE) ×2 IMPLANT
COVER LIGHT HANDLE STERIS (MISCELLANEOUS) ×2 IMPLANT
COVER WAND RF STERILE (DRAPES) ×2 IMPLANT
DEFOGGER SCOPE WARMER CLEARIFY (MISCELLANEOUS) ×2 IMPLANT
DERMABOND ADVANCED (GAUZE/BANDAGES/DRESSINGS) ×1
DERMABOND ADVANCED .7 DNX12 (GAUZE/BANDAGES/DRESSINGS) ×1 IMPLANT
DEVICE SUTURE ENDOST 10MM (ENDOMECHANICALS) ×2 IMPLANT
GLOVE BIO SURGEON STRL SZ7 (GLOVE) ×8 IMPLANT
GLOVE INDICATOR 7.5 STRL GRN (GLOVE) ×2 IMPLANT
GOWN STRL REUS W/ TWL LRG LVL3 (GOWN DISPOSABLE) ×3 IMPLANT
GOWN STRL REUS W/ TWL XL LVL3 (GOWN DISPOSABLE) ×1 IMPLANT
GOWN STRL REUS W/TWL LRG LVL3 (GOWN DISPOSABLE) ×3
GOWN STRL REUS W/TWL XL LVL3 (GOWN DISPOSABLE) ×1
IRRIGATION STRYKERFLOW (MISCELLANEOUS) ×1 IMPLANT
IRRIGATOR STRYKERFLOW (MISCELLANEOUS) ×2
IV LACTATED RINGERS 1000ML (IV SOLUTION) ×2 IMPLANT
IV NS 1000ML (IV SOLUTION) ×1
IV NS 1000ML BAXH (IV SOLUTION) ×1 IMPLANT
KIT PINK PAD W/HEAD ARE REST (MISCELLANEOUS) ×2
KIT PINK PAD W/HEAD ARM REST (MISCELLANEOUS) ×1 IMPLANT
LABEL OR SOLS (LABEL) ×2 IMPLANT
MANIPULATOR VCARE LG CRV RETR (MISCELLANEOUS) ×2 IMPLANT
MANIPULATOR VCARE SML CRV RETR (MISCELLANEOUS) IMPLANT
MANIPULATOR VCARE STD CRV RETR (MISCELLANEOUS) IMPLANT
NS IRRIG 500ML POUR BTL (IV SOLUTION) ×2 IMPLANT
OCCLUDER COLPOPNEUMO (BALLOONS) IMPLANT
PACK GYN LAPAROSCOPIC (MISCELLANEOUS) ×2 IMPLANT
PAD OB MATERNITY 4.3X12.25 (PERSONAL CARE ITEMS) ×2 IMPLANT
PAD PREP 24X41 OB/GYN DISP (PERSONAL CARE ITEMS) ×2 IMPLANT
PORT ACCESS TROCAR AIRSEAL 5 (TROCAR) ×2 IMPLANT
SCISSORS METZENBAUM CVD 33 (INSTRUMENTS) ×2 IMPLANT
SET CYSTO W/LG BORE CLAMP LF (SET/KITS/TRAYS/PACK) ×2 IMPLANT
SET TRI-LUMEN FLTR TB AIRSEAL (TUBING) ×2 IMPLANT
SHEARS HARMONIC ACE PLUS 36CM (ENDOMECHANICALS) ×2 IMPLANT
SLEEVE ENDOPATH XCEL 5M (ENDOMECHANICALS) ×4 IMPLANT
SPONGE LAP 18X18 RF (DISPOSABLE) IMPLANT
STRAP SAFETY 5IN WIDE (MISCELLANEOUS) ×2 IMPLANT
SUT ENDO VLOC 180-0-8IN (SUTURE) ×2 IMPLANT
SUT MNCRL 4-0 (SUTURE) ×2
SUT MNCRL 4-0 27XMFL (SUTURE) ×2
SUT VIC AB 0 CT1 27 (SUTURE) ×1
SUT VIC AB 0 CT1 27XCR 8 STRN (SUTURE) ×1 IMPLANT
SUT VIC AB 0 CT1 36 (SUTURE) ×2 IMPLANT
SUT VIC AB 2-0 UR6 27 (SUTURE) ×2 IMPLANT
SUT VICRYL 0 AB UR-6 (SUTURE) ×2 IMPLANT
SUTURE MNCRL 4-0 27XMF (SUTURE) ×2 IMPLANT
SYR 10ML LL (SYRINGE) ×2 IMPLANT
SYR 50ML LL SCALE MARK (SYRINGE) ×2 IMPLANT
SYSTEM WECK SHIELD CLOSURE (TROCAR) ×2 IMPLANT
TROCAR ENDO BLADELESS 11MM (ENDOMECHANICALS) ×2 IMPLANT
TROCAR XCEL NON-BLD 5MMX100MML (ENDOMECHANICALS) ×2 IMPLANT
TUBING INSUF HEATED (TUBING) ×2 IMPLANT

## 2017-10-30 NOTE — Anesthesia Preprocedure Evaluation (Signed)
Anesthesia Evaluation  Patient identified by MRN, date of birth, ID band Patient awake    Reviewed: Allergy & Precautions, NPO status , Patient's Chart, lab work & pertinent test results  History of Anesthesia Complications Negative for: history of anesthetic complications  Airway Mallampati: II       Dental  (+) Upper Dentures, Lower Dentures   Pulmonary neg sleep apnea, neg COPD, Current Smoker,           Cardiovascular hypertension (occassional, no meds), (-) Past MI and (-) CHF (-) dysrhythmias (-) Valvular Problems/Murmurs     Neuro/Psych neg Seizures Anxiety Depression Bipolar Disorder    GI/Hepatic Neg liver ROS, GERD  Medicated,  Endo/Other  neg diabetesMorbid obesity  Renal/GU negative Renal ROS     Musculoskeletal   Abdominal   Peds  Hematology   Anesthesia Other Findings   Reproductive/Obstetrics                             Anesthesia Physical Anesthesia Plan  ASA: III  Anesthesia Plan: General   Post-op Pain Management:    Induction: Intravenous  PONV Risk Score and Plan: 2 and Dexamethasone and Ondansetron  Airway Management Planned: Oral ETT  Additional Equipment:   Intra-op Plan:   Post-operative Plan:   Informed Consent: I have reviewed the patients History and Physical, chart, labs and discussed the procedure including the risks, benefits and alternatives for the proposed anesthesia with the patient or authorized representative who has indicated his/her understanding and acceptance.     Plan Discussed with:   Anesthesia Plan Comments:         Anesthesia Quick Evaluation

## 2017-10-30 NOTE — Progress Notes (Signed)
Patient states she is a 10/10 pain after percocet and morphine. Yet patient had fallen asleep after given percocet and was laughing and joking with visitor after morphine. Patient states her pain is not "as bad" as it was earlier but its still a 10. Patient educated on pain medications given and pain scale earlier in the shift, as well as goals for her plan of care throughout the night. Patient verbalized understanding.

## 2017-10-30 NOTE — Op Note (Signed)
Preoperative Diagnosis: 1) 32 y.o. with menorrhagia to anemia  Postoperative Diagnosis: 1) 32 y.o. with menorrhagia to anemia  Operation Performed: Total laparoscopic hysterectomy, and cystoscopy  Indication: Menorrhagia to anemia failing medical management  Surgeon: Vena Austria, MD  Assistant: Adelene Idler, MD This surgery required a high level surgical assistant with none other readily available  Anesthesia: General  Preoperative Antibiotics: none  Estimated Blood Loss: 150 mL  IV Fluids:  Drains or Tubes: none  Implants: none  Specimens Removed: Uterus, cervix  Complications: none  Intraoperative Findings: Normal uterus, normal ovaries, bilateral absent fallopian tube.  Mirena IUD in place removed at start of case.  Intact bladder dome with bilateral efflux of urine from bother ureteral orifices.    Patient Condition: stable  Procedure in Detail:  Patient was taken to the operating room where she was administered general anesthesia.  She was positioned in the dorsal lithotomy position utilizing Allen stirups, prepped and draped in the usual sterile fashion.  Prior to proceeding with procedure a time out was performed.  Attention was turned to the patient's pelvis.  An indwelling foley catheter was placed to decompress the patient's bladder.  An operative speculum was placed to allow visualization of the cervix.  The anterior lip of the cervix was grasped with a single tooth tenaculum, and a large V-care uterine manipulator was placed to allow manipulation of the uterus.  The operative speculum and single tooth tenaculum were then removed.  Attention was turned to the patient's abdomen.  The umbilicus was infiltrated with 1% Sensorcaine, before making a stab incision using an 11 blade scalpel.  A 5mm Excel trocar was then used to gain direct entry into the peritoneal cavity utilizing the camera to visualize progress of the trocar during placement.  Once  peritoneal entry had been achieved, insufflation was started and pneumoperitoneum established at a pressure of .    One left and one right lower quadrant site were then injected with 1% Sensorcaine and a stab incision was made using an 11 blade scalpel.  Two additional 5mm Excel trocars were placed through these incisions under direct visualization. General inspection of the abdomen revealed the above noted findings.   The right tube was identified and grasped at its fimbriated end.  The tube was transected from its attachments to the ovary and mesosalpinx using a 5mm Harmonic scalpel.  The utero ovarian ligament was identified ligated and transected using the Harmonic scalpel. The round ligament was then likewise ligated and transected.  The anterior leaf of the broad ligament was dissected down to the level of the internal cervical os and a bladder flap was started.  The posterior leaf of the broad ligament was dissected down to the utero-sacral ligament.  The uterine artery was skeletonized before being ligated and transected using the Harmonic scalpel with cephelad pressure applied to the V-care device to assure lateralization of the ureter.  A bite was then taken with Harmonic medial to transected portio of uterine artery to further lateralize the ureter and vessel off the V-care cup.  The patient left adnexal structures were then dissected in similar fashion.  The bladder flap was completed and the bladder mobilized off the V-care cup.  Some bleeding was encountered from the left uterine artery during colpotomy with hemostasis achieved using a bipolar.  An anterior colpotomy was scores and carried around in a clockwise fashion to free the specimen, which was then removed vaginally.  Inspection revealed all pedicles to be hemostatic before proceed  with vaginal closure.  The left 5mm port was stepped up to a 11mm Excel trocar.  The cuff was closed using an endostich device using continuous  Stitches and a  V-loc load.  The cuff was hemsotatic at the conclusion of closure without visible or palpable defects.  The indwelling foley catheter was removed.  Cystoscopy was performed noting and intact bladder dome as well as brisk efflux of urine from bother ureteral orifices.  The cystoscopy was removed and the indwelling foley catheter was replaced.  Pneumoperitoneum was re-established, the pelvis was irrigated and all pedicles were once again inspected and noted to be hemostatic.  The cuff appeared well closed and free of bowl or other tissue that may have inadvertently been incorporated into the vaginal closure.  Additional hemostatic agents were not applied to all pedicles.      Pneumoperitoneum was evacuated and trocars were removed.  All trocar sites were then dressed with surgical skin glue.  Sponge needle and instrument counts were correct time two.  The patient tolerated the procedure well and was taken to the recovery room in stable condition.

## 2017-10-30 NOTE — Transfer of Care (Signed)
Immediate Anesthesia Transfer of Care Note  Patient: Holly Nicholson  Procedure(s) Performed: HYSTERECTOMY TOTAL LAPAROSCOPIC-BILATERAL SALPINGECTOMY (Bilateral )  Patient Location: PACU  Anesthesia Type:General  Level of Consciousness: awake and sedated  Airway & Oxygen Therapy: Patient Spontanous Breathing and Patient connected to face mask oxygen  Post-op Assessment: Report given to RN and Post -op Vital signs reviewed and stable  Post vital signs: stable  Last Vitals:  Vitals Value Taken Time  BP 93/60 10/30/2017  1:18 PM  Temp 36.6 C 10/30/2017  1:18 PM  Pulse 94 10/30/2017  1:20 PM  Resp 9 10/30/2017  1:20 PM  SpO2 100 % 10/30/2017  1:20 PM  Vitals shown include unvalidated device data.  Last Pain:  Vitals:   10/30/17 1318  TempSrc:   PainSc: Asleep         Complications: No apparent anesthesia complications

## 2017-10-30 NOTE — Anesthesia Procedure Notes (Signed)
Procedure Name: Intubation Performed by: Lia Foyer, RN Pre-anesthesia Checklist: Patient identified, Patient being monitored, Timeout performed, Emergency Drugs available and Suction available Patient Re-evaluated:Patient Re-evaluated prior to induction Oxygen Delivery Method: Circle system utilized Preoxygenation: Pre-oxygenation with 100% oxygen Induction Type: IV induction Ventilation: Mask ventilation without difficulty Laryngoscope Size: Mac and 3 Grade View: Grade I Tube type: Oral Tube size: 7.0 mm Number of attempts: 1 Airway Equipment and Method: Stylet Placement Confirmation: ETT inserted through vocal cords under direct vision,  positive ETCO2 and breath sounds checked- equal and bilateral Secured at: 22 cm Tube secured with: Tape Dental Injury: Teeth and Oropharynx as per pre-operative assessment  Future Recommendations: Recommend- induction with short-acting agent, and alternative techniques readily available

## 2017-10-30 NOTE — Anesthesia Post-op Follow-up Note (Signed)
Anesthesia QCDR form completed.        

## 2017-10-30 NOTE — Anesthesia Postprocedure Evaluation (Signed)
Anesthesia Post Note  Patient: Holly Nicholson  Procedure(s) Performed: HYSTERECTOMY TOTAL LAPAROSCOPIC-BILATERAL SALPINGECTOMY (Bilateral )  Patient location during evaluation: PACU Anesthesia Type: General Level of consciousness: awake and alert Pain management: pain level controlled Vital Signs Assessment: post-procedure vital signs reviewed and stable Respiratory status: spontaneous breathing and respiratory function stable Cardiovascular status: stable Anesthetic complications: no     Last Vitals:  Vitals:   10/30/17 0900 10/30/17 1318  BP: 119/75 93/60  Pulse: 70 93  Resp: 20 19  Temp: (!) 35.8 C 36.6 C  SpO2: 100% 100%    Last Pain:  Vitals:   10/30/17 1318  TempSrc:   PainSc: Asleep                 Takashi Korol K

## 2017-10-30 NOTE — H&P (Signed)
Date of Initial H&P: 10/22/2017  History reviewed, patient examined, no change in status, stable for surgery.

## 2017-10-31 DIAGNOSIS — D649 Anemia, unspecified: Secondary | ICD-10-CM | POA: Diagnosis not present

## 2017-10-31 DIAGNOSIS — F319 Bipolar disorder, unspecified: Secondary | ICD-10-CM | POA: Diagnosis not present

## 2017-10-31 DIAGNOSIS — F419 Anxiety disorder, unspecified: Secondary | ICD-10-CM | POA: Diagnosis not present

## 2017-10-31 DIAGNOSIS — N711 Chronic inflammatory disease of uterus: Secondary | ICD-10-CM | POA: Diagnosis not present

## 2017-10-31 DIAGNOSIS — N72 Inflammatory disease of cervix uteri: Secondary | ICD-10-CM | POA: Diagnosis not present

## 2017-10-31 DIAGNOSIS — N920 Excessive and frequent menstruation with regular cycle: Secondary | ICD-10-CM | POA: Diagnosis not present

## 2017-10-31 DIAGNOSIS — Z8249 Family history of ischemic heart disease and other diseases of the circulatory system: Secondary | ICD-10-CM | POA: Diagnosis not present

## 2017-10-31 DIAGNOSIS — Z91040 Latex allergy status: Secondary | ICD-10-CM | POA: Diagnosis not present

## 2017-10-31 DIAGNOSIS — Z79899 Other long term (current) drug therapy: Secondary | ICD-10-CM | POA: Diagnosis not present

## 2017-10-31 DIAGNOSIS — K219 Gastro-esophageal reflux disease without esophagitis: Secondary | ICD-10-CM | POA: Diagnosis not present

## 2017-10-31 DIAGNOSIS — I10 Essential (primary) hypertension: Secondary | ICD-10-CM | POA: Diagnosis not present

## 2017-10-31 DIAGNOSIS — Z6841 Body Mass Index (BMI) 40.0 and over, adult: Secondary | ICD-10-CM | POA: Diagnosis not present

## 2017-10-31 DIAGNOSIS — F17210 Nicotine dependence, cigarettes, uncomplicated: Secondary | ICD-10-CM | POA: Diagnosis not present

## 2017-10-31 LAB — CBC
HEMATOCRIT: 32.6 % — AB (ref 36.0–46.0)
HEMOGLOBIN: 10.5 g/dL — AB (ref 12.0–15.0)
MCH: 26.7 pg (ref 26.0–34.0)
MCHC: 32.2 g/dL (ref 30.0–36.0)
MCV: 83 fL (ref 80.0–100.0)
NRBC: 0 % (ref 0.0–0.2)
Platelets: 159 10*3/uL (ref 150–400)
RBC: 3.93 MIL/uL (ref 3.87–5.11)
RDW: 14.9 % (ref 11.5–15.5)
WBC: 10.3 10*3/uL (ref 4.0–10.5)

## 2017-10-31 LAB — BASIC METABOLIC PANEL
Anion gap: 5 (ref 5–15)
BUN: 6 mg/dL (ref 6–20)
CHLORIDE: 109 mmol/L (ref 98–111)
CO2: 28 mmol/L (ref 22–32)
Calcium: 8.4 mg/dL — ABNORMAL LOW (ref 8.9–10.3)
Creatinine, Ser: 0.72 mg/dL (ref 0.44–1.00)
Glucose, Bld: 156 mg/dL — ABNORMAL HIGH (ref 70–99)
Potassium: 4.1 mmol/L (ref 3.5–5.1)
Sodium: 142 mmol/L (ref 135–145)

## 2017-10-31 MED ORDER — LIDOCAINE-PRILOCAINE 2.5-2.5 % EX CREA: 1.0000 "application " | TOPICAL_CREAM | CUTANEOUS | 0 refills | Status: DC | PRN

## 2017-10-31 MED ORDER — OXYCODONE-ACETAMINOPHEN 5-325 MG PO TABS: 1.0000 | ORAL_TABLET | ORAL | 0 refills | Status: DC | PRN

## 2017-10-31 MED ORDER — LIDOCAINE-PRILOCAINE 2.5-2.5 % EX CREA
TOPICAL_CREAM | Freq: Once | CUTANEOUS | Status: DC
  Filled 2017-10-31: qty 5

## 2017-10-31 MED ORDER — IBUPROFEN 600 MG PO TABS: 600.0000 mg | ORAL_TABLET | Freq: Four times a day (QID) | ORAL | 3 refills | Status: DC | PRN

## 2017-10-31 MED ORDER — DOCUSATE SODIUM 100 MG PO CAPS
100.0000 mg | ORAL_CAPSULE | Freq: Two times a day (BID) | ORAL | Status: DC | PRN
  Administered 2017-10-31: 100 mg via ORAL
  Filled 2017-10-31: qty 1

## 2017-10-31 MED ORDER — SIMETHICONE 80 MG PO CHEW
80.0000 mg | CHEWABLE_TABLET | Freq: Four times a day (QID) | ORAL | Status: DC | PRN
  Administered 2017-10-31: 80 mg via ORAL
  Filled 2017-10-31 (×2): qty 1

## 2017-10-31 MED ORDER — IBUPROFEN 600 MG PO TABS
600.0000 mg | ORAL_TABLET | Freq: Four times a day (QID) | ORAL | Status: DC | PRN
  Administered 2017-10-31: 600 mg via ORAL
  Filled 2017-10-31: qty 1

## 2017-10-31 NOTE — Progress Notes (Signed)
patient discharged home. Pain under control before discharge, see flowsheet

## 2017-10-31 NOTE — Progress Notes (Signed)
Discharge instructions, prescriptions and follow up appointment given to and reviewed with patient. Patient verbalized understanding.   Patient requested to stay another night due to "pain control". Spoke with patient about the pain she was experiencing and describing (mosty likely gas pain) and ways to manage it. Encouraged patient to walk around the unit and for [place] to stay on top of pain medication (percocet, and motrin). She agreed with the plan and said she would be okay with leaving later today. MD notified.

## 2017-10-31 NOTE — Discharge Summary (Signed)
Physician Discharge Summary  Patient ID: Holly Nicholson MRN: [id number] DOB/AGE: December 29, 1985 32 y.o.  Admit date: 10/30/2017 Discharge date: 10/31/2017  Admission Diagnoses: Scheduled hysterectomy  Discharge Diagnoses:  Active Problems:   S/P laparoscopic hysterectomy   Discharged Condition: good  Hospital Course: Patient admitted for and underwent uncomplicated TLH, and cystoscopy.  Stable overnight with stable vitals and postoperative labs.  Meeting all postoperative goals at time of discharge  Consults: None  Significant Diagnostic Studies:  Results for orders placed or performed during the hospital encounter of 10/30/17 (from the past 24 hour(s))  Pregnancy, urine POC     Status: None   Collection Time: 10/30/17  9:07 AM  Result Value Ref Range   Preg Test, Ur NEGATIVE NEGATIVE  CBC     Status: Abnormal   Collection Time: 10/31/17  5:10 AM  Result Value Ref Range   WBC 10.3 4.0 - 10.5 K/uL   RBC 3.93 3.87 - 5.11 MIL/uL   Hemoglobin 10.5 (L) 12.0 - 15.0 g/dL   HCT 40.9 (L) 81.1 - 91.4 %   MCV 83.0 80.0 - 100.0 fL   MCH 26.7 26.0 - 34.0 pg   MCHC 32.2 30.0 - 36.0 g/dL   RDW 78.2 95.6 - 21.3 %   Platelets 159 150 - 400 K/uL   nRBC 0.0 0.0 - 0.2 %  Basic metabolic panel     Status: Abnormal   Collection Time: 10/31/17  5:10 AM  Result Value Ref Range   Sodium 142 135 - 145 mmol/L   Potassium 4.1 3.5 - 5.1 mmol/L   Chloride 109 98 - 111 mmol/L   CO2 28 22 - 32 mmol/L   Glucose, Bld 156 (H) 70 - 99 mg/dL   BUN 6 6 - 20 mg/dL   Creatinine, Ser 0.86 0.44 - 1.00 mg/dL   Calcium 8.4 (L) 8.9 - 10.3 mg/dL   GFR calc non Af Amer >60 >60 mL/min   GFR calc Af Amer >60 >60 mL/min   Anion gap 5 5 - 15     Treatments: TLH and cystoscopy  Discharge Exam: Blood pressure 113/81, pulse 86, temperature 98.3 F (36.8 C), temperature source Oral, resp. rate 18, SpO2 100 %, not currently breastfeeding. General appearance: alert, appears stated age and no distress Resp:  normal respiratory effort GI: NABS, soft, non-distended, some tenderness and tethering from fascial stitch at RLQ 11mm port site Extremities: extremities normal, atraumatic, no cyanosis or edema  Disposition: Discharge disposition: 01-Home or Self Care       Discharge Instructions    Call MD for:   Complete by:  As directed    Heavy vaginal bleeding greater than 1 pad an hour   Call MD for:  difficulty breathing, headache or visual disturbances   Complete by:  As directed    Call MD for:  extreme fatigue   Complete by:  As directed    Call MD for:  hives   Complete by:  As directed    Call MD for:  persistant dizziness or light-headedness   Complete by:  As directed    Call MD for:  persistant nausea and vomiting   Complete by:  As directed    Call MD for:  redness, tenderness, or signs of infection (pain, swelling, redness, odor or green/yellow discharge around incision site)   Complete by:  As directed    Call MD for:  severe uncontrolled pain   Complete by:  As directed    Call MD for:  temperature >100.4   Complete by:  As directed    Diet general   Complete by:  As directed    Discharge wound care:   Complete by:  As directed    You may apply a light dressing for minor discharge from the incision or to keep waistbands of clothing from rubbing.  You may also have been discharge with a clear dressing in which case this will be removed at your postoperative clinic visit.  You may shower, use soap on your incision.  Avoid baths or soaking the incision in the first 6 weeks following your surgery..   Driving restriction   Complete by:  As directed    Avoid driving for at least 2 weeks or while taking prescription narcotics.   Lifting restrictions   Complete by:  As directed    Weight restriction of 10lbs for 6 weeks.   Sexual acrtivity   Complete by:  As directed    No intercourse, tampons, or anything vaginally for 6 weeks     Allergies as of 10/31/2017      Reactions    Onion Anaphylaxis, Swelling   Raw onions causes throat to swell   Latex Dermatitis      Medication List    STOP taking these medications   acetaminophen 500 MG tablet Commonly known as:  TYLENOL   naproxen sodium 220 MG tablet Commonly known as:  ALEVE     TAKE these medications   carbamazepine 100 MG 12 hr tablet Commonly known as:  TEGRETOL XR Take 1 tablet (100 mg total) by mouth 2 (two) times daily.   chlorthalidone 50 MG tablet Commonly known as:  HYGROTON Take 1 tablet (50 mg total) by mouth daily.   diphenhydrAMINE 25 mg capsule Commonly known as:  BENADRYL Take 25 mg by mouth at bedtime as needed for allergies.   esomeprazole 20 MG capsule Commonly known as:  NEXIUM Take 20 mg by mouth daily as needed (acid reflux).   ibuprofen 600 MG tablet Commonly known as:  ADVIL,MOTRIN Take 1 tablet (600 mg total) by mouth every 6 (six) hours as needed.   lidocaine-prilocaine cream Commonly known as:  EMLA Apply 1 application topically as needed.   loratadine 10 MG tablet Commonly known as:  CLARITIN Take 10 mg by mouth daily as needed for allergies.   oxyCODONE-acetaminophen 5-325 MG tablet Commonly known as:  PERCOCET/ROXICET Take 1 tablet by mouth every 4 (four) hours as needed for moderate pain or severe pain.            Discharge Care Instructions  (From admission, onward)         Start     Ordered   10/31/17 0000  Discharge wound care:    Comments:  You may apply a light dressing for minor discharge from the incision or to keep waistbands of clothing from rubbing.  You may also have been discharge with a clear dressing in which case this will be removed at your postoperative clinic visit.  You may shower, use soap on your incision.  Avoid baths or soaking the incision in the first 6 weeks following your surgery..   10/31/17 1610         Follow-up Information    Vena Austria, MD In 1 week.   Specialty:  Obstetrics and Gynecology Why:  For  wound re-check Contact information: [address] [place] [address] [phone]           Signed: Vena Austria 10/31/2017, 8:31  AM

## 2017-11-01 ENCOUNTER — Other Ambulatory Visit: Payer: Self-pay | Admitting: Obstetrics and Gynecology

## 2017-11-01 ENCOUNTER — Telehealth: Payer: Self-pay

## 2017-11-01 LAB — SURGICAL PATHOLOGY

## 2017-11-01 MED ORDER — ONDANSETRON 4 MG PO TBDP: 4.0000 mg | ORAL_TABLET | Freq: Four times a day (QID) | ORAL | 0 refills | Status: DC | PRN

## 2017-11-01 NOTE — Telephone Encounter (Signed)
Holly Nicholson w/[employer] calling to confirm that her surgery took place, that it was a hysterectomy & how long she is being advised out of work. Cb#[phone] ext [phone]. You may leave a message on her confidential vm.

## 2017-11-02 NOTE — Telephone Encounter (Signed)
6-8 weeks, she will have initial exam of the cuff at 6 weeks and if not completely healed 8 weeks

## 2017-11-02 NOTE — Telephone Encounter (Signed)
How long have you advised her to be out of work?Thank you!

## 2017-11-05 NOTE — Telephone Encounter (Signed)
I have called Holly Nicholson back and left a detailed VM for her.

## 2017-11-07 ENCOUNTER — Ambulatory Visit (INDEPENDENT_AMBULATORY_CARE_PROVIDER_SITE_OTHER): Payer: BLUE CROSS/BLUE SHIELD | Admitting: Obstetrics and Gynecology

## 2017-11-07 ENCOUNTER — Encounter: Payer: Self-pay | Admitting: Obstetrics and Gynecology

## 2017-11-07 VITALS — BP 122/72 | HR 84 | Ht 63.0 in | Wt 229.0 lb

## 2017-11-07 DIAGNOSIS — Z4889 Encounter for other specified surgical aftercare: Secondary | ICD-10-CM

## 2017-11-07 MED ORDER — LIDOCAINE-PRILOCAINE 2.5-2.5 % EX CREA: 1.0000 "application " | TOPICAL_CREAM | CUTANEOUS | 0 refills | Status: DC | PRN

## 2017-11-07 NOTE — Progress Notes (Signed)
    Postoperative Follow-up Patient presents post op from TLH, cystoscopy  1weeks ago for abnormal uterine bleeding.  Subjective: Patient reports some improvement in her preop symptoms. Eating a regular diet without difficulty. Pain is controlled with current analgesics. Medications being used: ibuprofen and percocet.  Activity: normal activities of daily living.  Objective: Blood pressure 122/72, pulse 84, height 5' 3" (1.6 m), weight 229 lb (103.9 kg), last menstrual period 08/13/2017, not currently breastfeeding.  General: NAD Pulmonary: no increased work of breathing Abdomen: soft, non-tender, non-distended, incision(s) D/C/I Extremities: no edema Neurologic: normal gait  Admission on 10/30/2017, Discharged on 10/31/2017  Component Date Value Ref Range Status  . Preg Test, Ur 10/30/2017 NEGATIVE  NEGATIVE Final   Comment:        THE SENSITIVITY OF THIS METHODOLOGY IS >24 mIU/mL   . SURGICAL PATHOLOGY 10/30/2017    Final-Edited                   Value:Surgical Pathology CASE: [id number] PATIENT: Holly Nicholson Surgical Pathology Report     SPECIMEN SUBMITTED: A. Uterus with cervix  CLINICAL HISTORY: None provided  PRE-OPERATIVE DIAGNOSIS: AUB  POST-OPERATIVE DIAGNOSIS: Same as pre-op     DIAGNOSIS: A. UTERUS WITH CERVIX; TOTAL HYSTERECTOMY: - CHRONIC CERVICITIS. 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 - UNREMARKABLE MYOMETRIUM. - NEGATIVE FOR ATYPIA AND MALIGNANCY.   GROSS DESCRIPTION: A. Labeled: Uterus with cervix Received: In formalin Weight: 121 grams Dimensions:      Fundus -6.4 x 5.7 x 4.1 cm      Cervix -3.8 x 3.6 cm with an external os of 1.1 cm Serosa: Pink-tan with focal ecchymosis Cervix: Smooth purple Endocervix: Trabecular pink-tan Endometrial cavity:      Dimensions -4.3 x 2.8 cm      Thickness -0.3-0.5 cm      Other findings -irregular contour, red-tan  ,lush Myometrium:     Thickness -2.4 cm     Other findings -no                         ne noted Block summary: 1 - representative anterior cervix 2 - representative posterior cervix 3 - representative anterior endomyometrium 4 - representative posterior endomyometrium  Final Diagnosis performed by Tara Rubinas, MD.   Electronically signed 10/31/2017 1:38:10PM The electronic signature indicates that the named Attending Pathologist has evaluated the specimen  Technical component performed at [facility], [address], [place], [place] [address] Lab: [phone] Dir: Sanjai Nagendra, MD, MMM  Professional component performed at [facility], [facility], [address], [place], [place] [address] Lab: [phone] Dir: Tara C. Rubinas, MD   . WBC 10/31/2017 10.3  4.0 - 10.5 K/uL Final  . RBC 10/31/2017 3.93  3.87 - 5.11 MIL/uL Final  . Hemoglobin 10/31/2017 10.5* 12.0 - 15.0 g/dL Final  . HCT 10/31/2017 32.6* 36.0 - 46.0 % Final  . MCV 10/31/2017 83.0  80.0 - 100.0 fL Final  . MCH 10/31/2017 26.7  26.0 - 34.0 pg Final  . MCHC 10/31/2017 32.2  30.0 - 36.0 g/dL Final  . RDW 10/31/2017 14.9  11.5 - 15.5 % Final  . Platelets 10/31/2017 159  150 - 400 K/uL Final  . nRBC 10/31/2017 0.0  0.0 - 0.2 % Final   Performed at [facility], [address] [place], [place] [address]  . Sodium 10/31/2017 142  135 - 145 mmol/L Final  . Potassium 10/31/2017 4.1    3.5 - 5.1 mmol/L Final  . Chloride 10/31/2017 109  98 - 111 mmol/L Final  . CO2 10/31/2017 28  22 - 32 mmol/L Final  . Glucose, Bld 10/31/2017 156* 70 - 99 mg/dL Final  . BUN 10/31/2017 6  6 - 20 mg/dL Final  . Creatinine, Ser 10/31/2017 0.72  0.44 - 1.00 mg/dL Final  . Calcium 10/31/2017 8.4* 8.9 - 10.3 mg/dL Final  . GFR calc non Af Amer 10/31/2017 >60  >60 mL/min Final  . GFR calc Af Amer 10/31/2017 >60  >60 mL/min Final   Comment: (NOTE) The eGFR has been calculated using the CKD EPI equation. This calculation  has not been validated in all clinical situations. eGFR's persistently <60 mL/min signify possible Chronic Kidney Disease.   Georgiann Hahn gap 10/31/2017 5  5 - 15 Final   Performed at [facility], [address] [place], [place] [address]    Assessment: 32 y.o. s/p TLH, cystoscopy stable  Plan: Patient has done well after surgery with no apparent complications.  I have discussed the post-operative course to date, and the expected progress moving forward.  The patient understands what complications to be concerned about.  I will see the patient in routine follow up, or sooner if needed.    Activity plan: No heavy lifting, no intercourse   Malachy Mood, MD, [place], [facility] 11/07/2017, 9:55 PM

## 2017-11-30 ENCOUNTER — Telehealth: Payer: Self-pay

## 2017-11-30 NOTE — Telephone Encounter (Signed)
FMLA/DISABILITY update for [employer] filled out, signature obtained, and given to [place] for processing.

## 2017-12-11 ENCOUNTER — Encounter: Payer: Self-pay | Admitting: Obstetrics and Gynecology

## 2017-12-11 ENCOUNTER — Ambulatory Visit (INDEPENDENT_AMBULATORY_CARE_PROVIDER_SITE_OTHER): Payer: BLUE CROSS/BLUE SHIELD | Admitting: Obstetrics and Gynecology

## 2017-12-11 ENCOUNTER — Telehealth: Payer: Self-pay

## 2017-12-11 VITALS — BP 112/76 | HR 91 | Ht 62.0 in | Wt 234.0 lb

## 2017-12-11 DIAGNOSIS — Z4889 Encounter for other specified surgical aftercare: Secondary | ICD-10-CM

## 2017-12-11 NOTE — Progress Notes (Signed)
Postoperative Follow-up Patient presents post op fromTLH, cystoscopy 6weeks ago for abnormal uterine bleeding.  Subjective: Patient reports marked improvement in her preop symptoms. Eating a regular diet without difficulty. The patient is not having any pain.  Activity: normal activities of daily living.  Objective: Blood pressure 112/76, pulse 91, height 5' 2"  (1.575 m), weight 234 lb (106.1 kg), last menstrual period 08/13/2017, not currently breastfeeding.  General: NAD Pulmonary: no increased work of breathing Abdomen: soft, non-tender, non-distended, incision(s) D/C/I GU: normal external female genitalia vaginal cuff intact, well healed Extremities: no edema Neurologic: normal gait    Admission on 10/30/2017, Discharged on 10/31/2017  Component Date Value Ref Range Status  . Preg Test, Ur 10/30/2017 NEGATIVE  NEGATIVE Final   Comment:        THE SENSITIVITY OF THIS METHODOLOGY IS >24 mIU/mL   . SURGICAL PATHOLOGY 10/30/2017    Final-Edited                   Value:Surgical Pathology CASE: [id number] PATIENT: Holly Nicholson Surgical Pathology Report     SPECIMEN SUBMITTED: A. Uterus with cervix  CLINICAL HISTORY: None provided  PRE-OPERATIVE DIAGNOSIS: AUB  POST-OPERATIVE DIAGNOSIS: Same as pre-op     DIAGNOSIS: A. UTERUS WITH CERVIX; TOTAL HYSTERECTOMY: - CHRONIC CERVICITIS. 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 - UNREMARKABLE MYOMETRIUM. - NEGATIVE FOR ATYPIA AND MALIGNANCY.   GROSS DESCRIPTION: A. Labeled: Uterus with cervix Received: In formalin Weight: 121 grams Dimensions:      Fundus -6.4 x 5.7 x 4.1 cm      Cervix -3.8 x 3.6 cm with an external os of 1.1 cm Serosa: Pink-tan with focal ecchymosis Cervix: Smooth purple Endocervix: Trabecular pink-tan Endometrial cavity:      Dimensions -4.3 x 2.8 cm      Thickness -0.3-0.5 cm      Other findings -irregular contour,  red-tan ,lush Myometrium:     Thickness -2.4 cm     Other findings -no                         ne noted Block summary: 1 - representative anterior cervix 2 - representative posterior cervix 3 - representative anterior endomyometrium 4 - representative posterior endomyometrium  Final Diagnosis performed by Quay Burow, MD.   Electronically signed 10/31/2017 1:38:10PM The electronic signature indicates that the named Attending Pathologist has evaluated the specimen  Technical component performed at [facility], [address] [place], [place] [address] Lab: [phone] Dir: Rush Farmer, MD, MMM  Professional component performed at [facility], [facility], [place], [place], [place] [address] Lab: [phone] Dir: Dellia Nims. Rubinas, MD   . WBC 10/31/2017 10.3  4.0 - 10.5 K/uL Final  . RBC 10/31/2017 3.93  3.87 - 5.11 MIL/uL Final  . Hemoglobin 10/31/2017 10.5* 12.0 - 15.0 g/dL Final  . HCT 10/31/2017 32.6* 36.0 - 46.0 % Final  . MCV 10/31/2017 83.0  80.0 - 100.0 fL Final  . MCH 10/31/2017 26.7  26.0 - 34.0 pg Final  . MCHC 10/31/2017 32.2  30.0 - 36.0 g/dL Final  . RDW 10/31/2017 14.9  11.5 - 15.5 % Final  . Platelets 10/31/2017 159  150 - 400 K/uL Final  . nRBC 10/31/2017 0.0  0.0 - 0.2 % Final   Performed at [facility], [address], [place], [place] [address]  . Sodium 10/31/2017 142  135 - 145 mmol/L Final  .  Potassium 10/31/2017 4.1  3.5 - 5.1 mmol/L Final  . Chloride 10/31/2017 109  98 - 111 mmol/L Final  . CO2 10/31/2017 28  22 - 32 mmol/L Final  . Glucose, Bld 10/31/2017 156* 70 - 99 mg/dL Final  . BUN 10/31/2017 6  6 - 20 mg/dL Final  . Creatinine, Ser 10/31/2017 0.72  0.44 - 1.00 mg/dL Final  . Calcium 10/31/2017 8.4* 8.9 - 10.3 mg/dL Final  . GFR calc non Af Amer 10/31/2017 >60  >60 mL/min Final  . GFR calc Af Amer 10/31/2017 >60  >60 mL/min Final   Comment: (NOTE) The eGFR has been calculated using the CKD EPI equation. This  calculation has not been validated in all clinical situations. eGFR's persistently <60 mL/min signify possible Chronic Kidney Disease.   Georgiann Hahn gap 10/31/2017 5  5 - 15 Final   Performed at [facility], [address] [place], [place] [address]    Assessment: 32 y.o. s/p TLH, cystoscopy stable  Plan: Patient has done well after surgery with no apparent complications.  I have discussed the post-operative course to date, and the expected progress moving forward.  The patient understands what complications to be concerned about.  I will see the patient in routine follow up, or sooner if needed.    Activity plan: No restriction.   Malachy Mood, MD, [facility], [facility] 12/11/2017, 11:34 AM

## 2017-12-11 NOTE — Telephone Encounter (Signed)
Has been addended won't print have her check to see if she can see it on my hart

## 2017-12-11 NOTE — Telephone Encounter (Signed)
Please advise 

## 2017-12-11 NOTE — Telephone Encounter (Signed)
Holly Nicholson printed and is faxing note

## 2017-12-11 NOTE — Telephone Encounter (Signed)
Pt needs work note amended to include 'no restrictions'.  Please fax to [phone]

## 2018-03-17 ENCOUNTER — Emergency Department: Payer: BLUE CROSS/BLUE SHIELD

## 2018-03-17 ENCOUNTER — Other Ambulatory Visit: Payer: Self-pay

## 2018-03-17 DIAGNOSIS — Z5321 Procedure and treatment not carried out due to patient leaving prior to being seen by health care provider: Secondary | ICD-10-CM | POA: Diagnosis not present

## 2018-03-17 DIAGNOSIS — R079 Chest pain, unspecified: Secondary | ICD-10-CM | POA: Insufficient documentation

## 2018-03-17 LAB — BASIC METABOLIC PANEL
Anion gap: 3 — ABNORMAL LOW (ref 5–15)
BUN: 9 mg/dL (ref 6–20)
CALCIUM: 8.2 mg/dL — AB (ref 8.9–10.3)
CO2: 26 mmol/L (ref 22–32)
CREATININE: 0.77 mg/dL (ref 0.44–1.00)
Chloride: 111 mmol/L (ref 98–111)
Glucose, Bld: 103 mg/dL — ABNORMAL HIGH (ref 70–99)
Potassium: 3.8 mmol/L (ref 3.5–5.1)
SODIUM: 140 mmol/L (ref 135–145)

## 2018-03-17 LAB — CBC
HCT: 35.3 % — ABNORMAL LOW (ref 36.0–46.0)
Hemoglobin: 12.1 g/dL (ref 12.0–15.0)
MCH: 28.7 pg (ref 26.0–34.0)
MCHC: 34.3 g/dL (ref 30.0–36.0)
MCV: 83.8 fL (ref 80.0–100.0)
NRBC: 0 % (ref 0.0–0.2)
PLATELETS: 152 10*3/uL (ref 150–400)
RBC: 4.21 MIL/uL (ref 3.87–5.11)
RDW: 13.5 % (ref 11.5–15.5)
WBC: 6.2 10*3/uL (ref 4.0–10.5)

## 2018-03-17 LAB — TROPONIN I

## 2018-03-17 NOTE — ED Triage Notes (Signed)
Of note, patient is now complaining of a headache, being cold and describes headache as all over. Rates pain as 8

## 2018-03-17 NOTE — ED Triage Notes (Signed)
Chest pain in the central chest, radiates across her chest. Has tingling in her fingertips and "when I slap myself in the face I can't feel my face." Has been having Chest pain since Thursday. Has been working "so I couldn't come." NO difficulty breathing, denies N/V

## 2018-03-18 ENCOUNTER — Emergency Department
Admission: EM | Admit: 2018-03-18 | Discharge: 2018-03-18 | Disposition: A | Discharge status: Home / Self Care | Payer: BLUE CROSS/BLUE SHIELD | Attending: Emergency Medicine | Admitting: Emergency Medicine

## 2018-03-18 ENCOUNTER — Telehealth: Payer: Self-pay | Admitting: Emergency Medicine

## 2018-03-18 NOTE — Telephone Encounter (Signed)
Called patient due to lwot to inquire about condition and follow up plans.  No answer and no voicemail  

## 2018-05-03 ENCOUNTER — Other Ambulatory Visit: Payer: Self-pay

## 2018-05-03 DIAGNOSIS — Z79899 Other long term (current) drug therapy: Secondary | ICD-10-CM

## 2018-05-03 DIAGNOSIS — F1910 Other psychoactive substance abuse, uncomplicated: Secondary | ICD-10-CM | POA: Insufficient documentation

## 2018-05-03 DIAGNOSIS — F251 Schizoaffective disorder, depressive type: Secondary | ICD-10-CM

## 2018-05-03 DIAGNOSIS — F322 Major depressive disorder, single episode, severe without psychotic features: Secondary | ICD-10-CM | POA: Diagnosis not present

## 2018-05-03 DIAGNOSIS — Z20828 Contact with and (suspected) exposure to other viral communicable diseases: Secondary | ICD-10-CM | POA: Diagnosis not present

## 2018-05-03 DIAGNOSIS — I10 Essential (primary) hypertension: Secondary | ICD-10-CM | POA: Insufficient documentation

## 2018-05-03 DIAGNOSIS — F17210 Nicotine dependence, cigarettes, uncomplicated: Secondary | ICD-10-CM

## 2018-05-03 NOTE — ED Triage Notes (Signed)
Patient presents stating that she is "at her wit's end." States she has to go through too much. Took 10 Xanax and states she doesn't want to die but oh well. States it all started when she lost her parents but today is a hard day.

## 2018-05-04 ENCOUNTER — Inpatient Hospital Stay
Admission: AD | Admit: 2018-05-04 | Discharge: 2018-05-07 | DRG: 885 | Disposition: A | Discharge status: Home / Self Care | Payer: BLUE CROSS/BLUE SHIELD | Attending: Psychiatry | Admitting: Psychiatry

## 2018-05-04 ENCOUNTER — Inpatient Hospital Stay: Admission: AD | Admit: 2018-05-04 | Payer: BLUE CROSS/BLUE SHIELD | Admitting: Rehabilitation

## 2018-05-04 ENCOUNTER — Emergency Department
Admission: EM | Admit: 2018-05-04 | Discharge: 2018-05-04 | Disposition: A | Discharge status: Other Acute Inpatient Hospital | Payer: BLUE CROSS/BLUE SHIELD | Source: Home / Self Care | Attending: Emergency Medicine | Admitting: Emergency Medicine

## 2018-05-04 DIAGNOSIS — Z6841 Body Mass Index (BMI) 40.0 and over, adult: Secondary | ICD-10-CM

## 2018-05-04 DIAGNOSIS — Z91040 Latex allergy status: Secondary | ICD-10-CM | POA: Diagnosis not present

## 2018-05-04 DIAGNOSIS — Z8249 Family history of ischemic heart disease and other diseases of the circulatory system: Secondary | ICD-10-CM

## 2018-05-04 DIAGNOSIS — Z818 Family history of other mental and behavioral disorders: Secondary | ICD-10-CM | POA: Diagnosis not present

## 2018-05-04 DIAGNOSIS — M549 Dorsalgia, unspecified: Secondary | ICD-10-CM | POA: Diagnosis present

## 2018-05-04 DIAGNOSIS — Z91018 Allergy to other foods: Secondary | ICD-10-CM | POA: Diagnosis not present

## 2018-05-04 DIAGNOSIS — F909 Attention-deficit hyperactivity disorder, unspecified type: Secondary | ICD-10-CM | POA: Diagnosis present

## 2018-05-04 DIAGNOSIS — F322 Major depressive disorder, single episode, severe without psychotic features: Secondary | ICD-10-CM | POA: Diagnosis not present

## 2018-05-04 DIAGNOSIS — F1511 Other stimulant abuse, in remission: Secondary | ICD-10-CM | POA: Diagnosis not present

## 2018-05-04 DIAGNOSIS — Z79899 Other long term (current) drug therapy: Secondary | ICD-10-CM

## 2018-05-04 DIAGNOSIS — K219 Gastro-esophageal reflux disease without esophagitis: Secondary | ICD-10-CM | POA: Diagnosis present

## 2018-05-04 DIAGNOSIS — G8929 Other chronic pain: Secondary | ICD-10-CM | POA: Diagnosis present

## 2018-05-04 DIAGNOSIS — Z20828 Contact with and (suspected) exposure to other viral communicable diseases: Secondary | ICD-10-CM | POA: Diagnosis not present

## 2018-05-04 DIAGNOSIS — F339 Major depressive disorder, recurrent, unspecified: Secondary | ICD-10-CM | POA: Diagnosis not present

## 2018-05-04 DIAGNOSIS — E669 Obesity, unspecified: Secondary | ICD-10-CM | POA: Diagnosis present

## 2018-05-04 DIAGNOSIS — Z915 Personal history of self-harm: Secondary | ICD-10-CM

## 2018-05-04 DIAGNOSIS — G43909 Migraine, unspecified, not intractable, without status migrainosus: Secondary | ICD-10-CM | POA: Diagnosis present

## 2018-05-04 DIAGNOSIS — F1410 Cocaine abuse, uncomplicated: Secondary | ICD-10-CM | POA: Diagnosis present

## 2018-05-04 DIAGNOSIS — F1120 Opioid dependence, uncomplicated: Secondary | ICD-10-CM | POA: Diagnosis not present

## 2018-05-04 DIAGNOSIS — T424X2A Poisoning by benzodiazepines, intentional self-harm, initial encounter: Secondary | ICD-10-CM | POA: Diagnosis present

## 2018-05-04 DIAGNOSIS — F1220 Cannabis dependence, uncomplicated: Secondary | ICD-10-CM | POA: Diagnosis not present

## 2018-05-04 DIAGNOSIS — F329 Major depressive disorder, single episode, unspecified: Secondary | ICD-10-CM | POA: Diagnosis present

## 2018-05-04 DIAGNOSIS — F332 Major depressive disorder, recurrent severe without psychotic features: Secondary | ICD-10-CM | POA: Diagnosis not present

## 2018-05-04 DIAGNOSIS — F603 Borderline personality disorder: Secondary | ICD-10-CM | POA: Diagnosis not present

## 2018-05-04 DIAGNOSIS — G4700 Insomnia, unspecified: Secondary | ICD-10-CM | POA: Diagnosis present

## 2018-05-04 DIAGNOSIS — F1910 Other psychoactive substance abuse, uncomplicated: Secondary | ICD-10-CM

## 2018-05-04 DIAGNOSIS — F411 Generalized anxiety disorder: Secondary | ICD-10-CM | POA: Diagnosis present

## 2018-05-04 DIAGNOSIS — F17210 Nicotine dependence, cigarettes, uncomplicated: Secondary | ICD-10-CM | POA: Diagnosis present

## 2018-05-04 DIAGNOSIS — Z5181 Encounter for therapeutic drug level monitoring: Secondary | ICD-10-CM | POA: Diagnosis not present

## 2018-05-04 DIAGNOSIS — I10 Essential (primary) hypertension: Secondary | ICD-10-CM | POA: Diagnosis not present

## 2018-05-04 LAB — URINE DRUG SCREEN, QUALITATIVE (ARMC ONLY)
Amphetamines, Ur Screen: NOT DETECTED
Barbiturates, Ur Screen: NOT DETECTED
Benzodiazepine, Ur Scrn: POSITIVE — AB
Cannabinoid 50 Ng, Ur ~~LOC~~: POSITIVE — AB
Cocaine Metabolite,Ur ~~LOC~~: NOT DETECTED
MDMA (Ecstasy)Ur Screen: NOT DETECTED
Methadone Scn, Ur: NOT DETECTED
Opiate, Ur Screen: NOT DETECTED
Phencyclidine (PCP) Ur S: NOT DETECTED
Tricyclic, Ur Screen: NOT DETECTED

## 2018-05-04 LAB — CBC
HCT: 36.2 % (ref 36.0–46.0)
Hemoglobin: 12.1 g/dL (ref 12.0–15.0)
MCH: 29.4 pg (ref 26.0–34.0)
MCHC: 33.4 g/dL (ref 30.0–36.0)
MCV: 87.9 fL (ref 80.0–100.0)
Platelets: 160 10*3/uL (ref 150–400)
RBC: 4.12 MIL/uL (ref 3.87–5.11)
RDW: 13.6 % (ref 11.5–15.5)
WBC: 6.1 10*3/uL (ref 4.0–10.5)
nRBC: 0 % (ref 0.0–0.2)

## 2018-05-04 LAB — COMPREHENSIVE METABOLIC PANEL
ALT: 17 U/L (ref 0–44)
AST: 17 U/L (ref 15–41)
Albumin: 4.6 g/dL (ref 3.5–5.0)
Alkaline Phosphatase: 79 U/L (ref 38–126)
Anion gap: 9 (ref 5–15)
BUN: 13 mg/dL (ref 6–20)
CO2: 27 mmol/L (ref 22–32)
Calcium: 9.3 mg/dL (ref 8.9–10.3)
Chloride: 106 mmol/L (ref 98–111)
Creatinine, Ser: 1.01 mg/dL — ABNORMAL HIGH (ref 0.44–1.00)
GFR calc Af Amer: >60 mL/min (ref >60)
GFR calc non Af Amer: >60 mL/min (ref >60)
Glucose, Bld: 110 mg/dL — ABNORMAL HIGH (ref 70–99)
Potassium: 3.5 mmol/L (ref 3.5–5.1)
Sodium: 142 mmol/L (ref 135–145)
Total Bilirubin: 0.9 mg/dL (ref 0.3–1.2)
Total Protein: 7.4 g/dL (ref 6.5–8.1)

## 2018-05-04 LAB — SALICYLATE LEVEL: Salicylate Lvl: <7 mg/dL (ref 2.8–30.0)

## 2018-05-04 LAB — ETHANOL: Alcohol, Ethyl (B): <10 mg/dL (ref <10)

## 2018-05-04 LAB — ACETAMINOPHEN LEVEL: Acetaminophen (Tylenol), Serum: <10 ug/mL — ABNORMAL LOW (ref 10–30)

## 2018-05-04 MED ORDER — ALUM & MAG HYDROXIDE-SIMETH 200-200-20 MG/5ML PO SUSP
30.0000 mL | ORAL | Status: DC | PRN
  Administered 2018-05-06: 14:00:00 30 mL via ORAL
  Filled 2018-05-04: qty 30

## 2018-05-04 MED ORDER — ACETAMINOPHEN 325 MG PO TABS
650.0000 mg | ORAL_TABLET | Freq: Four times a day (QID) | ORAL | Status: DC | PRN
  Administered 2018-05-04 – 2018-05-06 (×3): 650 mg via ORAL
  Filled 2018-05-04 (×2): qty 2

## 2018-05-04 MED ORDER — ALUM & MAG HYDROXIDE-SIMETH 200-200-20 MG/5ML PO SUSP: 30.0000 mL | ORAL | Status: DC | PRN

## 2018-05-04 MED ORDER — ACETAMINOPHEN 325 MG PO TABS
ORAL_TABLET | ORAL | Status: AC
  Administered 2018-05-04: 21:00:00 650 mg via ORAL
  Filled 2018-05-04: qty 2

## 2018-05-04 MED ORDER — MAGNESIUM HYDROXIDE 400 MG/5ML PO SUSP: 30.0000 mL | Freq: Every day | ORAL | Status: DC | PRN

## 2018-05-04 MED ORDER — NICOTINE 21 MG/24HR TD PT24
21.0000 mg | MEDICATED_PATCH | Freq: Every day | TRANSDERMAL | Status: DC
  Administered 2018-05-04: 21 mg via TRANSDERMAL
  Filled 2018-05-04: qty 1

## 2018-05-04 MED ORDER — ACETAMINOPHEN 325 MG PO TABS
650.0000 mg | ORAL_TABLET | Freq: Four times a day (QID) | ORAL | Status: DC | PRN
  Administered 2018-05-04: 650 mg via ORAL

## 2018-05-04 NOTE — ED Notes (Signed)
SOC called report given.  Pt. Escorted to interview room to talk to [facility].

## 2018-05-04 NOTE — ED Notes (Signed)
Nurse talked to patient and she states 'I just don't want to be here anymore, and I don't like the word suicide, but I hope to leave this world,states that she did cocaine and smoked blunts this past week and took several xanax,  I have been thru too much shit, Patient contracts for safety here, and she talked about how her parents died and she misses them so much, q 15 minute checks and camera surveillance in progress for safety.

## 2018-05-04 NOTE — ED Notes (Addendum)
Personal belongings in the bag: Flip flops, bra, shirt, underwear, socks and stretchy pants. Two pair of yellow metal earrings. Chrome colored metal necklace with a green glass stone in the middle, belly button ring placed into a specimen cup for patient. Left ear cartilage piercing unable to be removed. Nose piercing also placed in the cup had very small clear stone. Small black purse also placed in her belongings bag.

## 2018-05-04 NOTE — ED Notes (Signed)
Pt. Moved to interview room to talk to TTS.  TTS interview started.

## 2018-05-04 NOTE — BH Assessment (Addendum)
Patient will be admitted to [facility]

## 2018-05-04 NOTE — ED Notes (Signed)

## 2018-05-04 NOTE — ED Notes (Signed)
IVC/SOC completed/ Admit to [facility] after shift change

## 2018-05-04 NOTE — ED Notes (Signed)
Pt. Completed TTS interview.

## 2018-05-04 NOTE — ED Notes (Signed)
Patient ate 100% of supper and beverage.  

## 2018-05-04 NOTE — ED Notes (Signed)
SOC called, recommends inpatient services and IVC.

## 2018-05-04 NOTE — ED Notes (Signed)
Patient sleeping, food try left beside her bed.

## 2018-05-04 NOTE — ED Provider Notes (Signed)
[facility] Emergency Department Provider Note  ____________________________________________  Time seen: Approximately 3:12 AM  I have reviewed the triage vital signs and the nursing notes.   HISTORY  Chief Complaint Medical Clearance and Drug Overdose (10 Xanax at 2250)    HPI Holly Nicholson is a 33 y.o. female with a history of anxiety depression hypertension and GERD comes the ED complaining of depression.  She has had depressed mood, altered sleep.  She feels apathetic about being alive.  Symptoms are constant and severe for the past several days.  She feels severely stressed by the death of her parents who both died 2 and 3 years ago, and the current state of emergency related to pandemic.  No current intent to kill her self, no HI or hallucinations, but states that she took about 10 Xanax tablets tonight in an attempt to get high.  She also had a strong urge to snort cocaine but did not.      Past Medical History:  Diagnosis Date  . Anxiety   . BMI 45.0-49.9, adult (HCC)   . Depression   . Elevated blood pressure   . GERD (gastroesophageal reflux disease)   . History of bilateral tubal ligation 2007  . History of bilateral tubal ligation 2007  . Hypertension   . Low-lying placenta   . Lower extremity edema   . Tubal pregnancy Aug 2016     Patient Active Problem List   Diagnosis Date Noted  . S/P laparoscopic hysterectomy 10/30/2017  . Depression 12/25/2016  . Female sterility 11/01/2016  . BMI 45.0-49.9, adult (HCC) 04/27/2016  . Influenza B 04/13/2016  . Biliary colic 09/01/2015  . Cholelithiasis without cholecystitis   . Swelling of both ankles 07/21/2015  . Morbid obesity (HCC) 09/06/2014  . Vitamin D deficiency 09/04/2014  . Foot pain, right 09/04/2014  . Generalized anxiety disorder 08/20/2014  . Emesis 08/20/2014  . Bipolar affective disorder, currently depressed, moderate (HCC) 08/20/2014  . Other fatigue 08/20/2014  .  Tobacco abuse 08/20/2014  . Preventative health care 08/20/2014  . Transient elevated blood pressure 08/20/2014  . Encounter for sterilization 08/20/2014     Past Surgical History:  Procedure Laterality Date  . CHOLECYSTECTOMY N/A 09/02/2015   Procedure: LAPAROSCOPIC CHOLECYSTECTOMY;  Surgeon: Leafy Ro, MD;  Location: [facility] ORS;  Service: General;  Laterality: N/A;  . KNEE SURGERY    . LAPAROSCOPIC HYSTERECTOMY Bilateral 10/30/2017   Procedure: HYSTERECTOMY TOTAL LAPAROSCOPIC-BILATERAL SALPINGECTOMY;  Surgeon: Vena Austria, MD;  Location: [facility] ORS;  Service: Gynecology;  Laterality: Bilateral;  . LAPAROSCOPIC SALPINGO OOPHERECTOMY N/A 10/19/2016   Procedure: LAPAROSCOPIC SALPINGECTOMY bilateral;  Surgeon: Nadara Mustard, MD;  Location: [facility] ORS;  Service: Gynecology;  Laterality: N/A;  . TUBAL LIGATION    . Tubal reanastamosis       Prior to Admission medications   Medication Sig Start Date End Date Taking? Authorizing Provider  esomeprazole (NEXIUM) 20 MG capsule Take 20 mg by mouth daily as needed (acid reflux).    [provider]  ibuprofen (ADVIL,MOTRIN) 600 MG tablet Take 1 tablet (600 mg total) by mouth every 6 (six) hours as needed. 10/31/17   Vena Austria, MD  loratadine (CLARITIN) 10 MG tablet Take 10 mg by mouth daily as needed for allergies.     [provider]     Allergies Onion and Latex   Family History  Problem Relation Age of Onset  . Heart disease Mother   . Hyperlipidemia Mother   .  Hypertension Mother   . Depression Mother   . Anxiety disorder Mother   . Diabetes Father   . Heart disease Father   . Hyperlipidemia Father   . Hypertension Father   . Stroke Father   . Cancer Maternal Grandfather        lung  . Cancer Paternal Grandmother        stomach  . Epilepsy Daughter   . Asthma Son   . Stroke Paternal Grandfather   . Depression Maternal Aunt   . Anxiety disorder Maternal Aunt     Social History Social  History   Tobacco Use  . Smoking status: Current Every Day Smoker    Packs/day: 0.25    Years: 15.00    Pack years: 3.75    Types: Cigarettes    Last attempt to quit: 10/18/2015    Years since quitting: 2.5  . Smokeless tobacco: Never Used  Substance Use Topics  . Alcohol use: Not Currently    Comment: occ.  . Drug use: No    Review of Systems  Constitutional:   No fever or chills.  ENT:   No sore throat. No rhinorrhea. Cardiovascular:   No chest pain or syncope. Respiratory:   No dyspnea or cough. Gastrointestinal:   Negative for abdominal pain, vomiting and diarrhea.  Musculoskeletal:   Negative for focal pain or swelling All other systems reviewed and are negative except as documented above in ROS and HPI.  ____________________________________________   PHYSICAL EXAM:  VITAL SIGNS: ED Triage Vitals [05/03/18 2352]  Enc Vitals Group     BP 130/76     Pulse Rate 71     Resp 16     Temp 98.3 F (36.8 C)     Temp Source Oral     SpO2 100 %     Weight 235 lb (106.6 kg)     Height 5\' 2"  (1.575 m)     Head Circumference      Peak Flow      Pain Score 0     Pain Loc      Pain Edu?      Excl. in GC?     Vital signs reviewed, nursing assessments reviewed.   Constitutional:   Alert and oriented. Non-toxic appearance. Eyes:   Conjunctivae are normal. EOMI. PERRL. ENT      Head:   Normocephalic and atraumatic.        Neck:   No meningismus. Full ROM. Hematological/Lymphatic/Immunilogical:   No cervical lymphadenopathy. Cardiovascular:   RRR. Symmetric bilateral radial and DP pulses.  No murmurs. Cap refill less than 2 seconds. Respiratory:   Normal respiratory effort without tachypnea/retractions. Breath sounds are clear and equal bilaterally. No wheezes/rales/rhonchi. Gastrointestinal:   Soft and nontender. Non distended. There is no CVA tenderness.  No rebound, rigidity, or guarding. Musculoskeletal:   Normal range of motion in all extremities. No joint  effusions.  No lower extremity tenderness.  No edema. Neurologic:   Normal speech and language.  Motor grossly intact. No acute focal neurologic deficits are appreciated.  Skin:    Skin is warm, dry and intact. No rash noted.  No petechiae, purpura, or bullae.  No wounds  ____________________________________________    LABS (pertinent positives/negatives) (all labs ordered are listed, but only abnormal results are displayed) Labs Reviewed  COMPREHENSIVE METABOLIC PANEL - Abnormal; Notable for the following components:      Result Value   Glucose, Bld 110 (*)    Creatinine, Ser 1.01 (*)  All other components within normal limits  URINE DRUG SCREEN, QUALITATIVE ([facility] ONLY) - Abnormal; Notable for the following components:   Cannabinoid 50 Ng, Ur [place] POSITIVE (*)    Benzodiazepine, Ur Scrn POSITIVE (*)    All other components within normal limits  ACETAMINOPHEN LEVEL - Abnormal; Notable for the following components:   Acetaminophen (Tylenol), Serum <10 (*)    All other components within normal limits  ETHANOL  CBC  SALICYLATE LEVEL  POC URINE PREG, ED   ____________________________________________   EKG    ____________________________________________    RADIOLOGY  No results found.  ____________________________________________   PROCEDURES Procedures  ____________________________________________    CLINICAL IMPRESSION / ASSESSMENT AND PLAN / ED COURSE  Medications ordered in the ED: Medications - No data to display  Pertinent labs & imaging results that were available during my care of the patient were reviewed by me and considered in my medical decision making (see chart for details).  Holly Nicholson was evaluated in Emergency Department on 05/04/2018 for the symptoms described in the history of present illness. She was evaluated in the context of the global COVID-19 pandemic, which necessitated consideration that the patient might be at risk for  infection with the SARS-CoV-2 virus that causes COVID-19. Institutional protocols and algorithms that pertain to the evaluation of patients at risk for COVID-19 are in a state of rapid change based on information released by regulatory bodies including the CDC and federal and state organizations. These policies and algorithms were followed during the patient's care in the ED.   Patient presents with symptoms of depression.  By her description the Xanax overdose was recreational and she does not actively want to kill herself but she does report apathy to being alive.  Will obtain psychiatry evaluation.  She is currently medically stable without signs of depressed mental status or respiratory drive  Clinical Course as of May 04 310  Sat May 04, 2018  0245 Report from [facility] psychiatrist is that they believe patient warrants inpatient psychiatric treatment.  They plan to initiate IVC.   [PS]    Clinical Course User Index [PS] Sharman Cheek, MD     ----------------------------------------- 3:17 AM on 05/04/2018 -----------------------------------------  I have initiated IVC.  She is medically stable to proceed with psychiatric hospitalization when arranged.  ____________________________________________   FINAL CLINICAL IMPRESSION(S) / ED DIAGNOSES    Final diagnoses:  Polysubstance abuse (HCC)  Current severe episode of major depressive disorder without psychotic features, unspecified whether recurrent [facility])     ED Discharge Orders    None      Portions of this note were generated with dragon dictation software. Dictation errors may occur despite best attempts at proofreading.   Sharman Cheek, MD 05/04/18 [phone]

## 2018-05-04 NOTE — BH Assessment (Signed)
Patient is to be admitted to [facility] by Dr. Reita May.  Attending Physician will be Dr. Toni Amend.   Patient has been assigned to room 325, by [facility] Charge Nurse T'Yawn.   ER staff is aware of the admission:  Glenda, ER Secretary    Dr. Derrill Kay, ER MD   Fredia Beets., Patient's Nurse   Dondra Prader, Patient Access.

## 2018-05-04 NOTE — ED Notes (Signed)
Patient is alert and oriented, no signs of distress, will continue to monitor.  

## 2018-05-04 NOTE — ED Notes (Signed)
Patient observed lying in 20 hallway bed with eyes closed  Even, unlabored respirations observed   NAD pt appears to be sleeping  I will continue to monitor along with every 15 minute visual observations and ongoing security camera monitoring

## 2018-05-04 NOTE — ED Notes (Signed)
Patient ate 100% of lunch and beverage.  
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
## 2018-05-05 MED ORDER — HYDROXYZINE HCL 50 MG PO TABS
50.0000 mg | ORAL_TABLET | Freq: Four times a day (QID) | ORAL | Status: DC | PRN
  Filled 2018-05-05: qty 1

## 2018-05-05 MED ORDER — FLUOXETINE HCL 10 MG PO CAPS
10.0000 mg | ORAL_CAPSULE | Freq: Every day | ORAL | Status: DC
  Administered 2018-05-05 – 2018-05-07 (×3): 10 mg via ORAL
  Filled 2018-05-05 (×3): qty 1

## 2018-05-05 MED ORDER — OLANZAPINE 5 MG PO TABS: 5.0000 mg | ORAL_TABLET | Freq: Three times a day (TID) | ORAL | Status: DC | PRN

## 2018-05-05 MED ORDER — TRAZODONE HCL 50 MG PO TABS
50.0000 mg | ORAL_TABLET | Freq: Every day | ORAL | Status: DC
  Administered 2018-05-05 – 2018-05-06 (×2): 50 mg via ORAL
  Filled 2018-05-05 (×2): qty 1

## 2018-05-05 MED ORDER — LORATADINE 10 MG PO TABS: 10.0000 mg | ORAL_TABLET | Freq: Every day | ORAL | Status: DC | PRN

## 2018-05-05 MED ORDER — ZIPRASIDONE HCL 20 MG PO CAPS
20.0000 mg | ORAL_CAPSULE | Freq: Two times a day (BID) | ORAL | Status: DC
  Administered 2018-05-05 – 2018-05-06 (×4): 20 mg via ORAL
  Filled 2018-05-05 (×5): qty 1

## 2018-05-05 MED ORDER — PANTOPRAZOLE SODIUM 40 MG PO TBEC: 40.0000 mg | DELAYED_RELEASE_TABLET | Freq: Every day | ORAL | Status: DC

## 2018-05-05 MED ORDER — NICOTINE 21 MG/24HR TD PT24
21.0000 mg | MEDICATED_PATCH | Freq: Every day | TRANSDERMAL | Status: DC
  Administered 2018-05-05 – 2018-05-06 (×2): 21 mg via TRANSDERMAL
  Filled 2018-05-05 (×2): qty 1

## 2018-05-05 MED ORDER — NAPROXEN 250 MG PO TABS
250.0000 mg | ORAL_TABLET | Freq: Two times a day (BID) | ORAL | Status: DC | PRN
  Administered 2018-05-05 – 2018-05-07 (×3): 250 mg via ORAL
  Filled 2018-05-05 (×4): qty 1

## 2018-05-05 MED ORDER — OLANZAPINE 10 MG IM SOLR: 10.0000 mg | Freq: Two times a day (BID) | INTRAMUSCULAR | Status: DC | PRN

## 2018-05-05 NOTE — Plan of Care (Signed)
Patient admitted from ED for suicide attempt.where she ingested 10 Xanax pills. Pt has a history of depression and anxiety. She also has four kids. Searched upon admission by RN's no contraband found. She is Ox4, she admits being depressed and overdose attempt. She endorses using crack and marijuana also.and relapsing recently. She is currently in bed resting at this time.

## 2018-05-05 NOTE — H&P (Addendum)
Psychiatric Admission Assessment Adult  Patient Identification: Holly Nicholson MRN:  [id number] Date of Evaluation:  05/05/2018 Chief Complaint:  S/P Overdose Principal Diagnosis: Borderline personality disorder  Diagnosis:   Borderline personality disorder Major depressive disorder recurrent, severe without psychotic features (rule out bipolar disorder) Complicated grief Tobacco use disorder Cannabis use disorder, severe Cocaine use disorder, with recent relapse Opioid use disorder, moderate Amphetamine use disorder, in sustained remission  Obesity HTN Chronic Back pain Hx of Migraines.   History of Present Illness:  Holly Nicholson is a 33 year old Caucasian female from [place] who was urgently brought to the [facility] emergency department following an overdose on 10 Xanax of an unknown dosage.  She reflects initially trying to get high, but explains that she did not care if she did not wake up.  She reports having suicidal ideations for the past week, and due to a composite of stressors, she ended up relapsing on cocaine after being sober for 5 years.  She remarks "I have not been Holly Nicholson for a long time, I have been crying and depressed.  I do want to be here."  States that she has been consistently depressed since her mother passed away 3 years ago, her father then passed away 6 months later.  This year, her best friend died in 2018/02/15.  Over the past month, her depression has been an 8 out of 10.  Depression and irritability are worsened by alcohol, no alleviating factors.  She contracts for safety and denies any suicidal or homicidal thoughts.  No access to guns or weapons.  She denies any residual side effects from the overdose.  Patient reports global insomnia for many years, and has not taken any medications for it.  She reports low appetite and energy level.  Reports having intermittent and fairly sporadic auditory hallucinations every few months  that are inside of her head.  She does not know what they tell her.  They have not been command or persecutory. They do increase with stress.  She reports a history of mood swings, severe anger, history of cutting, unstable relationships and fears of abandonment.  She reports vague episodes of mood elevations that last 1 to 2 days in the absence of drugs and alcohol.  She comments that the mood elevations are her new baseline.  She denies a history of delusions and OCD.  Total Time spent with patient: 1 hour  Past Psychiatric History:  This is the patient's first psychiatric admission.  She has a history of bipolar disorder, depression, and ADHD.  She is not on any psychiatric medications.  She does not see a psychiatrist.  She has been on Abilify, Zoloft, BuSpar in the past with limited benefit.  Is the patient at risk to self? No.  Has the patient been a risk to self in the past 6 months? Yes.    Has the patient been a risk to self within the distant past? Yes.    Is the patient a risk to others? No.  Has the patient been a risk to others in the past 6 months? No.  Has the patient been a risk to others within the distant past? No.   Prior Inpatient Therapy:   Prior Outpatient Therapy:    Alcohol Screening: 1. How often do you have a drink containing alcohol?: Monthly or less 2. How many drinks containing alcohol do you have on a typical day when you are drinking?: 3 or 4 3. How often do  you have six or more drinks on one occasion?: Less than monthly AUDIT-C Score: 3 4. How often during the last year have you found that you were not able to stop drinking once you had started?: Less than monthly 5. How often during the last year have you failed to do what was normally expected from you becasue of drinking?: Less than monthly 6. How often during the last year have you needed a first drink in the morning to get yourself going after a heavy drinking session?: Less than monthly 7. How often  during the last year have you had a feeling of guilt of remorse after drinking?: Less than monthly 8. How often during the last year have you been unable to remember what happened the night before because you had been drinking?: Never 9. Have you or someone else been injured as a result of your drinking?: No 10. Has a relative or friend or a doctor or another health worker been concerned about your drinking or suggested you cut down?: No Alcohol Use Disorder Identification Test Final Score (AUDIT): 7 Alcohol Brief Interventions/Follow-up: Alcohol Education Substance Abuse History in the last 12 months:  Yes.   Consequences of Substance Abuse: Family Consequences:  Conflicts Previous Psychotropic Medications: Yes  Psychological Evaluations: Yes  Past Medical History:  Past Medical History:  Diagnosis Date  . Anxiety   . BMI 45.0-49.9, adult (HCC)   . Depression   . Elevated blood pressure   . GERD (gastroesophageal reflux disease)   . History of bilateral tubal ligation 2007  . History of bilateral tubal ligation 2007  . Hypertension   . Low-lying placenta   . Lower extremity edema   . Tubal pregnancy Aug 2016    Past Surgical History:  Procedure Laterality Date  . CHOLECYSTECTOMY N/A 09/02/2015   Procedure: LAPAROSCOPIC CHOLECYSTECTOMY;  Surgeon: Leafy Roiego F Pabon, MD;  Location: [facility] ORS;  Service: General;  Laterality: N/A;  . KNEE SURGERY    . LAPAROSCOPIC HYSTERECTOMY Bilateral 10/30/2017   Procedure: HYSTERECTOMY TOTAL LAPAROSCOPIC-BILATERAL SALPINGECTOMY;  Surgeon: Vena AustriaStaebler, Andreas, MD;  Location: [facility] ORS;  Service: Gynecology;  Laterality: Bilateral;  . LAPAROSCOPIC SALPINGO OOPHERECTOMY N/A 10/19/2016   Procedure: LAPAROSCOPIC SALPINGECTOMY bilateral;  Surgeon: Nadara MustardHarris, Robert P, MD;  Location: [facility] ORS;  Service: Gynecology;  Laterality: N/A;  . TUBAL LIGATION    . Tubal reanastamosis     Family History:  Family History  Problem Relation Age of Onset  . Heart disease  Mother   . Hyperlipidemia Mother   . Hypertension Mother   . Depression Mother   . Anxiety disorder Mother   . Diabetes Father   . Heart disease Father   . Hyperlipidemia Father   . Hypertension Father   . Stroke Father   . Cancer Maternal Grandfather        lung  . Cancer Paternal Grandmother        stomach  . Epilepsy Daughter   . Asthma Son   . Stroke Paternal Grandfather   . Depression Maternal Aunt   . Anxiety disorder Maternal Aunt    Family Psychiatric  History:  Says that her aunt and mother have bipolar disorder.  No family history of suicide attempts Tobacco Screening: Have you used any form of tobacco in the last 30 days? (Cigarettes, Smokeless Tobacco, Cigars, and/or Pipes): Yes Tobacco use, Select all that apply: 5 or more cigarettes per day Are you interested in Tobacco Cessation Medications?: No, patient refused Counseled patient on smoking cessation including  recognizing danger situations, developing coping skills and basic information about quitting provided: Refused/Declined practical counseling Social History:  Patient lives in [place] with her 4 kids.  She has been married twice before and is currently married but separated for 2 years.  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  No history of DUIs.  No legal problems  Social History   Substance and Sexual Activity  Alcohol Use Not Currently   Comment: occ.     Social History   Substance and Sexual Activity  Drug Use No    Additional Social History:             Substance abuse: Smokes 1-1/2 packs/day, cannabis daily, used methamphetamines on a moderate basis 5 years ago in addition to cocaine use.  She has been sober from heroin for 5 years.  She uses oxycodone and hydrocodone's a few times a month.  She uses cannabis on a daily basis             Allergies:   Allergies  Allergen Reactions  . Onion Anaphylaxis and Swelling    Raw onions causes throat to swell  . Latex Dermatitis    Lab Results:  Results for orders placed or performed during the hospital encounter of 05/04/18 (from the past 48 hour(s))  Comprehensive metabolic panel     Status: Abnormal   Collection Time: 05/04/18 12:10 AM  Result Value Ref Range   Sodium 142 135 - 145 mmol/L   Potassium 3.5 3.5 - 5.1 mmol/L   Chloride 106 98 - 111 mmol/L   CO2 27 22 - 32 mmol/L   Glucose, Bld 110 (H) 70 - 99 mg/dL   BUN 13 6 - 20 mg/dL   Creatinine, Ser 7.82 (H) 0.44 - 1.00 mg/dL   Calcium 9.3 8.9 - 95.6 mg/dL   Total Protein 7.4 6.5 - 8.1 g/dL   Albumin 4.6 3.5 - 5.0 g/dL   AST 17 15 - 41 U/L   ALT 17 0 - 44 U/L   Alkaline Phosphatase 79 38 - 126 U/L   Total Bilirubin 0.9 0.3 - 1.2 mg/dL   GFR calc non Af Amer >60 >60 mL/min   GFR calc Af Amer >60 >60 mL/min   Anion gap 9 5 - 15    Comment: Performed at [facility], [address] [place], [place] [address]  Ethanol     Status: None   Collection Time: 05/04/18 12:10 AM  Result Value Ref Range   Alcohol, Ethyl (B) <10 <10 mg/dL    Comment: (NOTE) Lowest detectable limit for serum alcohol is 10 mg/dL. For medical purposes only. Performed at [facility], [address] [place], [place] [address]   cbc     Status: None   Collection Time: 05/04/18 12:10 AM  Result Value Ref Range   WBC 6.1 4.0 - 10.5 K/uL   RBC 4.12 3.87 - 5.11 MIL/uL   Hemoglobin 12.1 12.0 - 15.0 g/dL   HCT 69.6 29.5 - 28.4 %   MCV 87.9 80.0 - 100.0 fL   MCH 29.4 26.0 - 34.0 pg   MCHC 33.4 30.0 - 36.0 g/dL   RDW 13.2 44.0 - 10.2 %   Platelets 160 150 - 400 K/uL   nRBC 0.0 0.0 - 0.2 %    Comment: Performed at [facility], [address], [place], [place] [address]  Urine Drug Screen, Qualitative     Status: Abnormal   Collection Time:  05/04/18 12:10 AM  Result Value Ref Range   Tricyclic, Ur Screen NONE DETECTED NONE DETECTED   Amphetamines, Ur Screen NONE DETECTED NONE DETECTED   MDMA (Ecstasy)Ur Screen NONE DETECTED NONE DETECTED    Cocaine Metabolite,Ur [place] NONE DETECTED NONE DETECTED   Opiate, Ur Screen NONE DETECTED NONE DETECTED   Phencyclidine (PCP) Ur S NONE DETECTED NONE DETECTED   Cannabinoid 50 Ng, Ur [place] POSITIVE (A) NONE DETECTED   Barbiturates, Ur Screen NONE DETECTED NONE DETECTED   Benzodiazepine, Ur Scrn POSITIVE (A) NONE DETECTED   Methadone Scn, Ur NONE DETECTED NONE DETECTED    Comment: (NOTE) Tricyclics + metabolites, urine    Cutoff 1000 ng/mL Amphetamines + metabolites, urine  Cutoff 1000 ng/mL MDMA (Ecstasy), urine              Cutoff 500 ng/mL Cocaine Metabolite, urine          Cutoff 300 ng/mL Opiate + metabolites, urine        Cutoff 300 ng/mL Phencyclidine (PCP), urine         Cutoff 25 ng/mL Cannabinoid, urine                 Cutoff 50 ng/mL Barbiturates + metabolites, urine  Cutoff 200 ng/mL Benzodiazepine, urine              Cutoff 200 ng/mL Methadone, urine                   Cutoff 300 ng/mL The urine drug screen provides only a preliminary, unconfirmed analytical test result and should not be used for non-medical purposes. Clinical consideration and professional judgment should be applied to any positive drug screen result due to possible interfering substances. A more specific alternate chemical method must be used in order to obtain a confirmed analytical result. Gas chromatography / mass spectrometry (GC/MS) is the preferred confirmat ory method. Performed at [facility], [address] [place], [place] [address]   Acetaminophen level     Status: Abnormal   Collection Time: 05/04/18 12:10 AM  Result Value Ref Range   Acetaminophen (Tylenol), Serum <10 (L) 10 - 30 ug/mL    Comment: (NOTE) Therapeutic concentrations vary significantly. A range of 10-30 ug/mL  may be an effective concentration for many patients. However, some  are best treated at concentrations outside of this range. Acetaminophen concentrations >150 ug/mL at 4 hours after ingestion  and >50 ug/mL  at 12 hours after ingestion are often associated with  toxic reactions. Performed at [facility], [address] [place], [place] [address]   Salicylate level     Status: None   Collection Time: 05/04/18 12:10 AM  Result Value Ref Range   Salicylate Lvl <7.0 2.8 - 30.0 mg/dL    Comment: Performed at [facility], [address] [place], [place] [address]    Blood Alcohol level:  Lab Results  Component Value Date   [facility] <10 05/04/2018    Metabolic Disorder Labs:  Lab Results  Component Value Date   HGBA1C 5.5 07/21/2015   No results found for: PROLACTIN Lab Results  Component Value Date   CHOL 154 08/20/2014   TRIG 159 (H) 08/20/2014   HDL 41 08/20/2014   LDLCALC 81 08/20/2014    Current Medications: Current Facility-Administered Medications  Medication Dose Route Frequency Provider Last Rate Last Dose  . acetaminophen (TYLENOL) tablet 650 mg  650 mg Oral Q6H PRN Reggie Pile, MD   650 mg at 05/05/18  [id number]  . alum & mag hydroxide-simeth (MAALOX/MYLANTA) 200-200-20 MG/5ML suspension 30 mL  30 mL Oral Q4H PRN Reggie Pile, MD      . FLUoxetine (PROZAC) capsule 10 mg  10 mg Oral Daily Reggie Pile, MD      . hydrOXYzine (ATARAX/VISTARIL) tablet 50 mg  50 mg Oral QID PRN Reggie Pile, MD      . magnesium hydroxide (MILK OF MAGNESIA) suspension 30 mL  30 mL Oral Daily PRN Reggie Pile, MD      . naproxen (NAPROSYN) tablet 250 mg  250 mg Oral BID PRN Reggie Pile, MD      . nicotine (NICODERM CQ - dosed in mg/24 hours) patch 21 mg  21 mg Transdermal Daily Reggie Pile, MD      . OLANZapine [facility]) injection 10 mg  10 mg Intramuscular BID PRN Reggie Pile, MD      . OLANZapine [facility]) tablet 5 mg  5 mg Oral TID PRN Reggie Pile, MD      . traZODone (DESYREL) tablet 50 mg  50 mg Oral QHS Reggie Pile, MD      . ziprasidone (GEODON) capsule 20 mg  20 mg Oral BID WC Reggie Pile, MD       Facility-Administered Medications Ordered in Other Encounters   Medication Dose Route Frequency Provider Last Rate Last Dose  . loratadine (CLARITIN) tablet 10 mg  10 mg Oral Daily PRN Reggie Pile, MD      . pantoprazole (PROTONIX) EC tablet 40 mg  40 mg Oral Daily Reggie Pile, MD       PTA Medications: Medications Prior to Admission  Medication Sig Dispense Refill Last Dose  . ibuprofen (ADVIL,MOTRIN) 600 MG tablet Take 1 tablet (600 mg total) by mouth every 6 (six) hours as needed. (Patient not taking: Reported on 05/04/2018) 60 tablet 3 Completed Course at Unknown time  . naproxen sodium (ALEVE) 220 MG tablet Take 220-440 mg by mouth 2 (two) times daily as needed (pain).   Unknown at PRN    Musculoskeletal: Strength & Muscle Tone: within normal limits Gait & Station: normal Patient leans: Left  Psychiatric Specialty Exam: Physical Exam  ROS  Blood pressure 124/76, pulse 86, temperature 98.2 F (36.8 C), temperature source Oral, resp. rate 18, height  (1.575 m), weight 104.3 kg, last menstrual period 08/13/2017, SpO2 99 %, not currently breastfeeding.Body mass index is 42.07 kg/m.  General Appearance: Casual  Eye Contact:  Good  Speech:  Clear and Coherent  Volume:  Normal  Mood:  Depressed  Affect:  Constricted and Depressed  Thought Process:  Coherent  Orientation:  Full (Time, Place, and Person)  Thought Content:  Logical  Suicidal Thoughts:  No  Homicidal Thoughts:  No  Memory:  Immediate;   Fair  Judgement:  Poor  Insight:  Lacking  Psychomotor Activity:  Normal  Concentration:  Concentration: Fair  Recall:  [employer] of Knowledge:  Fair  Language:  Good  Akathisia:  No  Handed:  Right  AIMS (if indicated):     Assets:  Communication Skills  ADL's:  Intact  Cognition:  WNL  Sleep:  Number of Hours: 5.5    Treatment Plan Summary: Daily contact with patient to assess and evaluate symptoms and progress in treatment   Patient seen and assessed Chart reviewed Laboratories reviewed Initiate suicide  precaution Provide the patient with structure and support Provided patient with group conservative therapies Labs and tests reviewed Start Prozac at 20 mg p.o. daily.  Risk benefits side effects and alternatives reviewed Initiate low-dose Geodon 20 mg twice daily with food.  Risk benefits side effects and alternatives reviewed.  Including EPS, NMS, weight gain, and sedation. Obtain baseline EKG Obtain lipid panel and hemoglobin A1c tomorrow morning We will provide the patient with hydroxyzine 50 mg 3 times daily as needed anxiety We may need to make a CPS report as she had overdosed while her children were in the house. Patient says that she does not have access to guns.  We will need to confirm prior to her discharge Provide patient with nicotine patch   Observation Level/Precautions:  15 minute checks  Laboratory:  [facility]  Psychotherapy:    Medications:    Consultations:    Discharge Concerns:    Estimated LOS:  Other:     Physician Treatment Plan for Primary Diagnosis: <principal problem not specified> Long Term Goal(s): Improvement in symptoms so as ready for discharge  Short Term Goals: Ability to demonstrate self-control will improve  Physician Treatment Plan for Secondary Diagnosis: Active Problems:   MDD (major depressive disorder)  Long Term Goal(s): Improvement in symptoms so as ready for discharge  Short Term Goals: Ability to demonstrate self-control will improve  I certify that inpatient services furnished can reasonably be expected to improve the patient's condition.    Reggie Pile, MD 4/26/20208:42 AM
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
## 2018-05-05 NOTE — Plan of Care (Signed)
Patient is newly admitted to the unit. States, "I sometimes have thoughts  of wanting to hurt myself but today I feel ok." Present in the milieu this shift. Compliant with medications and meals. Observed interacting with select peers today. Personal items brought in by family today and patient was very pleased with that. States, "Good now I can take a shower." New medications initiated today, see MAR. EKG done and results placed on the chart, results NSR. Will continue to monitor.

## 2018-05-05 NOTE — [facility] Suicide Risk Assessment (Signed)
[facility] INPATIENT:  Family/Significant Other Suicide Prevention Education  Suicide Prevention Education:  Patient Refusal for Family/Significant Other Suicide Prevention Education: The patient Holly Nicholson has refused to provide written consent for family/significant other to be provided Family/Significant Other Suicide Prevention Education during admission and/or prior to discharge.  Physician notified.  Katia Hannen  CUEBAS-COLON 05/05/2018, 12:13 PM

## 2018-05-05 NOTE — [facility] Group Notes (Signed)
LCSW Group Therapy Note 05/05/2018 1:15pm  Type of Therapy and Topic: Group Therapy: Feelings Around Returning Home & Establishing a Supportive Framework and Supporting Oneself When Supports Not Available  Participation Level: Active  Description of Group:  Patients first processed thoughts and feelings about upcoming discharge. These included fears of upcoming changes, lack of change, new living environments, judgements and expectations from others and overall stigma of mental health issues. The group then discussed the definition of a supportive framework, what that looks and feels like, and how do to discern it from an unhealthy non-supportive network. The group identified different types of supports as well as what to do when your family/friends are less than helpful or unavailable  Therapeutic Goals  1. Patient will identify one healthy supportive network that they can use at discharge. 2. Patient will identify one factor of a supportive framework and how to tell it from an unhealthy network. 3. Patient able to identify one coping skill to use when they do not have positive supports from others. 4. Patient will demonstrate ability to communicate their needs through discussion and/or role plays.  Summary of Patient Progress:  The patient reported she feels "sleepy." Pt engaged during group session. As patients processed their anxiety about discharge and described healthy supports patient shared she is ready to be discharge. She stated, "because I want to go home and see my kids." She listed a couple of friends and her aunt as her main support.  Patients identified at least one self-care tool they were willing to use after discharge; staying away from trouble, staying busy and working.   Therapeutic Modalities Cognitive Behavioral Therapy Motivational Interviewing   Anastyn Ayars  CUEBAS-COLON, LCSW 05/05/2018 11:47 AM

## 2018-05-05 NOTE — [facility] Suicide Risk Assessment (Addendum)
[facility]   Nursing information obtained from:  Patient Demographic factors:  Caucasian, Low socioeconomic status Current Mental Status:  Suicidal ideation indicated by patient Loss Factors:  Financial problems / change in socioeconomic status Historical Factors:  Prior suicide attempts Risk Reduction Factors:  Responsible for children under 35 years of age, Sense of responsibility to family  Total Time spent with patient: 1 hour Principal Problem: Borderline personality disorder Diagnosis:   Borderline personality disorder Major depressive disorder recurrent, severe without psychotic features (rule out bipolar disorder) Complicated grief Tobacco use disorder Cannabis use disorder, severe Cocaine use disorder, with recent relapse Opioid use disorder, moderate Amphetamine use disorder, in sustained remission  Obesity HTN Chronic Back pain Hx of Migraines.   Subjective Data:  [name] is a 33 year old Caucasian female [place] who was urgently brought to the [facility] emergency department following an overdose on 10 Xanax of an unknown dosage.  She reflects initially trying to get high, but explains that she did not care if she did not wake up.  She reports having suicidal ideations for the past week, and due to a composite of stressors, she ended up relapsing on cocaine after being sober for 5 years.  She remarks "I have not been Holly QuinLinda for a long time, I have been crying and depressed.  I do want to be here."  States that she has been consistently depressed since her mother passed away 3 years ago, her father then passed away 6 months later.  This year, her best friend died in January 2020.  Over the past month, her depression has been an 8 out of 10.  Depression and irritability are worsened by alcohol, no alleviating factors.  She contracts for safety and denies any suicidal or homicidal thoughts.  No access to guns or  weapons.  She denies any residual side effects from the overdose.  Patient reports global insomnia for many years, and has not taken any medications for it.  She reports low appetite and energy level.  Reports having intermittent and fairly sporadic auditory hallucinations every few months that are inside of her head.  She does not know what they tell her.  They have not been command or persecutory. They do increase with stress.  She reports a history of mood swings, severe anger, history of cutting, unstable relationships and fears of abandonment.  She reports vague episodes of mood elevations that last 1 to 2 days in the absence of drugs and alcohol.  She comments that the mood elevations are her new baseline.  She denies a history of delusions and OCD.  Continued Clinical Symptoms:  Alcohol Use Disorder Identification Test Final Score (AUDIT): 7 The "Alcohol Use Disorders Identification Test", Guidelines for Use in Primary Care, Second Edition.  World Science writerHealth Organization [facility](WHO). Score between 0-7:  no or low risk or alcohol related problems. Score between 8-15:  moderate risk of alcohol related problems. Score between 16-19:  high risk of alcohol related problems. Score 20 or above:  warrants further diagnostic evaluation for alcohol dependence and treatment.   CLINICAL FACTORS:   Depression:   Anhedonia    Musculoskeletal: Strength & Muscle Tone: within normal limits Gait & Station: normal Patient leans: Left  Psychiatric Specialty Exam: Physical Exam  ROS  Blood pressure 124/76, pulse 86, temperature 98.2 F (36.8 C), temperature source Oral, resp. rate 18, height 5\' 2"  (1.575 m), weight 104.3 kg, last menstrual period 08/13/2017, SpO2 99 %, not  currently breastfeeding.Body mass index is 42.07 kg/m.  General Appearance: Casual  Eye Contact:  Good  Speech:  Clear and Coherent  Volume:  Normal  Mood:  Depressed  Affect:  Constricted and Depressed  Thought Process:  Coherent   Orientation:  Full (Time, Place, and Person)  Thought Content:  Logical  Suicidal Thoughts:  No  Homicidal Thoughts:  No  Memory:  Immediate;   Fair  Judgement:  Poor  Insight:  Lacking  Psychomotor Activity:  Normal  Concentration:  Concentration: Fair  Recall:  [employer] of Knowledge:  Fair  Language:  Good  Akathisia:  No  Handed:  Right  AIMS (if indicated):     Assets:  Communication Skills  ADL's:  Intact  Cognition:  WNL  Sleep:  Number of Hours: 5.5     Musculoskeletal: Strength & Muscle Tone: within normal limits Gait & Station: normal Patient leans: Left  Psychiatric Specialty Exam: Physical Exam  ROS  Blood pressure 124/76, pulse 86, temperature 98.2 F (36.8 C), temperature source Oral, resp. rate 18, height 5\' 2"  (1.575 m), weight 104.3 kg, last menstrual period 08/13/2017, SpO2 99 %, not currently breastfeeding.Body mass index is 42.07 kg/m.  General Appearance: Casual  Eye Contact:  Good  Speech:  Clear and Coherent  Volume:  Normal  Mood:  Depressed  Affect:  Constricted and Depressed  Thought Process:  Coherent  Orientation:  Full (Time, Place, and Person)  Thought Content:  Logical  Suicidal Thoughts:  No  Homicidal Thoughts:  No  Memory:  Immediate;   Fair  Judgement:  Poor  Insight:  Lacking  Psychomotor Activity:  Normal  Concentration:  Concentration: Fair  Recall:  [employer] of Knowledge:  Fair  Language:  Good  Akathisia:  No  Handed:  Right  AIMS (if indicated):     Assets:  Communication Skills  ADL's:  Intact  Cognition:  WNL  Sleep:  Number of Hours: 5.5       COGNITIVE FEATURES THAT CONTRIBUTE TO RISK:  Polarized thinking    SUICIDE RISK:   Moderate:  Frequent suicidal ideation with limited intensity, and duration, some specificity in terms of plans, no associated intent, good self-control, limited dysphoria/symptomatology, some risk factors present, and identifiable protective factors, including available and  accessible social support.  PLAN OF CARE:   Patient seen and assessed Chart reviewed Laboratories reviewed Initiate suicide precaution Provide the patient with structure and support Provided patient with group conservative therapies Labs and tests reviewed Start Prozac at 20 mg p.o. daily.  Risk benefits side effects and alternatives reviewed Initiate low-dose Geodon 20 mg twice daily with food.  Risk benefits side effects and alternatives reviewed.  Including EPS, NMS, weight gain, and sedation. Obtain baseline EKG Obtain lipid panel and hemoglobin A1c tomorrow morning We will provide the patient with hydroxyzine 50 mg 3 times daily as needed anxiety We may need to make a CPS report as she had overdosed while her children were in the house. Patient says that she does not have access to guns.  We will need to confirm prior to her discharge Provide patient with nicotine patch   I certify that inpatient services furnished can reasonably be expected to improve the patient's condition.   Reggie Pile, MD 05/05/2018, 8:50 AM

## 2018-05-05 NOTE — Tx Team (Signed)
Initial Treatment Plan 05/05/2018 2:20 AM Holly Nicholson AVW:[id number]    PATIENT STRESSORS: Financial difficulties Substance abuse   PATIENT STRENGTHS: Average or above average intelligence General fund of knowledge   PATIENT IDENTIFIED PROBLEMS: Substance Abuse  Depression                   DISCHARGE CRITERIA:  Improved stabilization in mood, thinking, and/or behavior Verbal commitment to aftercare and medication compliance  PRELIMINARY DISCHARGE PLAN: Outpatient therapy Return to previous living arrangement  PATIENT/FAMILY INVOLVEMENT: This treatment plan has been presented to and reviewed with the patient, Holly Nicholson,.  The patient and family have been given the opportunity to ask questions and make suggestions.  Elbert Ewings, RN 05/05/2018, 2:20 AM

## 2018-05-06 LAB — HEMOGLOBIN A1C
Hgb A1c MFr Bld: 5 % (ref 4.8–5.6)
Mean Plasma Glucose: 96.8 mg/dL

## 2018-05-06 LAB — LIPID PANEL
Cholesterol: 133 mg/dL (ref 0–200)
HDL: 31 mg/dL — ABNORMAL LOW (ref >40)
LDL Cholesterol: 53 mg/dL (ref 0–99)
Total CHOL/HDL Ratio: 4.3 RATIO
Triglycerides: 245 mg/dL — ABNORMAL HIGH (ref <150)
VLDL: 49 mg/dL — ABNORMAL HIGH (ref 0–40)

## 2018-05-06 MED ORDER — ZIPRASIDONE HCL 20 MG PO CAPS: 20.0000 mg | ORAL_CAPSULE | Freq: Two times a day (BID) | ORAL | 1 refills | Status: DC

## 2018-05-06 MED ORDER — FLUOXETINE HCL 10 MG PO CAPS: 10.0000 mg | ORAL_CAPSULE | Freq: Every day | ORAL | 1 refills | Status: DC

## 2018-05-06 MED ORDER — TRAZODONE HCL 50 MG PO TABS: 50.0000 mg | ORAL_TABLET | Freq: Every day | ORAL | 1 refills | Status: DC

## 2018-05-06 NOTE — Plan of Care (Signed)
Pt. Endorses a normal mood. Pt. States she is doing better than when she was admitted to the unit. Pt. Verbalizes feelings appropriately. Pt. Denies si/hi/avh, able to contract for safety.    Problem: Coping: Goal: Coping ability will improve Outcome: Progressing Goal: Will verbalize feelings Outcome: Progressing

## 2018-05-06 NOTE — [facility] Suicide Risk Assessment (Signed)
[facility] Discharge Suicide Risk Assessment   Principal Problem: MDD (major depressive disorder) Discharge Diagnoses: Principal Problem:   MDD (major depressive disorder) Active Problems:   Generalized anxiety disorder   Total Time spent with patient: 1 hour  Musculoskeletal: Strength & Muscle Tone: within normal limits Gait & Station: normal Patient leans: Right  Psychiatric Specialty Exam: Review of Systems  Constitutional: Negative.   HENT: Negative.   Eyes: Negative.   Respiratory: Negative.   Cardiovascular: Negative.   Gastrointestinal: Negative.   Musculoskeletal: Negative.   Skin: Negative.   Neurological: Negative.   Psychiatric/Behavioral: Negative.     Blood pressure 97/63, pulse 86, temperature 98 F (36.7 C), temperature source Oral, resp. rate 18, height 5\' 2"  (1.575 m), weight 104.3 kg, last menstrual period 08/13/2017, SpO2 100 %, not currently breastfeeding.Body mass index is 42.07 kg/m.  General Appearance: Casual  Eye Contact::  Fair  Speech:  Normal Rate409  Volume:  Normal  Mood:  Euthymic  Affect:  Congruent  Thought Process:  Goal Directed  Orientation:  Full (Time, Place, and Person)  Thought Content:  Logical  Suicidal Thoughts:  No  Homicidal Thoughts:  No  Memory:  Immediate;   Good Recent;   Fair Remote;   Fair  Judgement:  Fair  Insight:  Fair  Psychomotor Activity:  Decreased  Concentration:  Fair  Recall:  [employer] of Knowledge:Fair  Language: Fair  Akathisia:  No  Handed:  Right  AIMS (if indicated):     Assets:  Desire for Improvement Housing Physical Health  Sleep:  Number of Hours: 5.5  Cognition: WNL  ADL's:  Intact   Mental Status Per Nursing Assessment::   On Admission:  Suicidal ideation indicated by patient  Demographic Factors:  Caucasian and Low socioeconomic status  Loss Factors: Financial problems/change in socioeconomic status  Historical Factors: Family history of mental illness or substance  abuse  Risk Reduction Factors:   Responsible for children under 59 years of age, Sense of responsibility to family, Religious beliefs about death, Employed, Positive social support and Positive therapeutic relationship  Continued Clinical Symptoms:  Depression:   Anhedonia  Cognitive Features That Contribute To Risk:  Closed-mindedness    Suicide Risk:  Minimal: No identifiable suicidal ideation.  Patients presenting with no risk factors but with morbid ruminations; may be classified as minimal risk based on the severity of the depressive symptoms  Follow-up Information    [facility] Follow up on 05/10/2018.   Why:  Please follow up with [facility] on Friday, May 1st at 12:30pm. Please take your ID, insurance card and hospital discharge paperwork with you to your appointment. Thank you. Contact information: [address] [place] [address] [phone]           Plan Of Care/Follow-up recommendations:  Activity:  Activity as tolerated Diet:  Regular diet Other:  Follow-up with outpatient treatment  Mordecai Rasmussen, MD 05/06/2018, 4:58 PM

## 2018-05-06 NOTE — Progress Notes (Signed)
[facility] MD Progress Note  05/06/2018 4:55 PM Holly Nicholson  MRN:  [id number] Subjective: Follow-up for this patient with major depression.  Patient came into the hospital after taking an overdose of Xanax.  On interview today the patient says she is feeling much better.  She denies any suicidal ideation.  She says that she has no wish to die and is mostly focused on wanting to be back home with her children.  Denies any psychotic symptoms.  Affect is euthymic and reactive.  She is taking care of her ADLs well.  Interacts decently with groups and individuals. Principal Problem: MDD (major depressive disorder) Diagnosis: Principal Problem:   MDD (major depressive disorder) Active Problems:   Generalized anxiety disorder  Total Time spent with patient: 20 minutes  Past Psychiatric History: Patient has a history of recurrent depression and mood instability.  Past Medical History:  Past Medical History:  Diagnosis Date  . Anxiety   . BMI 45.0-49.9, adult (HCC)   . Depression   . Elevated blood pressure   . GERD (gastroesophageal reflux disease)   . History of bilateral tubal ligation 2007  . History of bilateral tubal ligation 2007  . Hypertension   . Low-lying placenta   . Lower extremity edema   . Tubal pregnancy Aug 2016    Past Surgical History:  Procedure Laterality Date  . CHOLECYSTECTOMY N/A 09/02/2015   Procedure: LAPAROSCOPIC CHOLECYSTECTOMY;  Surgeon: Leafy Ro, MD;  Location: [facility] ORS;  Service: General;  Laterality: N/A;  . KNEE SURGERY    . LAPAROSCOPIC HYSTERECTOMY Bilateral 10/30/2017   Procedure: HYSTERECTOMY TOTAL LAPAROSCOPIC-BILATERAL SALPINGECTOMY;  Surgeon: Vena Austria, MD;  Location: [facility] ORS;  Service: Gynecology;  Laterality: Bilateral;  . LAPAROSCOPIC SALPINGO OOPHERECTOMY N/A 10/19/2016   Procedure: LAPAROSCOPIC SALPINGECTOMY bilateral;  Surgeon: Nadara Mustard, MD;  Location: [facility] ORS;  Service: Gynecology;  Laterality: N/A;  . TUBAL LIGATION     . Tubal reanastamosis     Family History:  Family History  Problem Relation Age of Onset  . Heart disease Mother   . Hyperlipidemia Mother   . Hypertension Mother   . Depression Mother   . Anxiety disorder Mother   . Diabetes Father   . Heart disease Father   . Hyperlipidemia Father   . Hypertension Father   . Stroke Father   . Cancer Maternal Grandfather        lung  . Cancer Paternal Grandmother        stomach  . Epilepsy Daughter   . Asthma Son   . Stroke Paternal Grandfather   . Depression Maternal Aunt   . Anxiety disorder Maternal Aunt    Family Psychiatric  History: Has a history of extensive mental illness and multiple family members Social History:  Social History   Substance and Sexual Activity  Alcohol Use Not Currently   Comment: occ.     Social History   Substance and Sexual Activity  Drug Use No    Social History   Socioeconomic History  . Marital status: Divorced    Spouse name: Not on file  . Number of children: 4  . Years of education: Not on file  . Highest education level: Some college, no degree  Occupational History  . Not on file  Social Needs  . Financial resource strain: Somewhat hard  . Food insecurity:    Worry: Sometimes true    Inability: Sometimes true  . Transportation needs:    Medical: No  Non-medical: No  Tobacco Use  . Smoking status: Current Every Day Smoker    Packs/day: 1.50    Years: 2.00    Pack years: 3.00    Types: Cigarettes  . Smokeless tobacco: Never Used  Substance and Sexual Activity  . Alcohol use: Not Currently    Comment: occ.  . Drug use: No  . Sexual activity: Yes    Birth control/protection: I.U.D., Surgical  Lifestyle  . Physical activity:    Days per week: 0 days    Minutes per session: 0 min  . Stress: Very much  Relationships  . Social connections:    Talks on phone: More than three times a week    Gets together: Never    Attends religious service: More than 4 times per year     Active member of club or organization: No    Attends meetings of clubs or organizations: Never    Relationship status: Divorced  Other Topics Concern  . Not on file  Social History Narrative  . Not on file   Additional Social History:                         Sleep: Fair  Appetite:  Fair  Current Medications: Current Facility-Administered Medications  Medication Dose Route Frequency Provider Last Rate Last Dose  . acetaminophen (TYLENOL) tablet 650 mg  650 mg Oral Q6H PRN Reggie PileJoshi, Anand, MD   650 mg at 05/05/18 0659  . alum & mag hydroxide-simeth (MAALOX/MYLANTA) 200-200-20 MG/5ML suspension 30 mL  30 mL Oral Q4H PRN Reggie PileJoshi, Anand, MD   30 mL at 05/06/18 1356  . FLUoxetine (PROZAC) capsule 10 mg  10 mg Oral Daily Reggie PileJoshi, Anand, MD   10 mg at 05/06/18 0755  . hydrOXYzine (ATARAX/VISTARIL) tablet 50 mg  50 mg Oral QID PRN Reggie PileJoshi, Anand, MD      . magnesium hydroxide (MILK OF MAGNESIA) suspension 30 mL  30 mL Oral Daily PRN Reggie PileJoshi, Anand, MD      . naproxen (NAPROSYN) tablet 250 mg  250 mg Oral BID PRN Reggie PileJoshi, Anand, MD   250 mg at 05/06/18 0758  . nicotine (NICODERM CQ - dosed in mg/24 hours) patch 21 mg  21 mg Transdermal Daily Reggie PileJoshi, Anand, MD   21 mg at 05/06/18 0814  . OLANZapine (ZYPREXA) injection 10 mg  10 mg Intramuscular BID PRN Reggie PileJoshi, Anand, MD      . OLANZapine [facility](ZYPREXA) tablet 5 mg  5 mg Oral TID PRN Reggie PileJoshi, Anand, MD      . traZODone (DESYREL) tablet 50 mg  50 mg Oral QHS Reggie PileJoshi, Anand, MD   50 mg at 05/05/18 2113  . ziprasidone (GEODON) capsule 20 mg  20 mg Oral BID WC Reggie PileJoshi, Anand, MD   20 mg at 05/06/18 [id number]   Facility-Administered Medications Ordered in Other Encounters  Medication Dose Route Frequency Provider Last Rate Last Dose  . loratadine (CLARITIN) tablet 10 mg  10 mg Oral Daily PRN Reggie PileJoshi, Anand, MD      . pantoprazole (PROTONIX) EC tablet 40 mg  40 mg Oral Daily Reggie PileJoshi, Anand, MD        Lab Results:  Results for orders placed or performed during the hospital  encounter of 05/04/18 (from the past 48 hour(s))  Hemoglobin A1c     Status: None   Collection Time: 05/06/18  6:26 AM  Result Value Ref Range   Hgb A1c MFr Bld 5.0 4.8 - 5.6 %  Comment: (NOTE) Pre diabetes:          5.7%-6.4% Diabetes:              >6.4% Glycemic control for   <7.0% adults with diabetes    Mean Plasma Glucose 96.8 mg/dL    Comment: Performed at [facility], [address], [place], [place] [address]  Lipid panel     Status: Abnormal   Collection Time: 05/06/18  6:26 AM  Result Value Ref Range   Cholesterol 133 0 - 200 mg/dL   Triglycerides 604 (H) <150 mg/dL   HDL 31 (L) >54 mg/dL   Total CHOL/HDL Ratio 4.3 RATIO   VLDL 49 (H) 0 - 40 mg/dL   LDL Cholesterol 53 0 - 99 mg/dL    Comment:        Total Cholesterol/HDL:CHD Risk Coronary Heart Disease Risk Table                     Men   Women  1/2 Average Risk   3.4   3.3  Average Risk       5.0   4.4  2 X Average Risk   9.6   7.1  3 X Average Risk  23.4   11.0        Use the calculated Patient Ratio above and the CHD Risk Table to determine the patient's CHD Risk.        ATP III CLASSIFICATION (LDL):  <100     mg/dL   Optimal  098-119  mg/dL   Near or Above                    Optimal  130-159  mg/dL   Borderline  147-829  mg/dL   High  >562     mg/dL   Very High Performed at [facility], [address] [place], [place] [address]     Blood Alcohol level:  Lab Results  Component Value Date   [facility] <10 05/04/2018    Metabolic Disorder Labs: Lab Results  Component Value Date   HGBA1C 5.0 05/06/2018   MPG 96.8 05/06/2018   No results found for: PROLACTIN Lab Results  Component Value Date   CHOL 133 05/06/2018   TRIG 245 (H) 05/06/2018   HDL 31 (L) 05/06/2018   CHOLHDL 4.3 05/06/2018   VLDL 49 (H) 05/06/2018   LDLCALC 53 05/06/2018   LDLCALC 81 08/20/2014    Physical Findings: AIMS:  , ,  ,  ,    CIWA:    COWS:     Musculoskeletal: Strength & Muscle Tone: within  normal limits Gait & Station: normal Patient leans: Backward  Psychiatric Specialty Exam: Physical Exam  Nursing note and vitals reviewed. Constitutional: She appears well-developed and well-nourished.  HENT:  Head: Normocephalic and atraumatic.  Eyes: Pupils are equal, round, and reactive to light. Conjunctivae are normal.  Neck: Normal range of motion.  Cardiovascular: Regular rhythm and normal heart sounds.  Respiratory: Effort normal.  GI: Soft.  Musculoskeletal: Normal range of motion.  Neurological: She is alert.  Skin: Skin is warm and dry.  Psychiatric: Her speech is normal and behavior is normal. Judgment and thought content normal. Her affect is blunt. Cognition and memory are normal.    Review of Systems  Constitutional: Negative.   HENT: Negative.   Eyes: Negative.   Respiratory: Negative.   Cardiovascular: Negative.   Gastrointestinal: Negative.   Musculoskeletal: Negative.   Skin:  Negative.   Neurological: Negative.   Psychiatric/Behavioral: Negative.     Blood pressure 97/63, pulse 86, temperature 98 F (36.7 C), temperature source Oral, resp. rate 18, height  (1.575 m), weight 104.3 kg, last menstrual period 08/13/2017, SpO2 100 %, not currently breastfeeding.Body mass index is 42.07 kg/m.  General Appearance: Casual  Eye Contact:  Good  Speech:  Clear and Coherent  Volume:  Normal  Mood:  Euthymic  Affect:  Congruent  Thought Process:  Goal Directed  Orientation:  Full (Time, Place, and Person)  Thought Content:  Logical  Suicidal Thoughts:  No  Homicidal Thoughts:  No  Memory:  Immediate;   Fair Recent;   Fair Remote;   Fair  Judgement:  Fair  Insight:  Fair  Psychomotor Activity:  Decreased  Concentration:  Concentration: Fair  Recall:  [employer] of Knowledge:  Fair  Language:  Fair  Akathisia:  No  Handed:  Right  AIMS (if indicated):     Assets:  Desire for Improvement Housing Physical Health Resilience  ADL's:  Intact   Cognition:  WNL  Sleep:  Number of Hours: 5.5     Treatment Plan Summary: Daily contact with patient to assess and evaluate symptoms and progress in treatment, Medication management and Plan Patient reports she is feeling much better and gives a lot of credit to her medicine.  Very much wants to continue the medication that was started by the psychiatrist over the weekend.  Orders will be completed for the Geodon and Prozac.  Medication use and side effects explained to patient.  Patient attended treatment team and was appropriate and requesting discharge.  Likely discharge tomorrow with follow-up arranged already in the community  Mordecai Rasmussen, MD 05/06/2018, 4:55 PM

## 2018-05-06 NOTE — Tx Team (Addendum)
Interdisciplinary Treatment and Diagnostic Plan Update  05/06/2018 Time of Session: 230PM Holly ParsleyLinda Riggs Nicholson MRN: [id number]  Principal Diagnosis: <principal problem not specified>  Secondary Diagnoses: Active Problems:   MDD (major depressive disorder)   Current Medications:  Current Facility-Administered Medications  Medication Dose Route Frequency Provider Last Rate Last Dose  . acetaminophen (TYLENOL) tablet 650 mg  650 mg Oral Q6H PRN Reggie PileJoshi, Anand, MD   650 mg at 05/05/18 0659  . alum & mag hydroxide-simeth (MAALOX/MYLANTA) 200-200-20 MG/5ML suspension 30 mL  30 mL Oral Q4H PRN Reggie PileJoshi, Anand, MD   30 mL at 05/06/18 1356  . FLUoxetine (PROZAC) capsule 10 mg  10 mg Oral Daily Reggie PileJoshi, Anand, MD   10 mg at 05/06/18 0755  . hydrOXYzine (ATARAX/VISTARIL) tablet 50 mg  50 mg Oral QID PRN Reggie PileJoshi, Anand, MD      . magnesium hydroxide (MILK OF MAGNESIA) suspension 30 mL  30 mL Oral Daily PRN Reggie PileJoshi, Anand, MD      . naproxen (NAPROSYN) tablet 250 mg  250 mg Oral BID PRN Reggie PileJoshi, Anand, MD   250 mg at 05/06/18 0758  . nicotine (NICODERM CQ - dosed in mg/24 hours) patch 21 mg  21 mg Transdermal Daily Reggie PileJoshi, Anand, MD   21 mg at 05/06/18 0814  . OLANZapine (ZYPREXA) injection 10 mg  10 mg Intramuscular BID PRN Reggie PileJoshi, Anand, MD      . OLANZapine [place](ZYPREXA) tablet 5 mg  5 mg Oral TID PRN Reggie PileJoshi, Anand, MD      . traZODone (DESYREL) tablet 50 mg  50 mg Oral QHS Reggie PileJoshi, Anand, MD   50 mg at 05/05/18 2113  . ziprasidone (GEODON) capsule 20 mg  20 mg Oral BID WC Reggie PileJoshi, Anand, MD   20 mg at 05/06/18 [id number]   Facility-Administered Medications Ordered in Other Encounters  Medication Dose Route Frequency Provider Last Rate Last Dose  . loratadine (CLARITIN) tablet 10 mg  10 mg Oral Daily PRN Reggie PileJoshi, Anand, MD      . pantoprazole (PROTONIX) EC tablet 40 mg  40 mg Oral Daily Reggie PileJoshi, Anand, MD       PTA Medications: Medications Prior to Admission  Medication Sig Dispense Refill Last Dose  . ibuprofen (ADVIL,MOTRIN)  600 MG tablet Take 1 tablet (600 mg total) by mouth every 6 (six) hours as needed. (Patient not taking: Reported on 05/04/2018) 60 tablet 3 Completed Course at Unknown time  . naproxen sodium (ALEVE) 220 MG tablet Take 220-440 mg by mouth 2 (two) times daily as needed (pain).   Unknown at PRN    Patient Stressors: Financial difficulties Substance abuse  Patient Strengths: Average or above average intelligence General fund of knowledge  Treatment Modalities: Medication Management, Group therapy, Case management,  1 to 1 session with clinician, Psychoeducation, Recreational therapy.   Physician Treatment Plan for Primary Diagnosis: <principal problem not specified> Long Term Goal(s): Improvement in symptoms so as ready for discharge Improvement in symptoms so as ready for discharge   Short Term Goals: Ability to demonstrate self-control will improve Ability to demonstrate self-control will improve  Medication Management: Evaluate patient's response, side effects, and tolerance of medication regimen.  Therapeutic Interventions: 1 to 1 sessions, Unit Group sessions and Medication administration.  Evaluation of Outcomes: Progressing  Physician Treatment Plan for Secondary Diagnosis: Active Problems:   MDD (major depressive disorder)  Long Term Goal(s): Improvement in symptoms so as ready for discharge Improvement in symptoms so as ready for discharge   Short Term Goals: Ability to  demonstrate self-control will improve Ability to demonstrate self-control will improve     Medication Management: Evaluate patient's response, side effects, and tolerance of medication regimen.  Therapeutic Interventions: 1 to 1 sessions, Unit Group sessions and Medication administration.  Evaluation of Outcomes: Progressing   RN Treatment Plan for Primary Diagnosis: <principal problem not specified> Long Term Goal(s): Knowledge of disease and therapeutic regimen to maintain health will  improve  Short Term Goals: Ability to remain free from injury will improve, Ability to demonstrate self-control, Ability to participate in decision making will improve and Ability to identify and develop effective coping behaviors will improve  Medication Management: RN will administer medications as ordered by provider, will assess and evaluate patient's response and provide education to patient for prescribed medication. RN will report any adverse and/or side effects to prescribing provider.  Therapeutic Interventions: 1 on 1 counseling sessions, Psychoeducation, Medication administration, Evaluate responses to treatment, Monitor vital signs and CBGs as ordered, Perform/monitor CIWA, COWS, AIMS and Fall Risk screenings as ordered, Perform wound care treatments as ordered.  Evaluation of Outcomes: Progressing   LCSW Treatment Plan for Primary Diagnosis: <principal problem not specified> Long Term Goal(s): Safe transition to appropriate next level of care at discharge, Engage patient in therapeutic group addressing interpersonal concerns.  Short Term Goals: Engage patient in aftercare planning with referrals and resources, Increase emotional regulation, Facilitate acceptance of mental health diagnosis and concerns and Increase skills for wellness and recovery  Therapeutic Interventions: Assess for all discharge needs, 1 to 1 time with Social worker, Explore available resources and support systems, Assess for adequacy in community support network, Educate family and significant other(s) on suicide prevention, Complete Psychosocial Assessment, Interpersonal group therapy.  Evaluation of Outcomes: Progressing   Progress in Treatment: Attending groups: Yes. Participating in groups: Yes. Taking medication as prescribed: Yes. Toleration medication: Yes. Family/Significant other contact made: No, will contact:  Pt declined collateral contact Patient understands diagnosis: Yes. Discussing patient  identified problems/goals with staff: Yes. Medical problems stabilized or resolved: Yes. Denies suicidal/homicidal ideation: Yes. Issues/concerns per patient self-inventory: No. Other: N/A  New problem(s) identified: No, Describe:  none  New Short Term/Long Term Goal(s):  medication management for mood stabilization; elimination of SI thoughts; development of comprehensive mental wellness/sobriety plan.   Patient Goals:  "To get better and go home and be more of a mommy to my kids"  Discharge Plan or Barriers: SPE pamphlet, Mobile Crisis information, and AA/NA information provided to patient for additional community support and resources. Pt has an appointment with [facility] on 05/10/2018 at 12:30PM.  Reason for Continuation of Hospitalization: Medication stabilization  Estimated Length of Stay: Tomorrow 05/07/2018  Recreational Therapy: Patient Stressors: N/A Patient Goal: Patient will identify 3 positive coping skills strategies to use post d/c within 5 recreation therapy group sessions  Attendees: Patient: Holly Nicholson 05/06/2018 2:59 PM  Physician: Dr Toni Amend MD 05/06/2018 2:59 PM  Nursing:  05/06/2018 2:59 PM  RN Care Manager: 05/06/2018 2:59 PM  Social Worker: Zollie Scale Moton LCSW 05/06/2018 2:59 PM  Recreational Therapist: Garret Reddish CTRS LRT 05/06/2018 2:59 PM  Other: Penni Homans LCSW 05/06/2018 2:59 PM  Other: Lowella Dandy LCSW 05/06/2018 2:59 PM  Other: 05/06/2018 2:59 PM    Scribe for Treatment Team: Charlann Lange Moton, LCSW 05/06/2018 2:59 PM

## 2018-05-06 NOTE — Plan of Care (Signed)
Pt. Endorses a normal mood. Pt. Complaint with medications and unit procedures. Pt. Denies si/hi/avh, able to contract for safety.    Problem: Coping: Goal: Coping ability will improve Outcome: Progressing Goal: Will verbalize feelings Outcome: Progressing   Problem: Health Behavior/Discharge Planning: Goal: Compliance with therapeutic regimen will improve Outcome: Progressing   Problem: Self-Concept: Goal: Will verbalize positive feelings about self Outcome: Progressing

## 2018-05-06 NOTE — Progress Notes (Signed)
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, drive)  Frequency of Recreation/Participation: Weekly  Awareness of Community Resources:  Yes  Community Resources:  [place], [employer]  Current Use:    If no, Barriers?:    Expressed Interest in [employer] Information:    [place] of Residence:  Film/video editor  Patient Main Form of Transportation: Set designer  Patient Strengths:  Mudlogger and helping  Patient Identified Areas of Improvement:  Stop holding everything in  Patient Goal for Hospitalization:  Go home and do better and be open  Current SI (including self-harm):  No  Current HI:  No  Current AVH: No  Staff Intervention Plan: Group Attendance, Collaborate with Interdisciplinary Treatment Team  Consent to Intern Participation: N/A  Holly Nicholson 05/06/2018, 2:21 PM

## 2018-05-06 NOTE — [facility] Group Notes (Signed)
Overcoming Obstacles  05/06/2018 1PM  Type of Therapy and Topic:  Group Therapy:  Overcoming Obstacles  Participation Level:  Active    Description of Group:    In this group patients will be encouraged to explore what they see as obstacles to their own wellness and recovery. They will be guided to discuss their thoughts, feelings, and behaviors related to these obstacles. The group will process together ways to cope with barriers, with attention given to specific choices patients can make. Each patient will be challenged to identify changes they are motivated to make in order to overcome their obstacles. This group will be process-oriented, with patients participating in exploration of their own experiences as well as giving and receiving support and challenge from other group members.   Therapeutic Goals: 1. Patient will identify personal and current obstacles as they relate to admission. 2. Patient will identify barriers that currently interfere with their wellness or overcoming obstacles.  3. Patient will identify feelings, thought process and behaviors related to these barriers. 4. Patient will identify two changes they are willing to make to overcome these obstacles:      Summary of Patient Progress The pt came to the group late today. The pt discussed her challenges with overcoming drug addiction. The pt actively and appropriately interacted with the other group member and was receptive to receiving feedback. The pt identified having a strong support system as a healthy way to overcome an obstacle. The pt demonstrated insight and respected boundaries during session.    Therapeutic Modalities:   Cognitive Behavioral Therapy Solution Focused Therapy Motivational Interviewing Relapse Prevention Therapy    Holly Nicholson, MSW, LCSW 05/06/2018 2:03 PM

## 2018-05-06 NOTE — Progress Notes (Signed)
D: Pt during assessments denies SI/HI/AVH, able to contract for safety. Pt is mostly pleasant and cooperative, engages appropriate. Pt. Denies anxiety and depression. Pt. Endorses a normal mood. Pt. Reports she is doing better since she was admitted.   A: Q x 15 minute observation checks were completed for safety. Patient was provided with education. Patient was given/offered medications per orders. Patient  was encourage to attend groups, participate in unit activities and continue with plan of care. Pt. Chart and plans of care reviewed. Pt. Given support and encouragement.   R: Patient is complaint with medications and unit procedures. Pt. Interactions are appropriate with staff and peers. Pt. Attends snack time, observed eating good. Pt. Denies pain. Pt. covid screened. Pt. Observed frequently socializing in the hallways and in the milieu with peers.             Precautionary checks every 15 minutes for safety maintained, room free of safety hazards, patient sustains no injury or falls during this shift. Will endorse care to next shift.

## 2018-05-06 NOTE — Progress Notes (Addendum)
Recreation Therapy Notes   Date: 05/06/2018  Time: 9:30 am  Location: Craft Room  Behavioral response: Appropriate  Intervention Topic: Anger Management  Discussion/Intervention:  Group content on today was focused on anger management. The group defined anger and reasons they become angry. Individuals expressed negative way they have dealt with anger in the past. Patients stated some positive ways they could deal with anger in the future. The group described how anger can affect your health and daily plans. Individuals participated in the intervention "Score your anger" where they had a chance to answer questions about themselves and get a score of their anger.  Clinical Observations/Feedback:  Patient came to group and stated when she is angry she gets quiet and her ears turn red. She explained that she uses music and meditation to manage her anger. Individual was social with peers and staff while participating in the intervention. [name] LRT/CTRS         Holly Nicholson 05/06/2018 10:58 AM

## 2018-05-06 NOTE — Plan of Care (Signed)
Patient alert and oriented x 3 and is ambulatory on the unit. Complained of a headache this morning, prn Naproxen administered with some relief. Present in the milieu with select interaction noted. Compliant with medication and meals. Denies SI/HI and AVH at this time. Milieu remains safe with q 15 minute safety checks.

## 2018-05-06 NOTE — Progress Notes (Signed)
D: Pt denies SI/HI/AVH. Pt is pleasant and cooperative. Pt. hs no Complaints. Patient endorses a normal mood.   A: Q x 15 minute observation checks were completed for safety. Patient was provided with education. Patient was given/offered medications per orders. Patient  was encourage to attend groups, participate in unit activities and continue with plan of care. Pt. Chart and plans of care reviewed. Pt. Given support and encouragement.   R: Patient is complaint with medications and unit procedures. Pt. Attends snack time, eating good. Pt covid screened. Pt. interactions appropriate.             Precautionary checks every 15 minutes for safety maintained, room free of safety hazards, patient sustains no injury or falls during this shift. Will endorse care to next shift.

## 2018-05-07 DIAGNOSIS — F332 Major depressive disorder, recurrent severe without psychotic features: Secondary | ICD-10-CM

## 2018-05-07 NOTE — Discharge Summary (Signed)
Physician Discharge Summary Note  Patient:  Holly Nicholson 33 y.o., female MRN:  [id number] DOB:  Dec 18, 1985 Patient phone:  [phone] (home)  Patient address:   [address] [place] [address],  Total Time spent with patient: 45 minutes  Date of Admission:  05/04/2018 Date of Discharge: May 07, 2018  Reason for Admission: Admitted because of overdose on Xanax with passive suicidal thoughts  Principal Problem: MDD (major depressive disorder) Discharge Diagnoses: Principal Problem:   MDD (major depressive disorder) Active Problems:   Generalized anxiety disorder   Past Psychiatric History: History of chronic anxiety and depression  Past Medical History:  Past Medical History:  Diagnosis Date  . Anxiety   . BMI 45.0-49.9, adult (HCC)   . Depression   . Elevated blood pressure   . GERD (gastroesophageal reflux disease)   . History of bilateral tubal ligation 2007  . History of bilateral tubal ligation 2007  . Hypertension   . Low-lying placenta   . Lower extremity edema   . Tubal pregnancy Aug 2016    Past Surgical History:  Procedure Laterality Date  . CHOLECYSTECTOMY N/A 09/02/2015   Procedure: LAPAROSCOPIC CHOLECYSTECTOMY;  Surgeon: Leafy Ro, MD;  Location: [facility] ORS;  Service: General;  Laterality: N/A;  . KNEE SURGERY    . LAPAROSCOPIC HYSTERECTOMY Bilateral 10/30/2017   Procedure: HYSTERECTOMY TOTAL LAPAROSCOPIC-BILATERAL SALPINGECTOMY;  Surgeon: Vena Austria, MD;  Location: [facility] ORS;  Service: Gynecology;  Laterality: Bilateral;  . LAPAROSCOPIC SALPINGO OOPHERECTOMY N/A 10/19/2016   Procedure: LAPAROSCOPIC SALPINGECTOMY bilateral;  Surgeon: Nadara Mustard, MD;  Location: [facility] ORS;  Service: Gynecology;  Laterality: N/A;  . TUBAL LIGATION    . Tubal reanastamosis     Family History:  Family History  Problem Relation Age of Onset  . Heart disease Mother   . Hyperlipidemia Mother   . Hypertension Mother   . Depression Mother    . Anxiety disorder Mother   . Diabetes Father   . Heart disease Father   . Hyperlipidemia Father   . Hypertension Father   . Stroke Father   . Cancer Maternal Grandfather        lung  . Cancer Paternal Grandmother        stomach  . Epilepsy Daughter   . Asthma Son   . Stroke Paternal Grandfather   . Depression Maternal Aunt   . Anxiety disorder Maternal Aunt    Family Psychiatric  History: Positive for anxiety and depression Social History:  Social History   Substance and Sexual Activity  Alcohol Use Not Currently   Comment: occ.     Social History   Substance and Sexual Activity  Drug Use No    Social History   Socioeconomic History  . Marital status: Divorced    Spouse name: Not on file  . Number of children: 4  . Years of education: Not on file  . Highest education level: Some college, no degree  Occupational History  . Not on file  Social Needs  . Financial resource strain: Somewhat hard  . Food insecurity:    Worry: Sometimes true    Inability: Sometimes true  . Transportation needs:    Medical: No    Non-medical: No  Tobacco Use  . Smoking status: Current Every Day Smoker    Packs/day: 1.50    Years: 2.00    Pack years: 3.00    Types: Cigarettes  . Smokeless tobacco: Never Used  Substance and  Sexual Activity  . Alcohol use: Not Currently    Comment: occ.  . Drug use: No  . Sexual activity: Yes    Birth control/protection: I.U.D., Surgical  Lifestyle  . Physical activity:    Days per week: 0 days    Minutes per session: 0 min  . Stress: Very much  Relationships  . Social connections:    Talks on phone: More than three times a week    Gets together: Never    Attends religious service: More than 4 times per year    Active member of club or organization: No    Attends meetings of clubs or organizations: Never    Relationship status: Divorced  Other Topics Concern  . Not on file  Social History Narrative  . Not on file    Hospital  Course: Patient admitted to the hospital.  Medically stabilized.  No need for detox treatment.  Engage with treatment team and providers in discussing depression symptoms.  Patient consistently denied suicidal intent or plan.  Patient had been started on Prozac and Geodon at the time of admission and tolerated these without difficulty.  Reviewed rationale for use of medication with patient.  She is agreeable to following up with [facility] and is tolerating medicine.  Prescriptions written for medications and the patient will follow-up with [facility].  At the time of discharge no indication of any acute dangerousness or suicidal ideation or psychosis  Physical Findings: AIMS:  , ,  ,  ,    CIWA:    COWS:     Musculoskeletal: Strength & Muscle Tone: within normal limits Gait & Station: normal Patient leans: N/A  Psychiatric Specialty Exam: Physical Exam  Nursing note and vitals reviewed. Constitutional: She appears well-developed and well-nourished.  HENT:  Head: Normocephalic and atraumatic.  Eyes: Pupils are equal, round, and reactive to light. Conjunctivae are normal.  Neck: Normal range of motion.  Cardiovascular: Regular rhythm and normal heart sounds.  Respiratory: Effort normal. No respiratory distress.  GI: Soft.  Musculoskeletal: Normal range of motion.  Neurological: She is alert.  Skin: Skin is warm and dry.  Psychiatric: She has a normal mood and affect. Her speech is normal and behavior is normal. Judgment and thought content normal. Cognition and memory are normal.    Review of Systems  Constitutional: Negative.   HENT: Negative.   Eyes: Negative.   Respiratory: Negative.   Cardiovascular: Negative.   Gastrointestinal: Negative.   Musculoskeletal: Negative.   Skin: Negative.   Neurological: Negative.   Psychiatric/Behavioral: Negative.     Blood pressure 116/74, pulse 69, temperature 98.6 F (37 C), temperature source Oral, resp. rate 18, height 5\' 2"  (1.575 m), weight 104.3  kg, last menstrual period 08/13/2017, SpO2 100 %, not currently breastfeeding.Body mass index is 42.07 kg/m.  General Appearance: Casual  Eye Contact:  Good  Speech:  Clear and Coherent  Volume:  Normal  Mood:  Euthymic  Affect:  Constricted  Thought Process:  Coherent and Goal Directed  Orientation:  Full (Time, Place, and Person)  Thought Content:  Logical  Suicidal Thoughts:  No  Homicidal Thoughts:  No  Memory:  Immediate;   Fair Recent;   Fair Remote;   Fair  Judgement:  Fair  Insight:  Fair  Psychomotor Activity:  Normal  Concentration:  Concentration: Fair  Recall:  [employer] of Knowledge:  Fair  Language:  Fair  Akathisia:  No  Handed:  Right  AIMS (if indicated):  Assets:  Desire for Improvement Housing Physical Health  ADL's:  Intact  Cognition:  WNL  Sleep:  Number of Hours: 6.45     Have you used any form of tobacco in the last 30 days? (Cigarettes, Smokeless Tobacco, Cigars, and/or Pipes): Yes  Has this patient used any form of tobacco in the last 30 days? (Cigarettes, Smokeless Tobacco, Cigars, and/or Pipes) Yes, Yes, A prescription for an FDA-approved tobacco cessation medication was offered at discharge and the patient refused  Blood Alcohol level:  Lab Results  Component Value Date   ETH <10 05/04/2018    Metabolic Disorder Labs:  Lab Results  Component Value Date   HGBA1C 5.0 05/06/2018   MPG 96.8 05/06/2018   No results found for: PROLACTIN Lab Results  Component Value Date   CHOL 133 05/06/2018   TRIG 245 (H) 05/06/2018   HDL 31 (L) 05/06/2018   CHOLHDL 4.3 05/06/2018   VLDL 49 (H) 05/06/2018   LDLCALC 53 05/06/2018   LDLCALC 81 08/20/2014    See Psychiatric Specialty Exam and Suicide Risk Assessment completed by Attending Physician prior to discharge.  Discharge destination:  Home  Is patient on multiple antipsychotic therapies at discharge:  No   Has Patient had three or more failed trials of antipsychotic monotherapy by  history:  No  Recommended Plan for Multiple Antipsychotic Therapies: NA  Discharge Instructions    Diet - low sodium heart healthy   Complete by:  As directed    Increase activity slowly   Complete by:  As directed      Allergies as of 05/07/2018      Reactions   Onion Anaphylaxis, Swelling   Raw onions causes throat to swell   Latex Dermatitis      Medication List    STOP taking these medications   ibuprofen 600 MG tablet Commonly known as:  ADVIL   naproxen sodium 220 MG tablet Commonly known as:  ALEVE     TAKE these medications     Indication  FLUoxetine 10 MG capsule Commonly known as:  PROZAC Take 1 capsule (10 mg total) by mouth daily.  Indication:  Depression   traZODone 50 MG tablet Commonly known as:  DESYREL Take 1 tablet (50 mg total) by mouth at bedtime.  Indication:  Trouble Sleeping   ziprasidone 20 MG capsule Commonly known as:  GEODON Take 1 capsule (20 mg total) by mouth 2 (two) times daily with a meal.  Indication:  Manic-Depression      Follow-up Information    [facility] Follow up on 05/10/2018.   Why:  Please follow up with [facility] on Friday, May 1st at 12:30pm. Please take your ID, insurance card and hospital discharge paperwork with you to your appointment. Thank you. Contact information: [address] [place] [address] [phone]           Follow-up recommendations:  Activity:  Activity as tolerated Diet:  Regular diet Other:  Follow-up outpatient treatment with [facility]  Comments: Patient appears to be calm and not meeting commitment criteria stable in her behavior.  Discharge with plans to follow-up with [facility] continue current medicine for now.  Signed: Mordecai RasmussenJohn Clapacs, MD 05/07/2018, 4:57 PM

## 2018-05-07 NOTE — Progress Notes (Signed)
Recreation Therapy Notes  INPATIENT RECREATION TR PLAN  Patient Details Name: Holly Nicholson MRN: [id number] DOB: 12-18-1985 Today's Date: 05/07/2018  Rec Therapy Plan Is patient appropriate for Therapeutic Recreation?: Yes Treatment times per week: at least 3 Estimated Length of Stay: 5-7 days TR Treatment/Interventions: Group participation (Comment)  Discharge Criteria Pt will be discharged from therapy if:: Discharged Treatment plan/goals/alternatives discussed and agreed upon by:: Patient/family  Discharge Summary Short term goals set: Patient will identify 3 positive coping skills strategies to use post d/c within 5 recreation therapy group sessions Short term goals met: Adequate for discharge Progress toward goals comments: Groups attended Which groups?: Anger management, Other (Comment)(Values) Reason goals not met: N/A Therapeutic equipment acquired: N/A Reason patient discharged from therapy: Discharge from hospital Pt/family agrees with progress & goals achieved: Yes Date patient discharged from therapy: 05/07/18   Zetta Stoneman 05/07/2018, 10:18 AM

## 2018-05-07 NOTE — Progress Notes (Signed)
D: Patient is aware of  Discharge this shift .Patient denies suicidal /homicidal ideations. Patient received all belongings brought in  A: No Storage medications. Writer reviewed Discharge Summary, Suicide Risk Assessment, and Transitional Record. Patient also received Prescriptions   from  MD.  Aware  Of follow up appointment . R: Patient left unit with no questions  Or concerns  With group home staff.  

## 2018-05-07 NOTE — Progress Notes (Signed)
Recreation Therapy Notes   Date: 05/07/2018  Time: 9:30 am  Location: Craft Room  Behavioral response: Appropriate  Intervention Topic: Values  Discussion/Intervention:  Group content today was focused on values. The group identified what values are and where they come from. Individuals expressed some values and how many they have. Patients described how they go about add or removing values. The group described the importance of having values and how they go about using them in daily life. Patient participated in the intervention "My Values" where they were able to pick out values that were important to them and make a visual aide.  Clinical Observations/Feedback:  Patient came to group late due to unknown reasons.  Individual was social with peers and staff while participating in the intervention. Pearlie Nies LRT/CTRS         Dorrine Montone 05/07/2018 10:17 AM

## 2018-05-07 NOTE — Progress Notes (Signed)
  [facility] Adult Case Management Discharge Plan :  Will you be returning to the same living situation after discharge:  Yes,  home At discharge, do you have transportation home?: Yes,  family Do you have the ability to pay for your medications: Yes,  BCBS insurance  Release of information consent forms completed and in the chart;    Patient to Follow up at: Follow-up Information    [facility] Follow up on 05/10/2018.   Why:  Please follow up with [facility] on Friday, May 1st at 12:30pm. Please take your ID, insurance card and hospital discharge paperwork with you to your appointment. Thank you. Contact information: [address] [place] [address] [phone]           Next level of care provider has access to [facility] Link:no  Safety Planning and Suicide Prevention discussed: Yes,  SPE completed with pt as pt declined collateral contact  Have you used any form of tobacco in the last 30 days? (Cigarettes, Smokeless Tobacco, Cigars, and/or Pipes): Yes  Has patient been referred to the Quitline?: Patient refused referral  Patient has been referred for addiction treatment: N/A  Mechele Dawley, LCSW 05/07/2018, 8:49 AM

## 2018-05-08 ENCOUNTER — Ambulatory Visit (INDEPENDENT_AMBULATORY_CARE_PROVIDER_SITE_OTHER): Payer: BLUE CROSS/BLUE SHIELD | Admitting: Family Medicine

## 2018-05-08 ENCOUNTER — Encounter: Payer: Self-pay | Admitting: Family Medicine

## 2018-05-08 ENCOUNTER — Other Ambulatory Visit: Payer: Self-pay

## 2018-05-08 DIAGNOSIS — F411 Generalized anxiety disorder: Secondary | ICD-10-CM | POA: Diagnosis not present

## 2018-05-08 DIAGNOSIS — F332 Major depressive disorder, recurrent severe without psychotic features: Secondary | ICD-10-CM | POA: Diagnosis not present

## 2018-05-08 NOTE — Progress Notes (Signed)
LMP 08/13/2017 (Exact Date)    Subjective:    Patient ID: Holly Nicholson, female    DOB: 30-Nov-1985, 33 y.o.   MRN: [id number]  HPI: Holly Nicholson is a 33 y.o. female  No chief complaint on file.  Telemedicine using audio/video telecommunications for a synchronous communication visit. Today's visit due to COVID-19 isolation precautions I connected with and verified that I am speaking with the correct person using two identifiers.   I discussed the limitations, risks, security and privacy concerns of performing an evaluation and management service by telecommunication and the availability of in person appointments. I also discussed with the patient that there may be a patient responsible charge related to this service. The patient expressed understanding and agreed to proceed. The patient's location is home. I am at home.  Discussed with patient medications.  Patient's been able to get her medications without any problems.  Taking them without side effects and is feeling significantly better. Has significant insight into her problems. Has no transportation issues getting to [facility] on the first. Has support systems in place. Transition of Care Hospital Follow up.   Hospital/Facility:[facility] D/C Physician: Dr Toni Amend D/C Date: 05-07-18  Records Requested: na Records Received:  na Records Reviewed: today  Diagnoses on Discharge: depression/anxiety  Date of interactive Contact within 48 hours of discharge: 05-08-18 Contact was through: Telemedicine using audio/video telecommunications for a synchronous communication visit.  Date of 7 day or 14 day face-to-face visit:    05-08-18  Outpatient Encounter Medications as of 05/08/2018  Medication Sig  . FLUoxetine (PROZAC) 10 MG capsule Take 1 capsule (10 mg total) by mouth daily.  . traZODone (DESYREL) 50 MG tablet Take 1 tablet (50 mg total) by mouth at bedtime.  . ziprasidone (GEODON) 20 MG capsule Take 1 capsule (20 mg total) by  mouth 2 (two) times daily with a meal.   Facility-Administered Encounter Medications as of 05/08/2018  Medication  . loratadine (CLARITIN) tablet 10 mg  . pantoprazole (PROTONIX) EC tablet 40 mg    Diagnostic Tests Reviewed/Disposition:  na  Consults:na  Discharge Instructions reviewed  Disease/illness Education:done  Home Health/Community Services Discussions/Referrals:na  Establishment or re-establishment of referral orders for community resources:na  Discussion with other health care providers: na  Assessment and Support of treatment regimen adherence:done  Appointments Coordinated with:  na  Education for self-management, independent living, and ADLs: na  Relevant past medical, surgical, family and social history reviewed and updated as indicated. Interim medical history since our last visit reviewed. Allergies and medications reviewed and updated.  Review of Systems  Constitutional: Negative.   Respiratory: Negative.   Cardiovascular: Negative.     Per HPI unless specifically indicated above     Objective:    LMP 08/13/2017 (Exact Date)   Wt Readings from Last 3 Encounters:  05/04/18 230 lb (104.3 kg)  05/03/18 235 lb (106.6 kg)  03/17/18 238 lb (108 kg)    Physical Exam  Results for orders placed or performed during the hospital encounter of 05/04/18  Hemoglobin A1c  Result Value Ref Range   Hgb A1c MFr Bld 5.0 4.8 - 5.6 %   Mean Plasma Glucose 96.8 mg/dL  Lipid panel  Result Value Ref Range   Cholesterol 133 0 - 200 mg/dL   Triglycerides 854 (H) <150 mg/dL   HDL 31 (L) >62 mg/dL   Total CHOL/HDL Ratio 4.3 RATIO   VLDL 49 (H) 0 - 40 mg/dL   LDL Cholesterol 53 0 - 99  mg/dL      Assessment & Plan:   Problem List Items Addressed This Visit      Other   Generalized anxiety disorder    See depression notes      Depression    Discussed nerve and medications with patient patient is gotten medications are very reasonable price and will continue  taking them has no transportation issues getting to [facility].        I discussed the assessment and treatment plan with the patient. The patient was provided an opportunity to ask questions and all were answered. The patient agreed with the plan and demonstrated an understanding of the instructions.   The patient was advised to call back or seek an in-person evaluation if the symptoms worsen or if the condition fails to improve as anticipated.   I provided 21+ minutes of time during this encounter.  Follow up plan: Return in about 4 weeks (around 06/05/2018), or if symptoms worsen or fail to improve.

## 2018-05-08 NOTE — Assessment & Plan Note (Signed)
Discussed nerve and medications with patient patient is gotten medications are very reasonable price and will continue taking them has no transportation issues getting to [facility].

## 2018-05-08 NOTE — Assessment & Plan Note (Signed)
See depression notes

## 2018-05-10 DIAGNOSIS — F3181 Bipolar II disorder: Secondary | ICD-10-CM | POA: Diagnosis not present

## 2018-06-04 DIAGNOSIS — F3181 Bipolar II disorder: Secondary | ICD-10-CM | POA: Diagnosis not present

## 2018-06-25 ENCOUNTER — Other Ambulatory Visit: Payer: Self-pay | Admitting: Psychiatry

## 2018-08-06 ENCOUNTER — Telehealth: Payer: Self-pay | Admitting: Pharmacy Technician

## 2018-08-06 NOTE — Telephone Encounter (Signed)
Automated system with [employer] indicated that policy: [id number] ended on Jun 09, 2018.  [facility]

## 2018-12-02 ENCOUNTER — Encounter: Payer: Self-pay | Admitting: Emergency Medicine

## 2018-12-02 ENCOUNTER — Emergency Department: Payer: Self-pay

## 2018-12-02 ENCOUNTER — Other Ambulatory Visit: Payer: Self-pay

## 2018-12-02 ENCOUNTER — Emergency Department
Admission: EM | Admit: 2018-12-02 | Discharge: 2018-12-02 | Disposition: A | Discharge status: Home / Self Care | Payer: Self-pay | Attending: Emergency Medicine | Admitting: Emergency Medicine

## 2018-12-02 DIAGNOSIS — G43819 Other migraine, intractable, without status migrainosus: Secondary | ICD-10-CM | POA: Insufficient documentation

## 2018-12-02 DIAGNOSIS — I10 Essential (primary) hypertension: Secondary | ICD-10-CM | POA: Insufficient documentation

## 2018-12-02 DIAGNOSIS — Z79899 Other long term (current) drug therapy: Secondary | ICD-10-CM | POA: Insufficient documentation

## 2018-12-02 DIAGNOSIS — F17210 Nicotine dependence, cigarettes, uncomplicated: Secondary | ICD-10-CM | POA: Insufficient documentation

## 2018-12-02 DIAGNOSIS — G43809 Other migraine, not intractable, without status migrainosus: Secondary | ICD-10-CM | POA: Diagnosis not present

## 2018-12-02 LAB — CBC
HCT: 40.4 % (ref 36.0–46.0)
Hemoglobin: 14.4 g/dL (ref 12.0–15.0)
MCH: 29.9 pg (ref 26.0–34.0)
MCHC: 35.6 g/dL (ref 30.0–36.0)
MCV: 83.8 fL (ref 80.0–100.0)
Platelets: 142 10*3/uL — ABNORMAL LOW (ref 150–400)
RBC: 4.82 MIL/uL (ref 3.87–5.11)
RDW: 12.8 % (ref 11.5–15.5)
WBC: 3.7 10*3/uL — ABNORMAL LOW (ref 4.0–10.5)
nRBC: 0 % (ref 0.0–0.2)

## 2018-12-02 LAB — COMPREHENSIVE METABOLIC PANEL
ALT: 30 U/L (ref 0–44)
AST: 25 U/L (ref 15–41)
Albumin: 4.6 g/dL (ref 3.5–5.0)
Alkaline Phosphatase: 99 U/L (ref 38–126)
Anion gap: 9 (ref 5–15)
BUN: 9 mg/dL (ref 6–20)
CO2: 23 mmol/L (ref 22–32)
Calcium: 8.9 mg/dL (ref 8.9–10.3)
Chloride: 104 mmol/L (ref 98–111)
Creatinine, Ser: 0.81 mg/dL (ref 0.44–1.00)
GFR calc Af Amer: >60 mL/min (ref >60)
GFR calc non Af Amer: >60 mL/min (ref >60)
Glucose, Bld: 109 mg/dL — ABNORMAL HIGH (ref 70–99)
Potassium: 3.8 mmol/L (ref 3.5–5.1)
Sodium: 136 mmol/L (ref 135–145)
Total Bilirubin: 1 mg/dL (ref 0.3–1.2)
Total Protein: 7.7 g/dL (ref 6.5–8.1)

## 2018-12-02 LAB — DIFFERENTIAL
Abs Immature Granulocytes: 0.01 10*3/uL (ref 0.00–0.07)
Basophils Absolute: 0 10*3/uL (ref 0.0–0.1)
Basophils Relative: 0 %
Eosinophils Absolute: 0 10*3/uL (ref 0.0–0.5)
Eosinophils Relative: 1 %
Immature Granulocytes: 0 %
Lymphocytes Relative: 22 %
Lymphs Abs: 0.8 10*3/uL (ref 0.7–4.0)
Monocytes Absolute: 0.3 10*3/uL (ref 0.1–1.0)
Monocytes Relative: 8 %
Neutro Abs: 2.6 10*3/uL (ref 1.7–7.7)
Neutrophils Relative %: 69 %

## 2018-12-02 MED ORDER — METOCLOPRAMIDE HCL 5 MG/ML IJ SOLN
10.0000 mg | Freq: Once | INTRAMUSCULAR | Status: AC
  Administered 2018-12-02: 10 mg via INTRAVENOUS
  Filled 2018-12-02: qty 2

## 2018-12-02 MED ORDER — DIPHENHYDRAMINE HCL 50 MG/ML IJ SOLN
12.5000 mg | Freq: Once | INTRAMUSCULAR | Status: AC
  Administered 2018-12-02: 12.5 mg via INTRAVENOUS
  Filled 2018-12-02: qty 1

## 2018-12-02 MED ORDER — SODIUM CHLORIDE 0.9 % IV BOLUS
1000.0000 mL | Freq: Once | INTRAVENOUS | Status: AC
  Administered 2018-12-02: 1000 mL via INTRAVENOUS

## 2018-12-02 MED ORDER — ACETAMINOPHEN 500 MG PO TABS
1000.0000 mg | ORAL_TABLET | Freq: Once | ORAL | Status: AC
  Administered 2018-12-02: 1000 mg via ORAL
  Filled 2018-12-02: qty 2

## 2018-12-02 NOTE — Discharge Instructions (Signed)
Your CT head was negative.  You take ibuprofen 600 every 8 hours and Tylenol 1 g every 8 hours as needed.  Return to the ER for any other concerns.

## 2018-12-02 NOTE — ED Triage Notes (Signed)
Pt reports blurred vision, weakness and dizziness for 2 hours.

## 2018-12-02 NOTE — ED Provider Notes (Signed)
[facility] Emergency Department Provider Note  ____________________________________________   First MD Initiated Contact with Patient 12/02/18 1208     (approximate)  I have reviewed the triage vital signs and the nursing notes.   HISTORY  Chief Complaint Dizziness, Weakness, and Blurred Vision    HPI Holly Nicholson is a 33 y.o. female with depression, hypertension, prior tubal pregnancy who presents with weakness, blurred vision and dizziness that started about 4 hours ago.  Patient has had a history of migraines in the past but is never seen a doctor for them.  This headache is similar in character but seems stronger.  Is not sudden and strongest in onset.  Is been gradually getting worse over the past few hours.  It is associated with feeling lightheaded and she is going to pass out.  Her migraine is moderate, constant, gradually worsening, has not take anything to help make it feel better, nothing seems to make it worse.  Associated with generalized weakness and some blurred vision.  Felt fine prior to the headache starting.  Denies any fevers or neck stiffness.          Past Medical History:  Diagnosis Date   Anxiety    BMI 45.0-49.9, adult (HCC)    Depression    Elevated blood pressure    GERD (gastroesophageal reflux disease)    History of bilateral tubal ligation 2007   History of bilateral tubal ligation 2007   Hypertension    Low-lying placenta    Lower extremity edema    Tubal pregnancy Aug 2016    Patient Active Problem List   Diagnosis Date Noted   MDD (major depressive disorder) 05/04/2018   S/P laparoscopic hysterectomy 10/30/2017   Depression 12/25/2016   Female sterility 11/01/2016   BMI 45.0-49.9, adult (HCC) 04/27/2016   Influenza B 04/13/2016   Biliary colic 09/01/2015   Cholelithiasis without cholecystitis    Swelling of both ankles 07/21/2015   Morbid obesity (HCC) 09/06/2014   Vitamin D  deficiency 09/04/2014   Foot pain, right 09/04/2014   Generalized anxiety disorder 08/20/2014   Emesis 08/20/2014   Bipolar affective disorder, currently depressed, moderate (HCC) 08/20/2014   Other fatigue 08/20/2014   Tobacco abuse 08/20/2014   Preventative health care 08/20/2014   Transient elevated blood pressure 08/20/2014   Encounter for sterilization 08/20/2014    Past Surgical History:  Procedure Laterality Date   CHOLECYSTECTOMY N/A 09/02/2015   Procedure: LAPAROSCOPIC CHOLECYSTECTOMY;  Surgeon: Leafy Ro, MD;  Location: [facility] ORS;  Service: General;  Laterality: N/A;   KNEE SURGERY     LAPAROSCOPIC HYSTERECTOMY Bilateral 10/30/2017   Procedure: HYSTERECTOMY TOTAL LAPAROSCOPIC-BILATERAL SALPINGECTOMY;  Surgeon: Vena Austria, MD;  Location: [facility] ORS;  Service: Gynecology;  Laterality: Bilateral;   LAPAROSCOPIC SALPINGO OOPHERECTOMY N/A 10/19/2016   Procedure: LAPAROSCOPIC SALPINGECTOMY bilateral;  Surgeon: Nadara Mustard, MD;  Location: [facility] ORS;  Service: Gynecology;  Laterality: N/A;   TUBAL LIGATION     Tubal reanastamosis      Prior to Admission medications   Medication Sig Start Date End Date Taking? Authorizing Provider  FLUoxetine (PROZAC) 10 MG capsule TAKE 1 CAPSULE BY MOUTH DAILY 06/27/18   Clapacs, Jackquline Denmark, MD  traZODone (DESYREL) 50 MG tablet Take 1 tablet (50 mg total) by mouth at bedtime. 05/06/18   Clapacs, Jackquline Denmark, MD  ziprasidone (GEODON) 20 MG capsule Take 1 capsule (20 mg total) by mouth 2 (two) times daily with a meal. 05/06/18   Clapacs, Jonny Ruiz  T, MD    Allergies Onion and Latex  Family History  Problem Relation Age of Onset   Heart disease Mother    Hyperlipidemia Mother    Hypertension Mother    Depression Mother    Anxiety disorder Mother    Diabetes Father    Heart disease Father    Hyperlipidemia Father    Hypertension Father    Stroke Father    Cancer Maternal Grandfather        lung   Cancer Paternal  Grandmother        stomach   Epilepsy Daughter    Asthma Son    Stroke Paternal Grandfather    Depression Maternal Aunt    Anxiety disorder Maternal Aunt     Social History Social History   Tobacco Use   Smoking status: Current Every Day Smoker    Packs/day: 1.50    Years: 2.00    Pack years: 3.00    Types: Cigarettes   Smokeless tobacco: Never Used  Substance Use Topics   Alcohol use: Not Currently    Comment: occ.   Drug use: No      Review of Systems Constitutional: No fever/chills Eyes: Blurry vision ENT: No sore throat. Cardiovascular: Denies chest pain. Respiratory: Denies shortness of breath. Gastrointestinal: No abdominal pain.  No nausea, no vomiting.  No diarrhea.  No constipation. Genitourinary: Negative for dysuria. Musculoskeletal: Negative for back pain. Skin: Negative for rash. Neurological: Positive for headache and generalized weakness, focal weakness or numbness. All other ROS negative ____________________________________________   PHYSICAL EXAM:  VITAL SIGNS: ED Triage Vitals  Enc Vitals Group     BP 12/02/18 1040 131/77     Pulse Rate 12/02/18 1040 80     Resp 12/02/18 1040 20     Temp 12/02/18 1040 98.5 F (36.9 C)     Temp Source 12/02/18 1040 Oral     SpO2 12/02/18 1040 98 %     Weight 12/02/18 1041 230 lb (104.3 kg)     Height 12/02/18 1041 5\' 1"  (1.549 m)     Head Circumference --      Peak Flow --      Pain Score 12/02/18 1041 9     Pain Loc --      Pain Edu? --      Excl. in [place]? --     Constitutional: Alert and oriented. Well appearing and in no acute distress. Eyes: Conjunctivae are normal. EOMI. Head: Atraumatic. Nose: No congestion/rhinnorhea. Mouth/Throat: Mucous membranes are moist.   Neck: No stridor. Trachea Midline. FROM Cardiovascular: Normal rate, regular rhythm. Grossly normal heart sounds.  Good peripheral circulation. Respiratory: Normal respiratory effort.  No retractions. Lungs  CTAB. Gastrointestinal: Soft and nontender. No distention. No abdominal bruits.  Musculoskeletal: No lower extremity tenderness nor edema.  No joint effusions. Neurologic:  Normal speech and language.  Cranial nerves II through XII are intact.  Equal strength in arms or legs.  Patient able to ambulate without any evidence of ataxia Skin:  Skin is warm, dry and intact. No rash noted. Psychiatric: Mood and affect are normal. Speech and behavior are normal. GU: Deferred   ____________________________________________   LABS (all labs ordered are listed, but only abnormal results are displayed)  Labs Reviewed  CBC - Abnormal; Notable for the following components:      Result Value   WBC 3.7 (*)    Platelets 142 (*)    All other components within normal limits  COMPREHENSIVE METABOLIC PANEL -  Abnormal; Notable for the following components:   Glucose, Bld 109 (*)    All other components within normal limits  DIFFERENTIAL  PROTIME-INR  APTT  CBG MONITORING, ED  POC URINE PREG, ED   ____________________________________________   ED ECG REPORT I, Concha SeMary E Wash Nienhaus, the attending physician, personally viewed and interpreted this ECG.  EKG normal sinus elevation, no T wave inversion, normal intervals ____________________________________________  RADIOLOGY  Official radiology report(s): Ct Head Wo Contrast  Result Date: 12/02/2018 CLINICAL DATA:  33 year old female with neurologic deficit. Concern for stroke. EXAM: CT HEAD WITHOUT CONTRAST TECHNIQUE: Contiguous axial images were obtained from the base of the skull through the vertex without intravenous contrast. COMPARISON:  None. FINDINGS: Brain: The ventricles and sulci appropriate size for patient's age. The gray-white matter discrimination is preserved. There is no acute intracranial hemorrhage. No mass effect or midline shift. No extra-axial fluid collection. Vascular: No hyperdense vessel or unexpected calcification. Skull: Normal.  Negative for fracture or focal lesion. Sinuses/Orbits: No acute finding. Other: None IMPRESSION: Unremarkable noncontrast CT of the brain. Electronically Signed   By: Elgie CollardArash  Radparvar M.D.   On: 12/02/2018 11:18    ____________________________________________   PROCEDURES  Procedure(s) performed (including Critical Care):  Procedures   ____________________________________________   INITIAL IMPRESSION / ASSESSMENT AND PLAN / ED COURSE  Nino ParsleyLinda Riggs Adams was evaluated in Emergency Department on 12/02/2018 for the symptoms described in the history of present illness. She was evaluated in the context of the global COVID-19 pandemic, which necessitated consideration that the patient might be at risk for infection with the SARS-CoV-2 virus that causes COVID-19. Institutional protocols and algorithms that pertain to the evaluation of patients at risk for COVID-19 are in a state of rapid change based on information released by regulatory bodies including the CDC and federal and state organizations. These policies and algorithms were followed during the patient's care in the ED.    Patient presents with what sounds like a complex migraine.  CT scan was ordered in triage to rule out intracranial hemorrhage and given that was within 6 hours of onset of symptoms I have very low suspicion that this is secondary to subarachnoid hemorrhage missed on CT scan.  I discussed lumbar puncture with patient and she wanted to hold off given low suspicion.  She has had prior headaches in the past and this was not sudden and severe in onset.  No evidence of meningitis on examination.  No neuro deficits to suggest stroke.  Blurry vision is most likely secondary to be complex migraine.  Consider to pseudotumor cerebri but has not had these previously associated with blurry vision.  Will give migraine cocktail and reevaluate patient.  Patient declined pregnancy test given she had a hysterectomy.   White count slightly low  at 3.7.  But again no infectious symptoms.  Reevaluated patient after migraine cocktail.  Patient says her blurry vision is now resolved.  I offered to test her vision bilaterally and she declined saying that was at baseline.  Patient feels comfortable with going home at this time.  Will provide a work note for today.    I discussed the provisional nature of ED diagnosis, the treatment so far, the ongoing plan of care, follow up appointments and return precautions with the patient and any family or support people present. They expressed understanding and agreed with the plan, discharged home.    ____________________________________________   FINAL CLINICAL IMPRESSION(S) / ED DIAGNOSES   Final diagnoses:  Other migraine without status  migrainosus, intractable      MEDICATIONS GIVEN DURING THIS VISIT:  Medications  metoCLOPramide (REGLAN) injection 10 mg (10 mg Intravenous Given 12/02/18 1242)  diphenhydrAMINE (BENADRYL) injection 12.5 mg (12.5 mg Intravenous Given 12/02/18 1241)  acetaminophen (TYLENOL) tablet 1,000 mg (1,000 mg Oral Given 12/02/18 1243)  sodium chloride 0.9 % bolus 1,000 mL (1,000 mLs Intravenous New Bag/Given 12/02/18 1239)     ED Discharge Orders    None       Note:  This document was prepared using Dragon voice recognition software and may include unintentional dictation errors.   Concha Se, MD 12/02/18 1321

## 2018-12-02 NOTE — ED Notes (Signed)
Pt verbalizes understanding of d/c instructions and follow up. 

## 2018-12-09 ENCOUNTER — Emergency Department
Admission: EM | Admit: 2018-12-09 | Discharge: 2018-12-09 | Disposition: A | Discharge status: Home / Self Care | Payer: BC Managed Care – PPO | Attending: Emergency Medicine | Admitting: Emergency Medicine

## 2018-12-09 ENCOUNTER — Emergency Department: Payer: BC Managed Care – PPO

## 2018-12-09 ENCOUNTER — Other Ambulatory Visit: Payer: Self-pay

## 2018-12-09 ENCOUNTER — Encounter: Payer: Self-pay | Admitting: Emergency Medicine

## 2018-12-09 DIAGNOSIS — F17210 Nicotine dependence, cigarettes, uncomplicated: Secondary | ICD-10-CM | POA: Diagnosis not present

## 2018-12-09 DIAGNOSIS — I10 Essential (primary) hypertension: Secondary | ICD-10-CM | POA: Insufficient documentation

## 2018-12-09 DIAGNOSIS — R0689 Other abnormalities of breathing: Secondary | ICD-10-CM | POA: Diagnosis not present

## 2018-12-09 DIAGNOSIS — R064 Hyperventilation: Secondary | ICD-10-CM | POA: Diagnosis not present

## 2018-12-09 DIAGNOSIS — R42 Dizziness and giddiness: Secondary | ICD-10-CM | POA: Diagnosis not present

## 2018-12-09 DIAGNOSIS — N3090 Cystitis, unspecified without hematuria: Secondary | ICD-10-CM | POA: Diagnosis not present

## 2018-12-09 DIAGNOSIS — Z79899 Other long term (current) drug therapy: Secondary | ICD-10-CM | POA: Diagnosis not present

## 2018-12-09 DIAGNOSIS — R079 Chest pain, unspecified: Secondary | ICD-10-CM | POA: Insufficient documentation

## 2018-12-09 DIAGNOSIS — R0789 Other chest pain: Secondary | ICD-10-CM | POA: Diagnosis not present

## 2018-12-09 LAB — CBC
HCT: 37.7 % (ref 36.0–46.0)
Hemoglobin: 13.2 g/dL (ref 12.0–15.0)
MCH: 30.6 pg (ref 26.0–34.0)
MCHC: 35 g/dL (ref 30.0–36.0)
MCV: 87.3 fL (ref 80.0–100.0)
Platelets: 134 10*3/uL — ABNORMAL LOW (ref 150–400)
RBC: 4.32 MIL/uL (ref 3.87–5.11)
RDW: 12.7 % (ref 11.5–15.5)
WBC: 4.8 10*3/uL (ref 4.0–10.5)
nRBC: 0 % (ref 0.0–0.2)

## 2018-12-09 LAB — COMPREHENSIVE METABOLIC PANEL
ALT: 26 U/L (ref 0–44)
AST: 19 U/L (ref 15–41)
Albumin: 4.1 g/dL (ref 3.5–5.0)
Alkaline Phosphatase: 85 U/L (ref 38–126)
Anion gap: 9 (ref 5–15)
BUN: 9 mg/dL (ref 6–20)
CO2: 23 mmol/L (ref 22–32)
Calcium: 8.6 mg/dL — ABNORMAL LOW (ref 8.9–10.3)
Chloride: 108 mmol/L (ref 98–111)
Creatinine, Ser: 0.89 mg/dL (ref 0.44–1.00)
GFR calc Af Amer: >60 mL/min (ref >60)
GFR calc non Af Amer: >60 mL/min (ref >60)
Glucose, Bld: 123 mg/dL — ABNORMAL HIGH (ref 70–99)
Potassium: 3.3 mmol/L — ABNORMAL LOW (ref 3.5–5.1)
Sodium: 140 mmol/L (ref 135–145)
Total Bilirubin: 0.8 mg/dL (ref 0.3–1.2)
Total Protein: 6.9 g/dL (ref 6.5–8.1)

## 2018-12-09 LAB — URINALYSIS, COMPLETE (UACMP) WITH MICROSCOPIC
Bilirubin Urine: NEGATIVE
Glucose, UA: NEGATIVE mg/dL
Hgb urine dipstick: NEGATIVE
Ketones, ur: NEGATIVE mg/dL
Nitrite: NEGATIVE
Protein, ur: NEGATIVE mg/dL
Specific Gravity, Urine: 1.026 (ref 1.005–1.030)
WBC, UA: >50 WBC/hpf — ABNORMAL HIGH (ref 0–5)
pH: 6 (ref 5.0–8.0)

## 2018-12-09 LAB — FIBRIN DERIVATIVES D-DIMER (ARMC ONLY): Fibrin derivatives D-dimer (ARMC): 2546.72 ng/mL (FEU) — ABNORMAL HIGH (ref 0.00–499.00)

## 2018-12-09 LAB — TROPONIN I (HIGH SENSITIVITY)
Troponin I (High Sensitivity): 3 ng/L (ref <18)
Troponin I (High Sensitivity): 4 ng/L (ref <18)

## 2018-12-09 MED ORDER — SODIUM CHLORIDE 0.9 % IV BOLUS
1000.0000 mL | Freq: Once | INTRAVENOUS | Status: AC
  Administered 2018-12-09: 1000 mL via INTRAVENOUS

## 2018-12-09 MED ORDER — MECLIZINE HCL 25 MG PO TABS: 25.0000 mg | ORAL_TABLET | Freq: Three times a day (TID) | ORAL | 0 refills | Status: DC | PRN

## 2018-12-09 MED ORDER — MECLIZINE HCL 25 MG PO TABS
25.0000 mg | ORAL_TABLET | Freq: Once | ORAL | Status: AC
  Administered 2018-12-09: 25 mg via ORAL
  Filled 2018-12-09: qty 1

## 2018-12-09 MED ORDER — KETOROLAC TROMETHAMINE 30 MG/ML IJ SOLN
15.0000 mg | Freq: Once | INTRAMUSCULAR | Status: AC
  Administered 2018-12-09: 15 mg via INTRAVENOUS
  Filled 2018-12-09: qty 1

## 2018-12-09 MED ORDER — IOHEXOL 350 MG/ML SOLN
75.0000 mL | Freq: Once | INTRAVENOUS | Status: AC | PRN
  Administered 2018-12-09: 75 mL via INTRAVENOUS
  Filled 2018-12-09: qty 75

## 2018-12-09 MED ORDER — ACETAMINOPHEN 500 MG PO TABS
1000.0000 mg | ORAL_TABLET | Freq: Once | ORAL | Status: AC
  Administered 2018-12-09: 1000 mg via ORAL
  Filled 2018-12-09: qty 2

## 2018-12-09 MED ORDER — CEFUROXIME AXETIL 250 MG PO TABS: 250.0000 mg | ORAL_TABLET | Freq: Two times a day (BID) | ORAL | 0 refills | Status: AC

## 2018-12-09 NOTE — ED Provider Notes (Signed)
[facility] Emergency Department Provider Note  ____________________________________________   First MD Initiated Contact with Patient 12/09/18 [phone]     (approximate)  I have reviewed the triage vital signs and the nursing notes.   HISTORY  Chief Complaint Dizziness and Chest Pain    HPI Holly Nicholson is a 33 y.o. female with hypertension, tubal ligation and then hysterectomy who presents with dizziness and chest pain.  Patient reports that she woke up this morning with dizziness that is worse with standing up or moving around.  She denies any fevers.  She denies dizziness prior.  The dizziness is moderate, intermittent, nothing makes it better, worse with movements.  She also reports having some chest pain that started about 2 hours ago that is in the middle of her chest, moderate, nonradiating.  Associate with a little bit of shortness of breath as well.  She not on any estrogen or has any other risk factors for PE.     Past Medical History:  Diagnosis Date   Anxiety    BMI 45.0-49.9, adult (HCC)    Depression    Elevated blood pressure    GERD (gastroesophageal reflux disease)    History of bilateral tubal ligation 2007   History of bilateral tubal ligation 2007   Hypertension    Low-lying placenta    Lower extremity edema    Tubal pregnancy Aug 2016    Patient Active Problem List   Diagnosis Date Noted   MDD (major depressive disorder) 05/04/2018   S/P laparoscopic hysterectomy 10/30/2017   Depression 12/25/2016   Female sterility 11/01/2016   BMI 45.0-49.9, adult (HCC) 04/27/2016   Influenza B 04/13/2016   Biliary colic 09/01/2015   Cholelithiasis without cholecystitis    Swelling of both ankles 07/21/2015   Morbid obesity (HCC) 09/06/2014   Vitamin D deficiency 09/04/2014   Foot pain, right 09/04/2014   Generalized anxiety disorder 08/20/2014   Emesis 08/20/2014   Bipolar affective disorder, currently  depressed, moderate (HCC) 08/20/2014   Other fatigue 08/20/2014   Tobacco abuse 08/20/2014   Preventative health care 08/20/2014   Transient elevated blood pressure 08/20/2014   Encounter for sterilization 08/20/2014    Past Surgical History:  Procedure Laterality Date   ABDOMINAL HYSTERECTOMY     CHOLECYSTECTOMY N/A 09/02/2015   Procedure: LAPAROSCOPIC CHOLECYSTECTOMY;  Surgeon: Leafy Ro, MD;  Location: [facility] ORS;  Service: General;  Laterality: N/A;   KNEE SURGERY     LAPAROSCOPIC HYSTERECTOMY Bilateral 10/30/2017   Procedure: HYSTERECTOMY TOTAL LAPAROSCOPIC-BILATERAL SALPINGECTOMY;  Surgeon: Vena Austria, MD;  Location: [facility] ORS;  Service: Gynecology;  Laterality: Bilateral;   LAPAROSCOPIC SALPINGO OOPHERECTOMY N/A 10/19/2016   Procedure: LAPAROSCOPIC SALPINGECTOMY bilateral;  Surgeon: Nadara Mustard, MD;  Location: [facility] ORS;  Service: Gynecology;  Laterality: N/A;   TUBAL LIGATION     Tubal reanastamosis      Prior to Admission medications   Medication Sig Start Date End Date Taking? Authorizing Provider  FLUoxetine (PROZAC) 10 MG capsule TAKE 1 CAPSULE BY MOUTH DAILY 06/27/18   Clapacs, Jackquline Denmark, MD  traZODone (DESYREL) 50 MG tablet Take 1 tablet (50 mg total) by mouth at bedtime. 05/06/18   Clapacs, Jackquline Denmark, MD  ziprasidone (GEODON) 20 MG capsule Take 1 capsule (20 mg total) by mouth 2 (two) times daily with a meal. 05/06/18   Clapacs, Jackquline Denmark, MD    Allergies Onion and Latex  Family History  Problem Relation Age of Onset   Heart disease Mother  Hyperlipidemia Mother    Hypertension Mother    Depression Mother    Anxiety disorder Mother    Diabetes Father    Heart disease Father    Hyperlipidemia Father    Hypertension Father    Stroke Father    Cancer Maternal Grandfather        lung   Cancer Paternal Grandmother        stomach   Epilepsy Daughter    Asthma Son    Stroke Paternal Grandfather    Depression Maternal Aunt     Anxiety disorder Maternal Aunt     Social History Social History   Tobacco Use   Smoking status: Current Every Day Smoker    Packs/day: 1.50    Years: 2.00    Pack years: 3.00    Types: Cigarettes   Smokeless tobacco: Never Used  Substance Use Topics   Alcohol use: Not Currently   Drug use: Yes    Types: Marijuana      Review of Systems Constitutional: No fever/chills, dizziness Eyes: No visual changes. ENT: No sore throat. Cardiovascular: Positive chest pain Respiratory: Positive shortness of breath Gastrointestinal: No abdominal pain.  No nausea, no vomiting.  No diarrhea.  No constipation. Genitourinary: Negative for dysuria. Musculoskeletal: Negative for back pain. Skin: Negative for rash. Neurological: Negative for headaches, focal weakness or numbness. All other ROS negative ____________________________________________   PHYSICAL EXAM:  VITAL SIGNS: ED Triage Vitals  Enc Vitals Group     BP 12/09/18 0626 120/86     Pulse Rate 12/09/18 0626 (!) 102     Resp 12/09/18 0626 20     Temp 12/09/18 0626 98.9 F (37.2 C)     Temp Source 12/09/18 0626 Oral     SpO2 12/09/18 0626 97 %     Weight 12/09/18 0622 230 lb (104.3 kg)     Height 12/09/18 0622 5\' 1"  (1.549 m)     Head Circumference --      Peak Flow --      Pain Score 12/09/18 0622 8     Pain Loc --      Pain Edu? --      Excl. in GC? --     Constitutional: Alert and oriented. Well appearing and in no acute distress. Eyes: Conjunctivae are normal. EOMI. Head: Atraumatic. Nose: No congestion/rhinnorhea.  TMs clear bilaterally Mouth/Throat: Mucous membranes are moist.  Poor dentition Neck: No stridor. Trachea Midline. FROM Cardiovascular: Tachycardic, regular rhythm. Grossly normal heart sounds.  Good peripheral circulation.  Chest wall tenderness Respiratory: Normal respiratory effort.  No retractions. Lungs CTAB. Gastrointestinal: Soft and nontender. No distention. No abdominal bruits.    Musculoskeletal: No lower extremity tenderness nor edema.  No joint effusions. Neurologic:  Normal speech and language. No gross focal neurologic deficits are appreciated.  Cranial nerves II through XII are intact.  Able to ambulate without any ataxia. Skin:  Skin is warm, dry and intact. No rash noted. Psychiatric: Mood and affect are normal. Speech and behavior are normal. GU: Deferred   ____________________________________________   LABS (all labs ordered are listed, but only abnormal results are displayed)  Labs Reviewed  CBC - Abnormal; Notable for the following components:      Result Value   Platelets 134 (*)    All other components within normal limits  URINALYSIS, COMPLETE (UACMP) WITH MICROSCOPIC - Abnormal; Notable for the following components:   Color, Urine YELLOW (*)    APPearance HAZY (*)    Leukocytes,Ua SMALL (*)  WBC, UA >50 (*)    Bacteria, UA RARE (*)    All other components within normal limits  COMPREHENSIVE METABOLIC PANEL - Abnormal; Notable for the following components:   Potassium 3.3 (*)    Glucose, Bld 123 (*)    Calcium 8.6 (*)    All other components within normal limits  FIBRIN DERIVATIVES D-DIMER ([facility] ONLY) - Abnormal; Notable for the following components:   Fibrin derivatives D-dimer [facility]) 7,741.42 (*)    All other components within normal limits  URINE CULTURE  TROPONIN I (HIGH SENSITIVITY)  TROPONIN I (HIGH SENSITIVITY)   ____________________________________________   ED ECG REPORT I, Concha Se, the attending physician, personally viewed and interpreted this ECG.  EKG is sinus tachycardia rate of 114, no ST elevations, no T wave inversions, normal intervals ____________________________________________  RADIOLOGY   Official radiology report(s): Ct Angio Chest Pe W And/or Wo Contrast  Result Date: 12/09/2018 CLINICAL DATA:  Chest pain. Dizziness. EXAM: CT ANGIOGRAPHY CHEST WITH CONTRAST TECHNIQUE: Multidetector CT imaging of  the chest was performed using the standard protocol during bolus administration of intravenous contrast. Multiplanar CT image reconstructions and MIPs were obtained to evaluate the vascular anatomy. CONTRAST:  53mL OMNIPAQUE IOHEXOL 350 MG/ML SOLN COMPARISON:  Chest x-ray 12/09/2018 FINDINGS: Cardiovascular: Heart size is normal without pericardial effusion. Aortic caliber and contour are normal. No signs of acute aortic process. Central pulmonary vasculature to the lobar level shows no signs of pulmonary embolism. Subsegmental and segmental assessment is limited secondary to respiratory motion. Mediastinum/Nodes: No signs of adenopathy in the chest. Esophagus and thoracic inlet structures are unremarkable. Lungs/Pleura: No signs of consolidation or pleural effusion. No pneumothorax. Airways are patent. Upper Abdomen: Post cholecystectomy. Lobular hepatic contours. Liver and spleen are incompletely imaged. No acute upper abdominal process. Musculoskeletal: No signs of acute bone finding or evidence of destructive bone process. Review of the MIP images confirms the above findings. IMPRESSION: 1. No evidence of pulmonary embolus to the lobar level. Subsegmental and segmental assessment is limited secondary to respiratory motion. 2. No acute cardiopulmonary process. Aortic Atherosclerosis (ICD10-I70.0). Electronically Signed   By: Donzetta Kohut M.D.   On: 12/09/2018 10:32   Dg Chest Portable 1 View  Result Date: 12/09/2018 CLINICAL DATA:  Chest pain EXAM: PORTABLE CHEST 1 VIEW COMPARISON:  March 17, 2018 FINDINGS: The heart size and mediastinal contours are within normal limits. Both lungs are clear. No pleural effusion or pneumothorax. The visualized skeletal structures are unremarkable. IMPRESSION: No acute process in the chest. Electronically Signed   By: Guadlupe Spanish M.D.   On: 12/09/2018 09:18    ____________________________________________   PROCEDURES  Procedure(s) performed (including Critical  Care):  Procedures   ____________________________________________   INITIAL IMPRESSION / ASSESSMENT AND PLAN / ED COURSE  Holly Nicholson was evaluated in Emergency Department on 12/09/2018 for the symptoms described in the history of present illness. She was evaluated in the context of the global COVID-19 pandemic, which necessitated consideration that the patient might be at risk for infection with the SARS-CoV-2 virus that causes COVID-19. Institutional protocols and algorithms that pertain to the evaluation of patients at risk for COVID-19 are in a state of rapid change based on information released by regulatory bodies including the CDC and federal and state organizations. These policies and algorithms were followed during the patient's care in the ED.    Patient is a 33 year old who presents with dizziness, chest pain, shortness of breath.  Patient's dizziness seems positional nature.  Most  likely vertigo.  No recent neck manipulation to suggest dissection.  No  ataxia to suggest stroke given patient's age I think a posterior stroke is very unlikely.  Will give meclizine.  Patient is however a little tachycardic.  Will give a liter of fluids in case patient is dehydrated.  She does report using THC last night.  Given patient is having chest pain will get cardiac markers.  Also get D-dimer due to tachycardia and reported shortness of breath to evaluate for PE.  Her chest pain seems reproducible however setting is more likely musculoskeletal.Will get chest x-ray to evaluate for pneumonia, pneumothorax.  Cardiac markers are negative x2.  D-dimer is elevated therefore will get CT PE.  Other labs are reassuring.   Patient urine the concern for UTI.  No RBCs to suggest kidney stone patient had no back pain.  Culture sent we will give a course of cefuroxime.   Reevaluated patient. She still has a little bit of dizziness but much better than prior.  Patient is able to ambulate without any  assistance.  Patient feels comfortable with discharge home and follow-up with her primary care doctor.  We will give her a course of meclizine.     ____________________________________________   FINAL CLINICAL IMPRESSION(S) / ED DIAGNOSES   Final diagnoses:  Dizziness  Cystitis  Vertigo      MEDICATIONS GIVEN DURING THIS VISIT:  Medications  sodium chloride 0.9 % bolus 1,000 mL (0 mLs Intravenous Stopped 12/09/18 1239)  meclizine (ANTIVERT) tablet 25 mg (25 mg Oral Given 12/09/18 0903)  ketorolac (TORADOL) 30 MG/ML injection 15 mg (15 mg Intravenous Given 12/09/18 0903)  acetaminophen (TYLENOL) tablet 1,000 mg (1,000 mg Oral Given 12/09/18 0903)  iohexol (OMNIPAQUE) 350 MG/ML injection 75 mL (75 mLs Intravenous Contrast Given 12/09/18 1014)     ED Discharge Orders         Ordered    meclizine (ANTIVERT) 25 MG tablet  3 times daily PRN     12/09/18 1218    cefUROXime (CEFTIN) 250 MG tablet  2 times daily with meals     12/09/18 1218           Note:  This document was prepared using Dragon voice recognition software and may include unintentional dictation errors.   Vanessa Speed, MD 12/09/18 1352

## 2018-12-09 NOTE — ED Notes (Signed)
Pt to CT

## 2018-12-09 NOTE — ED Triage Notes (Signed)
Patient brought in by ems from home. Patient with complaint of dizziness when she got up to the bathroom this morning. Patient also complaints of chest pressure. Patient states that her symptoms started about 45 minutes ago.

## 2018-12-09 NOTE — ED Notes (Signed)
Pt back from CT and to bathroom.

## 2018-12-09 NOTE — ED Notes (Signed)
Pt reports has been dizzy for the past hour. Pt state she got up to go to the bathroom and had a hard time with balance. Pt states it feels like the room is spinning. Pt reports also had a pain in her chest that has since subsided. She described the pain as heavy in nature.

## 2018-12-09 NOTE — Discharge Instructions (Addendum)
Your work-up was reassuring.  Your CT PE did not show any evidence of pulmonary embolism.  Urine looks concerning for UTI so I will give you a course of antibiotics for that and also give you some meclizine to help with the dizziness.  Return the ER for any worsening symptoms or any other concerns.

## 2018-12-10 LAB — URINE CULTURE: Culture: 90000 — AB

## 2018-12-11 NOTE — Progress Notes (Signed)
Brief Pharmacy Note  Patient is a 33 y/o F who presented to [facility] ED 11/30 c/o dizziness, chest pain, and SOB. CXR without impression of active disease and CTA with no evidence of PE. Denied fevers and urinary symptoms. WBC 4.8. Per chart, UA concerning for UTI (pyuria and rare bacteria) and patient was discharged on cefuroxime x 10 day course. Urine culture has resulted 90k colonies/mL S agalactiae. Patient appropriately covered on discharge antibiotic. No further action indicated.  [place] Resident 11 December 2018

## 2018-12-24 ENCOUNTER — Emergency Department: Payer: BC Managed Care – PPO

## 2018-12-24 ENCOUNTER — Emergency Department
Admission: EM | Admit: 2018-12-24 | Discharge: 2018-12-24 | Disposition: A | Discharge status: Home / Self Care | Payer: BC Managed Care – PPO | Attending: Emergency Medicine | Admitting: Emergency Medicine

## 2018-12-24 ENCOUNTER — Other Ambulatory Visit: Payer: Self-pay

## 2018-12-24 ENCOUNTER — Emergency Department
Admission: EM | Admit: 2018-12-24 | Discharge: 2018-12-24 | Disposition: A | Discharge status: Home / Self Care | Payer: BC Managed Care – PPO | Source: Home / Self Care | Attending: Student | Admitting: Student

## 2018-12-24 DIAGNOSIS — Z79899 Other long term (current) drug therapy: Secondary | ICD-10-CM | POA: Insufficient documentation

## 2018-12-24 DIAGNOSIS — Z5321 Procedure and treatment not carried out due to patient leaving prior to being seen by health care provider: Secondary | ICD-10-CM | POA: Diagnosis not present

## 2018-12-24 DIAGNOSIS — R42 Dizziness and giddiness: Secondary | ICD-10-CM | POA: Diagnosis not present

## 2018-12-24 DIAGNOSIS — Z91040 Latex allergy status: Secondary | ICD-10-CM | POA: Insufficient documentation

## 2018-12-24 DIAGNOSIS — I10 Essential (primary) hypertension: Secondary | ICD-10-CM | POA: Insufficient documentation

## 2018-12-24 DIAGNOSIS — R079 Chest pain, unspecified: Secondary | ICD-10-CM | POA: Diagnosis not present

## 2018-12-24 DIAGNOSIS — F17210 Nicotine dependence, cigarettes, uncomplicated: Secondary | ICD-10-CM | POA: Insufficient documentation

## 2018-12-24 LAB — BASIC METABOLIC PANEL
Anion gap: 10 (ref 5–15)
BUN: 5 mg/dL — ABNORMAL LOW (ref 6–20)
CO2: 24 mmol/L (ref 22–32)
Calcium: 8.9 mg/dL (ref 8.9–10.3)
Chloride: 105 mmol/L (ref 98–111)
Creatinine, Ser: 0.82 mg/dL (ref 0.44–1.00)
GFR calc Af Amer: >60 mL/min (ref >60)
GFR calc non Af Amer: >60 mL/min (ref >60)
Glucose, Bld: 145 mg/dL — ABNORMAL HIGH (ref 70–99)
Potassium: 3.5 mmol/L (ref 3.5–5.1)
Sodium: 139 mmol/L (ref 135–145)

## 2018-12-24 LAB — CBC
HCT: 38.1 % (ref 36.0–46.0)
Hemoglobin: 13.9 g/dL (ref 12.0–15.0)
MCH: 30.2 pg (ref 26.0–34.0)
MCHC: 36.5 g/dL — ABNORMAL HIGH (ref 30.0–36.0)
MCV: 82.8 fL (ref 80.0–100.0)
Platelets: 179 10*3/uL (ref 150–400)
RBC: 4.6 MIL/uL (ref 3.87–5.11)
RDW: 12.7 % (ref 11.5–15.5)
WBC: 7.1 10*3/uL (ref 4.0–10.5)
nRBC: 0 % (ref 0.0–0.2)

## 2018-12-24 LAB — URINALYSIS, COMPLETE (UACMP) WITH MICROSCOPIC
Bacteria, UA: NONE SEEN
Bilirubin Urine: NEGATIVE
Glucose, UA: NEGATIVE mg/dL
Hgb urine dipstick: NEGATIVE
Ketones, ur: NEGATIVE mg/dL
Leukocytes,Ua: NEGATIVE
Nitrite: NEGATIVE
Protein, ur: NEGATIVE mg/dL
Specific Gravity, Urine: 1.005 (ref 1.005–1.030)
pH: 6 (ref 5.0–8.0)

## 2018-12-24 LAB — TROPONIN I (HIGH SENSITIVITY): Troponin I (High Sensitivity): 4 ng/L (ref <18)

## 2018-12-24 MED ORDER — ACETAMINOPHEN 500 MG PO TABS
1000.0000 mg | ORAL_TABLET | Freq: Once | ORAL | Status: AC
  Administered 2018-12-24: 1000 mg via ORAL
  Filled 2018-12-24: qty 2

## 2018-12-24 MED ORDER — MECLIZINE HCL 25 MG PO TABS
25.0000 mg | ORAL_TABLET | Freq: Once | ORAL | Status: AC
  Administered 2018-12-24: 25 mg via ORAL
  Filled 2018-12-24: qty 1

## 2018-12-24 MED ORDER — MECLIZINE HCL 25 MG PO TABS: 25.0000 mg | ORAL_TABLET | Freq: Three times a day (TID) | ORAL | 0 refills | Status: DC | PRN

## 2018-12-24 NOTE — ED Triage Notes (Signed)
Pt arrived via POV with husband, reports around 3am, started having dizziness and sxs of vertigo, pt states she tried to take Meclizine, reports chest pain as well that started with the dizziness.  Pt has been seen for same sxs several other times over the past month.  Pt states she feels like the room is spinning , pt describes her chest pain as a tightness 8/10 on pain scale.  Pt alert and oriented on arrival, speech clear, no facial droop present. Pt holding onto wheelchair arm rests.

## 2018-12-24 NOTE — ED Notes (Signed)
Discussed patient with Dr. Alfred Levins, no additional orders given at this time.

## 2018-12-24 NOTE — ED Notes (Signed)
See triage note  Presents with dizziness   States she was here this am but left AMA .  States she had to go home and check on family    States she is dizzy with some nausea  Also has had slight chest pain eariler

## 2018-12-24 NOTE — ED Triage Notes (Signed)
Pt is here via EMS from home with c/o dizziness with a dx of vertigo, states she is out of her medication for the vertigo. Was here earlier and left AMA

## 2018-12-24 NOTE — Discharge Instructions (Signed)
Follow-up with your primary care provider or make an appointment with Dr. Tami Ribas who is on-call for [facility] for further evaluation of your persistent dizziness.  Take Antivert as prescribed.  You may take Tylenol or ibuprofen with this medication as needed.  Increase fluids.

## 2018-12-24 NOTE — ED Notes (Signed)
Urine sample sent to lab with chart label, unable to print labels.

## 2018-12-24 NOTE — ED Provider Notes (Signed)
[facility] Emergency Department Provider Note   ____________________________________________   First MD Initiated Contact with Patient 12/24/18 1153     (approximate)  I have reviewed the triage vital signs and the nursing notes.   HISTORY  Chief Complaint Dizziness   HPI Holly Nicholson is a 33 y.o. female presents to the ED from home via EMS with complaint of dizziness.  Patient was diagnosed with vertigo and states she ran out of her medication.  She was here earlier today and left AMA.  Patient states that this dizziness is no different than the dizziness that she has experienced in the past.  There is some nausea with her dizziness but no active vomiting.  She denies any visual changes, headache, hearing change, fever or chills.  Patient also reports that she has had a CT scan in the past which was negative.  Currently she rates her pain a 0/10.     Past Medical History:  Diagnosis Date  . Anxiety   . BMI 45.0-49.9, adult ([place])   . Depression   . Elevated blood pressure   . GERD (gastroesophageal reflux disease)   . History of bilateral tubal ligation 2007  . History of bilateral tubal ligation 2007  . Hypertension   . Low-lying placenta   . Lower extremity edema   . Tubal pregnancy Aug 2016    Patient Active Problem List   Diagnosis Date Noted  . MDD (major depressive disorder) 05/04/2018  . S/P laparoscopic hysterectomy 10/30/2017  . Depression 12/25/2016  . Female sterility 11/01/2016  . BMI 45.0-49.9, adult ([place]) 04/27/2016  . Influenza B 04/13/2016  . Biliary colic 43/15/4008  . Cholelithiasis without cholecystitis   . Swelling of both ankles 07/21/2015  . Morbid obesity ([place]) 09/06/2014  . Vitamin D deficiency 09/04/2014  . Foot pain, right 09/04/2014  . Generalized anxiety disorder 08/20/2014  . Emesis 08/20/2014  . Bipolar affective disorder, currently depressed, moderate ([place]) 08/20/2014  . Other fatigue 08/20/2014  .  Tobacco abuse 08/20/2014  . Preventative health care 08/20/2014  . Transient elevated blood pressure 08/20/2014  . Encounter for sterilization 08/20/2014    Past Surgical History:  Procedure Laterality Date  . ABDOMINAL HYSTERECTOMY    . CHOLECYSTECTOMY N/A 09/02/2015   Procedure: LAPAROSCOPIC CHOLECYSTECTOMY;  Surgeon: Jules Husbands, MD;  Location: [facility] ORS;  Service: General;  Laterality: N/A;  . KNEE SURGERY    . LAPAROSCOPIC HYSTERECTOMY Bilateral 10/30/2017   Procedure: HYSTERECTOMY TOTAL LAPAROSCOPIC-BILATERAL SALPINGECTOMY;  Surgeon: Malachy Mood, MD;  Location: [facility] ORS;  Service: Gynecology;  Laterality: Bilateral;  . LAPAROSCOPIC SALPINGO OOPHERECTOMY N/A 10/19/2016   Procedure: LAPAROSCOPIC SALPINGECTOMY bilateral;  Surgeon: Gae Dry, MD;  Location: [facility] ORS;  Service: Gynecology;  Laterality: N/A;  . TUBAL LIGATION    . Tubal reanastamosis      Prior to Admission medications   Medication Sig Start Date End Date Taking? Authorizing Provider  FLUoxetine (PROZAC) 10 MG capsule TAKE 1 CAPSULE BY MOUTH DAILY 06/27/18   Clapacs, Madie Reno, MD  meclizine (ANTIVERT) 25 MG tablet Take 1 tablet (25 mg total) by mouth 3 (three) times daily as needed for dizziness. 12/24/18   Johnn Hai, PA-C  traZODone (DESYREL) 50 MG tablet Take 1 tablet (50 mg total) by mouth at bedtime. 05/06/18   Clapacs, Madie Reno, MD  ziprasidone (GEODON) 20 MG capsule Take 1 capsule (20 mg total) by mouth 2 (two) times daily with a meal. 05/06/18   Clapacs, Madie Reno,  MD    Allergies Onion and Latex  Family History  Problem Relation Age of Onset  . Heart disease Mother   . Hyperlipidemia Mother   . Hypertension Mother   . Depression Mother   . Anxiety disorder Mother   . Diabetes Father   . Heart disease Father   . Hyperlipidemia Father   . Hypertension Father   . Stroke Father   . Cancer Maternal Grandfather        lung  . Cancer Paternal Grandmother        stomach  . Epilepsy Daughter     . Asthma Son   . Stroke Paternal Grandfather   . Depression Maternal Aunt   . Anxiety disorder Maternal Aunt     Social History Social History   Tobacco Use  . Smoking status: Current Every Day Smoker    Packs/day: 1.50    Years: 2.00    Pack years: 3.00    Types: Cigarettes  . Smokeless tobacco: Never Used  Substance Use Topics  . Alcohol use: Not Currently  . Drug use: Yes    Types: Marijuana    Review of Systems Constitutional: No fever/chills Eyes: No visual changes. ENT: No sore throat. Cardiovascular: Denies chest pain. Respiratory: Denies shortness of breath. Gastrointestinal: No abdominal pain.  Positive nausea, no vomiting.  No diarrhea.   Genitourinary: Negative for dysuria. Muscle skeletal: Negative for muscle aches. Skin: Negative for rash. Neurological: Negative for headaches, focal weakness or numbness. ____________________________________________   PHYSICAL EXAM:  VITAL SIGNS: ED Triage Vitals  Enc Vitals Group     BP 12/24/18 1139 137/89     Pulse Rate 12/24/18 1139 85     Resp 12/24/18 1139 18     Temp 12/24/18 1139 98.6 F (37 C)     Temp Source 12/24/18 1139 Oral     SpO2 12/24/18 1139 96 %     Weight 12/24/18 1140 230 lb (104.3 kg)     Height 12/24/18 1140 5\' 1"  (1.549 m)     Head Circumference --      Peak Flow --      Pain Score 12/24/18 1139 0     Pain Loc --      Pain Edu? --      Excl. in GC? --    Constitutional: Alert and oriented. Well appearing and in no acute distress.  Patient currently is lying on her stomach and does not appear to be any acute distress.  She is cooperative with exam. Eyes: Conjunctivae are normal. PERRL. EOMI. Head: Atraumatic. Nose: No congestion/rhinnorhea.  EACs and TMs are clear bilaterally. Neck: No stridor.  No cervical tenderness on palpation posteriorly. Hematological/Lymphatic/Immunilogical: No cervical lymphadenopathy. Cardiovascular: Normal rate, regular rhythm. Grossly normal heart sounds.   Good peripheral circulation. Respiratory: Normal respiratory effort.  No retractions. Lungs CTAB. Musculoskeletal: Moves upper and lower extremities that any difficulty.   Neurologic:  Normal speech and language. No gross focal neurologic deficits are appreciated.  Skin:  Skin is warm, dry and intact. No rash noted. Psychiatric: Mood and affect are normal. Speech and behavior are normal.  ____________________________________________   LABS (all labs ordered are listed, but only abnormal results are displayed)  Labs Reviewed - No data to display  RADIOLOGY   Official radiology report(s): DG Chest 2 View  Result Date: 12/24/2018 CLINICAL DATA:  Chest pain. Dizziness. Vertigo. EXAM: CHEST - 2 VIEW COMPARISON:  Radiographs and CTA 12/09/2018 FINDINGS: The cardiomediastinal contours are normal. The lungs are  clear. Pulmonary vasculature is normal. No consolidation, pleural effusion, or pneumothorax. No acute osseous abnormalities are seen. IMPRESSION: Normal radiographs of the chest. Electronically Signed   By: Narda RutherfordMelanie  Sanford M.D.   On: 12/24/2018 05:56    ____________________________________________   PROCEDURES  Procedure(s) performed (including Critical Care):  Procedures   ____________________________________________   INITIAL IMPRESSION / ASSESSMENT AND PLAN / ED COURSE  As part of my medical decision making, I reviewed the following data within the electronic MEDICAL RECORD NUMBER Notes from prior ED visits and [place] Controlled Substance Database  33 year old female presents to the ED via EMS with complaints of dizziness.  Patient was here in the ED earlier today but left AMA.  Patient has history of dizziness and has been worked up with CT being negative.  She states that this is her normal vertigo with no changes.  She reports that she in the past has taken Antivert with relief of her dizziness but ran out.  Patient was given Antivert while in the ED and improved.  A  prescription for the same was sent to her pharmacy.  She was also encouraged to follow-up with [facility] for further evaluation and possible treatment of her dizziness.  ____________________________________________   FINAL CLINICAL IMPRESSION(S) / ED DIAGNOSES  Final diagnoses:  Dizziness     ED Discharge Orders         Ordered    meclizine (ANTIVERT) 25 MG tablet  3 times daily PRN     12/24/18 1400           Note:  This document was prepared using Dragon voice recognition software and may include unintentional dictation errors.    Tommi RumpsSummers, Kyro Joswick L, PA-C 12/24/18 1412    Miguel AschoffMonks, Sarah L., MD 12/24/18 [phone]

## 2018-12-24 NOTE — ED Triage Notes (Signed)
FIRST NURSE NOTE: Pt arrived via POV with family with reports of "vertigo attack" pt took pills but family states "they don't seem to be working".  Pt in wheelchair on arrival.

## 2018-12-24 NOTE — ED Notes (Signed)
Pt assisted to bathroom by Mayra EDT, per Mayra, pt did well standing up from wheelchair to the toilet and was able to hold cup to catch urine. Pt continues to complain of dizziness.

## 2019-01-06 DIAGNOSIS — R42 Dizziness and giddiness: Secondary | ICD-10-CM | POA: Diagnosis not present

## 2019-01-15 DIAGNOSIS — R42 Dizziness and giddiness: Secondary | ICD-10-CM | POA: Diagnosis not present

## 2019-02-10 ENCOUNTER — Ambulatory Visit (INDEPENDENT_AMBULATORY_CARE_PROVIDER_SITE_OTHER): Payer: BC Managed Care – PPO | Admitting: Family Medicine

## 2019-02-10 ENCOUNTER — Other Ambulatory Visit: Payer: Self-pay

## 2019-02-10 ENCOUNTER — Encounter: Payer: Self-pay | Admitting: Family Medicine

## 2019-02-10 VITALS — BP 138/85 | HR 80 | Temp 98.5°F | Ht 62.0 in | Wt 260.0 lb

## 2019-02-10 DIAGNOSIS — I10 Essential (primary) hypertension: Secondary | ICD-10-CM

## 2019-02-10 DIAGNOSIS — R42 Dizziness and giddiness: Secondary | ICD-10-CM

## 2019-02-10 DIAGNOSIS — R739 Hyperglycemia, unspecified: Secondary | ICD-10-CM | POA: Diagnosis not present

## 2019-02-10 MED ORDER — AMLODIPINE BESYLATE 2.5 MG PO TABS: 2.5000 mg | ORAL_TABLET | Freq: Every day | ORAL | 0 refills | Status: DC

## 2019-02-10 NOTE — Progress Notes (Signed)
BP 138/85   Pulse 80   Temp 98.5 F (36.9 C) (Oral)   Ht 5\' 2"  (1.575 m)   Wt 260 lb (117.9 kg)   LMP 08/13/2017 (Exact Date)   SpO2 97%   BMI 47.55 kg/m    Subjective:    Patient ID: Holly Nicholson, female    DOB: July 22, 1985, 34 y.o.   MRN: [id number]  HPI: Holly Nicholson is a 34 y.o. female  Chief Complaint  Patient presents with  . Hypertension    x 3 months. pt states she has been to the hospital 4 times in th elast 3 months due to high BP  . Dizziness   Patient presenting today for numerous concerns.   BPs running 140s-160s intermittently the past few months, face gets flushed and she gets vertigo spells related to these episodes. Was put on meclizine and went to ENT for the vertigo. The meclizine helps quite a bit and now can't function without taking it 2 times a day. Has not set up follow up for this with ENT despite no signs of resolution of sxs.   Per record review BSs have run high the past few times she's had labs checked. Notes significant soda intake and does not watch diet or exercise regularly.   Relevant past medical, surgical, family and social history reviewed and updated as indicated. Interim medical history since our last visit reviewed. Allergies and medications reviewed and updated.  Review of Systems  Per HPI unless specifically indicated above     Objective:    BP 138/85   Pulse 80   Temp 98.5 F (36.9 C) (Oral)   Ht 5\' 2"  (1.575 m)   Wt 260 lb (117.9 kg)   LMP 08/13/2017 (Exact Date)   SpO2 97%   BMI 47.55 kg/m   Wt Readings from Last 3 Encounters:  02/10/19 260 lb (117.9 kg)  12/24/18 230 lb (104.3 kg)  12/09/18 230 lb (104.3 kg)    Physical Exam Vitals and nursing note reviewed.  Constitutional:      Appearance: Normal appearance. She is not ill-appearing.  HENT:     Head: Atraumatic.  Eyes:     Extraocular Movements: Extraocular movements intact.     Conjunctiva/sclera: Conjunctivae normal.  Cardiovascular:     Rate  and Rhythm: Normal rate and regular rhythm.     Heart sounds: Normal heart sounds.  Pulmonary:     Effort: Pulmonary effort is normal.     Breath sounds: Normal breath sounds.  Musculoskeletal:        General: Normal range of motion.     Cervical back: Normal range of motion and neck supple.  Skin:    General: Skin is warm and dry.  Neurological:     General: No focal deficit present.     Mental Status: She is alert and oriented to person, place, and time.  Psychiatric:        Mood and Affect: Mood normal.        Thought Content: Thought content normal.        Judgment: Judgment normal.     Results for orders placed or performed in visit on 02/10/19  Comprehensive metabolic panel  Result Value Ref Range   Glucose 91 65 - 99 mg/dL   BUN 9 6 - 20 mg/dL   Creatinine, Ser 0.80 0.57 - 1.00 mg/dL   GFR calc non Af Amer 96 >59 mL/min/1.73   GFR calc Af Amer 110 >59 mL/min/1.73  BUN/Creatinine Ratio 11 9 - 23   Sodium 140 134 - 144 mmol/L   Potassium 3.5 3.5 - 5.2 mmol/L   Chloride 104 96 - 106 mmol/L   CO2 23 20 - 29 mmol/L   Calcium 8.1 (L) 8.7 - 10.2 mg/dL   Total Protein 6.6 6.0 - 8.5 g/dL   Albumin 4.6 3.8 - 4.8 g/dL   Globulin, Total 2.0 1.5 - 4.5 g/dL   Albumin/Globulin Ratio 2.3 (H) 1.2 - 2.2   Bilirubin Total 0.4 0.0 - 1.2 mg/dL   Alkaline Phosphatase 102 39 - 117 IU/L   AST 18 0 - 40 IU/L   ALT 17 0 - 32 IU/L  HgB A1c  Result Value Ref Range   Hgb A1c MFr Bld 4.9 4.8 - 5.6 %   Est. average glucose Bld gHb Est-mCnc 94 mg/dL      Assessment & Plan:   Problem List Items Addressed This Visit      Cardiovascular and Mediastinum   Essential hypertension - Primary    Will start low dose HCTZ given numerous episodes the past few months of elevated BPs. Obtain home monitor and log home readings. Work on [employer], weight loss, stress control.       Relevant Medications   amLODipine (NORVASC) 2.5 MG tablet    Other Visit Diagnoses    Vertigo       Taking  meclizine BID to stave off sxs. Recommended close ENT f/u given lack of improvement over time in sxs. Declines home exercises   Elevated blood sugar       Will check A1C and metabolic panel, work on diet and exercise changes   Relevant Orders   Comprehensive metabolic panel (Completed)   HgB A1c (Completed)       Follow up plan: Return in about 4 weeks (around 03/10/2019) for BP.

## 2019-02-11 LAB — COMPREHENSIVE METABOLIC PANEL
ALT: 17 IU/L (ref 0–32)
AST: 18 IU/L (ref 0–40)
Albumin/Globulin Ratio: 2.3 — ABNORMAL HIGH (ref 1.2–2.2)
Albumin: 4.6 g/dL (ref 3.8–4.8)
Alkaline Phosphatase: 102 IU/L (ref 39–117)
BUN/Creatinine Ratio: 11 (ref 9–23)
BUN: 9 mg/dL (ref 6–20)
Bilirubin Total: 0.4 mg/dL (ref 0.0–1.2)
CO2: 23 mmol/L (ref 20–29)
Calcium: 8.1 mg/dL — ABNORMAL LOW (ref 8.7–10.2)
Chloride: 104 mmol/L (ref 96–106)
Creatinine, Ser: 0.8 mg/dL (ref 0.57–1.00)
GFR calc Af Amer: 110 mL/min/{1.73_m2} (ref >59)
GFR calc non Af Amer: 96 mL/min/{1.73_m2} (ref >59)
Globulin, Total: 2 g/dL (ref 1.5–4.5)
Glucose: 91 mg/dL (ref 65–99)
Potassium: 3.5 mmol/L (ref 3.5–5.2)
Sodium: 140 mmol/L (ref 134–144)
Total Protein: 6.6 g/dL (ref 6.0–8.5)

## 2019-02-11 LAB — HEMOGLOBIN A1C
Est. average glucose Bld gHb Est-mCnc: 94 mg/dL
Hgb A1c MFr Bld: 4.9 % (ref 4.8–5.6)

## 2019-02-12 ENCOUNTER — Encounter: Payer: Self-pay | Admitting: Family Medicine

## 2019-02-16 DIAGNOSIS — I10 Essential (primary) hypertension: Secondary | ICD-10-CM | POA: Insufficient documentation

## 2019-02-16 NOTE — Assessment & Plan Note (Signed)
Will start low dose HCTZ given numerous episodes the past few months of elevated BPs. Obtain home monitor and log home readings. Work on [employer], weight loss, stress control.

## 2019-02-17 ENCOUNTER — Other Ambulatory Visit: Payer: Self-pay | Admitting: Family Medicine

## 2019-02-25 ENCOUNTER — Encounter: Payer: Self-pay | Admitting: Family Medicine

## 2019-02-26 ENCOUNTER — Other Ambulatory Visit: Payer: Self-pay | Admitting: Family Medicine

## 2019-02-26 MED ORDER — AMLODIPINE BESYLATE 2.5 MG PO TABS: 2.5000 mg | ORAL_TABLET | Freq: Every day | ORAL | 0 refills | Status: DC

## 2019-03-10 ENCOUNTER — Ambulatory Visit: Payer: BC Managed Care – PPO | Admitting: Family Medicine

## 2019-03-10 ENCOUNTER — Telehealth (INDEPENDENT_AMBULATORY_CARE_PROVIDER_SITE_OTHER): Payer: BC Managed Care – PPO | Admitting: Family Medicine

## 2019-03-10 ENCOUNTER — Encounter: Payer: Self-pay | Admitting: Family Medicine

## 2019-03-10 VITALS — BP 130/79 | HR 71

## 2019-03-10 DIAGNOSIS — I10 Essential (primary) hypertension: Secondary | ICD-10-CM | POA: Diagnosis not present

## 2019-03-10 MED ORDER — AMLODIPINE BESYLATE 5 MG PO TABS: ORAL_TABLET | ORAL | 0 refills | Status: DC

## 2019-03-10 NOTE — Progress Notes (Signed)
BP 130/79   Pulse 71   LMP 08/13/2017 (Exact Date)    Subjective:    Patient ID: Holly Nicholson, female    DOB: Oct 28, 1985, 34 y.o.   MRN: [id number]  HPI: Holly Nicholson is a 34 y.o. female  Chief Complaint  Patient presents with  . Hypertension    1 month f/up    . This visit was completed via MyChart due to the restrictions of the COVID-19 pandemic. All issues as above were discussed and addressed. Physical exam was done as above through visual confirmation on MyChart. If it was felt that the patient should be evaluated in the office, they were directed there. The patient verbally consented to this visit. . Location of the patient: in parked car . Location of the provider: work . Those involved with this call:  . Provider: Merrie Roof, PA-C . CMA: Lesle Chris, [place] . Front Desk/Registration: Jill Side  . Time spent on call: 15 minutes with patient face to face via video conference. More than 50% of this time was spent in counseling and coordination of care. 5 minutes total spent in review of patient's record and preparation of their chart. I verified patient identity using two factors (patient name and date of birth). Patient consents verbally to being seen via telemedicine visit today.   Patient presenting today for 1 month HTN f/u after addition of low dose amlodipine. Has been taking 1 tab BID as 1 tab in AM was not sufficient. Getting mostly readings in high 130s/80 but occasionally still elevated to 150s-160s in the evenings. Tolerating well without side effects and notes dizziness has improved quite a bit since better BP control.   Relevant past medical, surgical, family and social history reviewed and updated as indicated. Interim medical history since our last visit reviewed. Allergies and medications reviewed and updated.  Review of Systems  Per HPI unless specifically indicated above     Objective:    BP 130/79   Pulse 71   LMP 08/13/2017 (Exact Date)    Wt Readings from Last 3 Encounters:  02/10/19 260 lb (117.9 kg)  12/24/18 230 lb (104.3 kg)  12/09/18 230 lb (104.3 kg)    Physical Exam Vitals and nursing note reviewed.  Constitutional:      General: She is not in acute distress.    Appearance: Normal appearance.  HENT:     Head: Atraumatic.     Right Ear: External ear normal.     Left Ear: External ear normal.     Nose: Nose normal. No congestion.     Mouth/Throat:     Mouth: Mucous membranes are moist.     Pharynx: Oropharynx is clear. No posterior oropharyngeal erythema.  Eyes:     Extraocular Movements: Extraocular movements intact.     Conjunctiva/sclera: Conjunctivae normal.  Cardiovascular:     Comments: Unable to assess via virtual visit Pulmonary:     Effort: Pulmonary effort is normal. No respiratory distress.  Musculoskeletal:        General: Normal range of motion.     Cervical back: Normal range of motion.  Skin:    General: Skin is dry.     Findings: No erythema.  Neurological:     Mental Status: She is alert and oriented to person, place, and time.  Psychiatric:        Mood and Affect: Mood normal.        Thought Content: Thought content normal.  Judgment: Judgment normal.     Results for orders placed or performed in visit on 02/10/19  Comprehensive metabolic panel  Result Value Ref Range   Glucose 91 65 - 99 mg/dL   BUN 9 6 - 20 mg/dL   Creatinine, Ser 9.03 0.57 - 1.00 mg/dL   GFR calc non Af Amer 96 >59 mL/min/1.73   GFR calc Af Amer 110 >59 mL/min/1.73   BUN/Creatinine Ratio 11 9 - 23   Sodium 140 134 - 144 mmol/L   Potassium 3.5 3.5 - 5.2 mmol/L   Chloride 104 96 - 106 mmol/L   CO2 23 20 - 29 mmol/L   Calcium 8.1 (L) 8.7 - 10.2 mg/dL   Total Protein 6.6 6.0 - 8.5 g/dL   Albumin 4.6 3.8 - 4.8 g/dL   Globulin, Total 2.0 1.5 - 4.5 g/dL   Albumin/Globulin Ratio 2.3 (H) 1.2 - 2.2   Bilirubin Total 0.4 0.0 - 1.2 mg/dL   Alkaline Phosphatase 102 39 - 117 IU/L   AST 18 0 - 40 IU/L    ALT 17 0 - 32 IU/L  HgB A1c  Result Value Ref Range   Hgb A1c MFr Bld 4.9 4.8 - 5.6 %   Est. average glucose Bld gHb Est-mCnc 94 mg/dL      Assessment & Plan:   Problem List Items Addressed This Visit      Cardiovascular and Mediastinum   Essential hypertension - Primary    Increase to 5 mg tab once daily with 1/2 tab in evenings if BPs still elevated. Monitor closely, continue working on [employer], exercise      Relevant Medications   amLODipine (NORVASC) 5 MG tablet       Follow up plan: Return in about 4 weeks (around 04/07/2019) for HTN.

## 2019-03-10 NOTE — Assessment & Plan Note (Signed)
Increase to 5 mg tab once daily with 1/2 tab in evenings if BPs still elevated. Monitor closely, continue working on [employer], exercise

## 2019-04-09 ENCOUNTER — Encounter: Payer: Self-pay | Admitting: Family Medicine

## 2019-04-09 ENCOUNTER — Ambulatory Visit (INDEPENDENT_AMBULATORY_CARE_PROVIDER_SITE_OTHER): Payer: BC Managed Care – PPO | Admitting: Family Medicine

## 2019-04-09 ENCOUNTER — Other Ambulatory Visit: Payer: Self-pay

## 2019-04-09 VITALS — BP 119/85 | HR 74 | Temp 98.3°F | Wt 234.0 lb

## 2019-04-09 DIAGNOSIS — R42 Dizziness and giddiness: Secondary | ICD-10-CM

## 2019-04-09 DIAGNOSIS — I10 Essential (primary) hypertension: Secondary | ICD-10-CM | POA: Diagnosis not present

## 2019-04-09 DIAGNOSIS — Z720 Tobacco use: Secondary | ICD-10-CM

## 2019-04-09 NOTE — Progress Notes (Signed)
BP 119/85   Pulse 74   Temp 98.3 F (36.8 C) (Oral)   Wt 234 lb (106.1 kg)   LMP 08/13/2017 (Exact Date)   SpO2 98%   BMI 42.80 kg/m    Subjective:    Patient ID: Holly Nicholson, female    DOB: 1985-11-01, 34 y.o.   MRN: [id number]  HPI: Holly Nicholson is a 34 y.o. female  Chief Complaint  Patient presents with  . Hypertension   Taking 1 amlodipine in the AM noting home BPs running 120/80 range. Tolerating medication well without side effects. Denies CP, SOB, HAs, dizziness. Trying to work on diet and exercise. Also has quit smoking the past month and planning to continue cessation.   Vertigo improved, was finally able to come off meclizine and not having any persistent sxs.   Relevant past medical, surgical, family and social history reviewed and updated as indicated. Interim medical history since our last visit reviewed. Allergies and medications reviewed and updated.  Review of Systems  Per HPI unless specifically indicated above     Objective:    BP 119/85   Pulse 74   Temp 98.3 F (36.8 C) (Oral)   Wt 234 lb (106.1 kg)   LMP 08/13/2017 (Exact Date)   SpO2 98%   BMI 42.80 kg/m   Wt Readings from Last 3 Encounters:  04/09/19 234 lb (106.1 kg)  02/10/19 260 lb (117.9 kg)  12/24/18 230 lb (104.3 kg)    Physical Exam Vitals and nursing note reviewed.  Constitutional:      Appearance: Normal appearance. She is not ill-appearing.  HENT:     Head: Atraumatic.  Eyes:     Extraocular Movements: Extraocular movements intact.     Conjunctiva/sclera: Conjunctivae normal.  Cardiovascular:     Rate and Rhythm: Normal rate and regular rhythm.     Heart sounds: Normal heart sounds.  Pulmonary:     Effort: Pulmonary effort is normal.     Breath sounds: Normal breath sounds.  Musculoskeletal:        General: Normal range of motion.     Cervical back: Normal range of motion and neck supple.  Skin:    General: Skin is warm and dry.  Neurological:   Mental Status: She is alert and oriented to person, place, and time.  Psychiatric:        Mood and Affect: Mood normal.        Thought Content: Thought content normal.        Judgment: Judgment normal.     Results for orders placed or performed in visit on 02/10/19  Comprehensive metabolic panel  Result Value Ref Range   Glucose 91 65 - 99 mg/dL   BUN 9 6 - 20 mg/dL   Creatinine, Ser 0.80 0.57 - 1.00 mg/dL   GFR calc non Af Amer 96 >59 mL/min/1.73   GFR calc Af Amer 110 >59 mL/min/1.73   BUN/Creatinine Ratio 11 9 - 23   Sodium 140 134 - 144 mmol/L   Potassium 3.5 3.5 - 5.2 mmol/L   Chloride 104 96 - 106 mmol/L   CO2 23 20 - 29 mmol/L   Calcium 8.1 (L) 8.7 - 10.2 mg/dL   Total Protein 6.6 6.0 - 8.5 g/dL   Albumin 4.6 3.8 - 4.8 g/dL   Globulin, Total 2.0 1.5 - 4.5 g/dL   Albumin/Globulin Ratio 2.3 (H) 1.2 - 2.2   Bilirubin Total 0.4 0.0 - 1.2 mg/dL   Alkaline Phosphatase 102  39 - 117 IU/L   AST 18 0 - 40 IU/L   ALT 17 0 - 32 IU/L  HgB A1c  Result Value Ref Range   Hgb A1c MFr Bld 4.9 4.8 - 5.6 %   Est. average glucose Bld gHb Est-mCnc 94 mg/dL      Assessment & Plan:   Problem List Items Addressed This Visit      Cardiovascular and Mediastinum   Essential hypertension - Primary    BPs stable and under good control, continue current regimen        Other   Tobacco abuse    Recently quit, congratulated success and encouraged lifelong cessation       Other Visit Diagnoses    Vertigo       Finally resolved, off meclizine. Continue to monitor       Follow up plan: Return in about 6 months (around 10/09/2019) for 6 month f/u.

## 2019-04-09 NOTE — Assessment & Plan Note (Signed)
Recently quit, congratulated success and encouraged lifelong cessation

## 2019-04-09 NOTE — Assessment & Plan Note (Signed)
BPs stable and under good control, continue current regimen 

## 2019-04-17 ENCOUNTER — Other Ambulatory Visit: Payer: Self-pay | Admitting: Family Medicine

## 2019-04-17 NOTE — Telephone Encounter (Signed)
Requested Prescriptions  Pending Prescriptions Disp Refills  . amLODipine (NORVASC) 5 MG tablet [Pharmacy Med Name: AMLODIPINE BESYLATE 5 MG TAB] 135 tablet 1    Sig: TAKE 1 TAB IN THE AM AND 1/2 TAB IN THE PM IF BP STILL ELEVATED     Cardiovascular:  Calcium Channel Blockers Passed - 04/17/2019  1:14 PM      Passed - Last BP in normal range    BP Readings from Last 1 Encounters:  04/09/19 119/85         Passed - Valid encounter within last 6 months    Recent Outpatient Visits          1 week ago Essential hypertension   [facility] Particia [place], [place]   1 month ago Essential hypertension   [facility] Roosvelt Maser [place], [place]   2 months ago Essential hypertension   [facility] Ro[place]velt Maser [place], [place]   11 months ago Generalized anxiety disorder   [facility] [place], Redge Gainer, [place]   1 year ago Bipolar affective disorder, currently depressed, moderate (HCC)   [facility] [place], Salley Hews, [place]      Future Appointments            In 5 months Maurice March, [place]ley Hews, PA-C [facility]

## 2019-06-03 DIAGNOSIS — F3181 Bipolar II disorder: Secondary | ICD-10-CM | POA: Diagnosis not present

## 2019-06-03 DIAGNOSIS — F4312 Post-traumatic stress disorder, chronic: Secondary | ICD-10-CM | POA: Diagnosis not present

## 2019-07-01 DIAGNOSIS — F3181 Bipolar II disorder: Secondary | ICD-10-CM | POA: Diagnosis not present

## 2019-07-01 DIAGNOSIS — F4312 Post-traumatic stress disorder, chronic: Secondary | ICD-10-CM | POA: Diagnosis not present

## 2019-10-09 ENCOUNTER — Ambulatory Visit: Payer: BC Managed Care – PPO | Admitting: Family Medicine

## 2019-10-09 ENCOUNTER — Ambulatory Visit: Payer: BC Managed Care – PPO | Admitting: Unknown Physician Specialty

## 2020-08-30 DIAGNOSIS — Z87891 Personal history of nicotine dependence: Secondary | ICD-10-CM | POA: Diagnosis not present

## 2020-08-30 DIAGNOSIS — N3000 Acute cystitis without hematuria: Secondary | ICD-10-CM | POA: Diagnosis not present

## 2020-08-30 DIAGNOSIS — R440 Auditory hallucinations: Secondary | ICD-10-CM | POA: Diagnosis not present

## 2020-08-30 DIAGNOSIS — F32A Depression, unspecified: Secondary | ICD-10-CM | POA: Diagnosis not present

## 2020-08-30 DIAGNOSIS — Z91410 Personal history of adult physical and sexual abuse: Secondary | ICD-10-CM | POA: Diagnosis not present

## 2020-08-30 DIAGNOSIS — Z62810 Personal history of physical and sexual abuse in childhood: Secondary | ICD-10-CM | POA: Diagnosis not present

## 2020-08-30 DIAGNOSIS — Z79899 Other long term (current) drug therapy: Secondary | ICD-10-CM | POA: Diagnosis not present

## 2020-08-30 DIAGNOSIS — N3001 Acute cystitis with hematuria: Secondary | ICD-10-CM | POA: Diagnosis not present

## 2020-08-30 DIAGNOSIS — F419 Anxiety disorder, unspecified: Secondary | ICD-10-CM | POA: Diagnosis not present

## 2020-08-30 DIAGNOSIS — R102 Pelvic and perineal pain: Secondary | ICD-10-CM | POA: Diagnosis not present

## 2020-08-30 DIAGNOSIS — R1030 Lower abdominal pain, unspecified: Secondary | ICD-10-CM | POA: Diagnosis not present

## 2020-08-30 DIAGNOSIS — R300 Dysuria: Secondary | ICD-10-CM | POA: Diagnosis not present

## 2020-08-30 DIAGNOSIS — R7989 Other specified abnormal findings of blood chemistry: Secondary | ICD-10-CM | POA: Diagnosis not present

## 2020-08-30 DIAGNOSIS — N898 Other specified noninflammatory disorders of vagina: Secondary | ICD-10-CM | POA: Diagnosis not present

## 2020-12-18 DIAGNOSIS — H8110 Benign paroxysmal vertigo, unspecified ear: Secondary | ICD-10-CM | POA: Diagnosis not present

## 2021-02-04 ENCOUNTER — Emergency Department: Payer: BC Managed Care – PPO

## 2021-02-04 ENCOUNTER — Other Ambulatory Visit: Payer: Self-pay

## 2021-02-04 ENCOUNTER — Observation Stay: Payer: BC Managed Care – PPO

## 2021-02-04 ENCOUNTER — Observation Stay
Admission: EM | Admit: 2021-02-04 | Discharge: 2021-02-06 | Disposition: A | Discharge status: Home / Self Care | Payer: BC Managed Care – PPO | Attending: Internal Medicine | Admitting: Internal Medicine

## 2021-02-04 ENCOUNTER — Encounter: Payer: Self-pay | Admitting: Emergency Medicine

## 2021-02-04 DIAGNOSIS — R079 Chest pain, unspecified: Secondary | ICD-10-CM | POA: Diagnosis not present

## 2021-02-04 DIAGNOSIS — R200 Anesthesia of skin: Secondary | ICD-10-CM | POA: Diagnosis not present

## 2021-02-04 DIAGNOSIS — G43109 Migraine with aura, not intractable, without status migrainosus: Secondary | ICD-10-CM

## 2021-02-04 DIAGNOSIS — G8191 Hemiplegia, unspecified affecting right dominant side: Secondary | ICD-10-CM

## 2021-02-04 DIAGNOSIS — F17210 Nicotine dependence, cigarettes, uncomplicated: Secondary | ICD-10-CM | POA: Insufficient documentation

## 2021-02-04 DIAGNOSIS — Z720 Tobacco use: Secondary | ICD-10-CM | POA: Diagnosis not present

## 2021-02-04 DIAGNOSIS — R9431 Abnormal electrocardiogram [ECG] [EKG]: Secondary | ICD-10-CM

## 2021-02-04 DIAGNOSIS — Z20822 Contact with and (suspected) exposure to covid-19: Secondary | ICD-10-CM | POA: Insufficient documentation

## 2021-02-04 DIAGNOSIS — G459 Transient cerebral ischemic attack, unspecified: Principal | ICD-10-CM | POA: Diagnosis present

## 2021-02-04 DIAGNOSIS — R531 Weakness: Secondary | ICD-10-CM

## 2021-02-04 DIAGNOSIS — G43909 Migraine, unspecified, not intractable, without status migrainosus: Secondary | ICD-10-CM

## 2021-02-04 LAB — PROTIME-INR
INR: 1.1 (ref 0.8–1.2)
Prothrombin Time: 13.7 seconds (ref 11.4–15.2)

## 2021-02-04 LAB — COMPREHENSIVE METABOLIC PANEL
ALT: 31 U/L (ref 0–44)
AST: 24 U/L (ref 15–41)
Albumin: 4 g/dL (ref 3.5–5.0)
Alkaline Phosphatase: 67 U/L (ref 38–126)
Anion gap: 10 (ref 5–15)
BUN: 8 mg/dL (ref 6–20)
CO2: 27 mmol/L (ref 22–32)
Calcium: 9 mg/dL (ref 8.9–10.3)
Chloride: 102 mmol/L (ref 98–111)
Creatinine, Ser: 0.97 mg/dL (ref 0.44–1.00)
GFR, Estimated: >60 mL/min (ref >60)
Glucose, Bld: 115 mg/dL — ABNORMAL HIGH (ref 70–99)
Potassium: 3.4 mmol/L — ABNORMAL LOW (ref 3.5–5.1)
Sodium: 139 mmol/L (ref 135–145)
Total Bilirubin: 1 mg/dL (ref 0.3–1.2)
Total Protein: 6.8 g/dL (ref 6.5–8.1)

## 2021-02-04 LAB — CBC
HCT: 36.5 % (ref 36.0–46.0)
Hemoglobin: 12.6 g/dL (ref 12.0–15.0)
MCH: 30.7 pg (ref 26.0–34.0)
MCHC: 34.5 g/dL (ref 30.0–36.0)
MCV: 89 fL (ref 80.0–100.0)
Platelets: 164 10*3/uL (ref 150–400)
RBC: 4.1 MIL/uL (ref 3.87–5.11)
RDW: 12.8 % (ref 11.5–15.5)
WBC: 6.2 10*3/uL (ref 4.0–10.5)
nRBC: 0 % (ref 0.0–0.2)

## 2021-02-04 LAB — CBG MONITORING, ED: Glucose-Capillary: 96 mg/dL (ref 70–99)

## 2021-02-04 LAB — RESP PANEL BY RT-PCR (FLU A&B, COVID) ARPGX2
Influenza A by PCR: NEGATIVE
Influenza B by PCR: NEGATIVE
SARS Coronavirus 2 by RT PCR: NEGATIVE

## 2021-02-04 LAB — APTT: aPTT: 31 seconds (ref 24–36)

## 2021-02-04 LAB — DIFFERENTIAL
Abs Immature Granulocytes: 0.02 10*3/uL (ref 0.00–0.07)
Basophils Absolute: 0 10*3/uL (ref 0.0–0.1)
Basophils Relative: 0 %
Eosinophils Absolute: 0 10*3/uL (ref 0.0–0.5)
Eosinophils Relative: 1 %
Immature Granulocytes: 0 %
Lymphocytes Relative: 21 %
Lymphs Abs: 1.3 10*3/uL (ref 0.7–4.0)
Monocytes Absolute: 0.3 10*3/uL (ref 0.1–1.0)
Monocytes Relative: 5 %
Neutro Abs: 4.5 10*3/uL (ref 1.7–7.7)
Neutrophils Relative %: 73 %

## 2021-02-04 LAB — TROPONIN I (HIGH SENSITIVITY)
Troponin I (High Sensitivity): 4 ng/L (ref <18)
Troponin I (High Sensitivity): 4 ng/L (ref <18)

## 2021-02-04 MED ORDER — MORPHINE SULFATE (PF) 2 MG/ML IV SOLN: 2.0000 mg | INTRAVENOUS | Status: DC | PRN

## 2021-02-04 MED ORDER — NITROGLYCERIN 0.4 MG SL SUBL: 0.4000 mg | SUBLINGUAL_TABLET | SUBLINGUAL | Status: DC | PRN

## 2021-02-04 MED ORDER — ASPIRIN EC 81 MG PO TBEC
81.0000 mg | DELAYED_RELEASE_TABLET | Freq: Every day | ORAL | Status: DC
  Administered 2021-02-05 – 2021-02-06 (×2): 81 mg via ORAL
  Filled 2021-02-04 (×2): qty 1

## 2021-02-04 MED ORDER — STROKE: EARLY STAGES OF RECOVERY BOOK: Freq: Once | Status: DC

## 2021-02-04 MED ORDER — IOHEXOL 350 MG/ML SOLN
75.0000 mL | Freq: Once | INTRAVENOUS | Status: AC | PRN
  Administered 2021-02-04: 75 mL via INTRAVENOUS

## 2021-02-04 MED ORDER — ACETAMINOPHEN 325 MG PO TABS
650.0000 mg | ORAL_TABLET | ORAL | Status: DC | PRN
  Administered 2021-02-04 – 2021-02-06 (×4): 650 mg via ORAL
  Filled 2021-02-04 (×4): qty 2

## 2021-02-04 MED ORDER — SODIUM CHLORIDE 0.9% FLUSH
3.0000 mL | Freq: Once | INTRAVENOUS | Status: AC
  Administered 2021-02-04: 3 mL via INTRAVENOUS

## 2021-02-04 MED ORDER — SODIUM CHLORIDE 0.9 % IV SOLN: INTRAVENOUS | Status: DC

## 2021-02-04 MED ORDER — ATORVASTATIN CALCIUM 20 MG PO TABS
10.0000 mg | ORAL_TABLET | Freq: Every day | ORAL | Status: DC
  Administered 2021-02-04 – 2021-02-06 (×3): 10 mg via ORAL
  Filled 2021-02-04 (×3): qty 1

## 2021-02-04 MED ORDER — SENNOSIDES-DOCUSATE SODIUM 8.6-50 MG PO TABS: 1.0000 | ORAL_TABLET | Freq: Every evening | ORAL | Status: DC | PRN

## 2021-02-04 MED ORDER — NICOTINE 21 MG/24HR TD PT24
21.0000 mg | MEDICATED_PATCH | Freq: Once | TRANSDERMAL | Status: AC
  Administered 2021-02-04: 21 mg via TRANSDERMAL
  Filled 2021-02-04: qty 1

## 2021-02-04 MED ORDER — ACETAMINOPHEN 160 MG/5ML PO SOLN
650.0000 mg | ORAL | Status: DC | PRN
  Filled 2021-02-04: qty 20.3

## 2021-02-04 MED ORDER — ENOXAPARIN SODIUM 40 MG/0.4ML IJ SOSY
40.0000 mg | PREFILLED_SYRINGE | Freq: Every day | INTRAMUSCULAR | Status: DC
  Administered 2021-02-04 – 2021-02-05 (×2): 40 mg via SUBCUTANEOUS
  Filled 2021-02-04 (×2): qty 0.4

## 2021-02-04 MED ORDER — ACETAMINOPHEN 650 MG RE SUPP: 650.0000 mg | RECTAL | Status: DC | PRN

## 2021-02-04 NOTE — Progress Notes (Signed)
Patient rm 25 ED. Still being evaluated re Code Stroke. Will follow up.

## 2021-02-04 NOTE — ED Notes (Signed)
Pt ambulated to toilet and back with minimum assistance.

## 2021-02-04 NOTE — Consult Note (Signed)
NEURO HOSPITALIST CONSULT NOTE   Requesting physician: Dr. Vicente Males  Reason for Consult: Acute onset of left sided weakness  History obtained from:  Patient, Husband and Chart     HPI:                                                                                                                                          Holly Nicholson is an 36 y.o. female presenting to the ED from home with acute onset of left sided weakness and sensory numbness. TOSO was sometime between 3 and 3:30 PM, but patient is unable to further specify. LKN was 3 PM when husband left home to run an errand. When he came back at 3:30 PM, she was already symptomatic. Symptom onset was while playing with a baby at her house. All of a sudden she felt some chest pain. She though it was a tight bra, so she tried to take it off, but then felt the pain radiate under her chest to her shoulder which also felt numb. The sensation of numbness then spread down her LUE to her hand and then started to affect the left side of her face and her entire LLE. She describes the limbs becoming weak with the spreading numbness. She also felt as though the left side of her face was weak. Husband then decided to bring her to the ED with their own vehicle.   On arrival she had the above complaints, in addition to LUE pain on movement as well as onset of blurry vision en route to the hospital. She was A&OX4 and NAD, as well as able to ambulate to the Triage desk.   On ROS, she endorses a mild retroorbital headache bilaterally. No seizure activity. No recent blood loss, head trauma or surgery. No history of MI. History of vertigo, but none currently. Does not endorse any additional symptoms.  PMHx Gestational HTN Migraine headaches (last on 6 months ago)  PSHx "Tubes tied twice" Hysterectomy Gall bladder surgery Knee surgery  FHx DM HTN MI            SocHx Married  All NKDA  MEDICATIONS:  Naproxen PRN   ROS:                                                                                                                                       As per HPI.    Blood pressure (!) 137/94, pulse 82, temperature 98.3 F (36.8 C), temperature source Oral, resp. rate 19, SpO2 100 %.   General Examination:                                                                                                       Physical Exam  HEENT-  [place]/AT    Lungs- Respirations unlabored Extremities- No edema  Neurological Examination Mental Status:  Awake and alert. Oriented x 5. No dysarthria. Speech is fluent without evidence of aphasia.  Able to follow all commands without difficulty. Cranial Nerves: II: Temporal visual fields intact with no extinction to DSS. PERRL.   III,IV, VI: EOMI. No nystagmus. No ptosis.  V: Temp sensation subjectively absent on the left. Decreased FT on the left.  VII: Smile and cheek puff symmetric VIII: Hearing intact to conversation.  IX,X: No hypophonia or hoarseness XI: Symmetric shoulder shrug XII: Midline tongue extension Motor: RUE and RLE 5/5 proximally and distally LUE 4/5 with some giveway and coachability. Bobbing drift but does not hit the bed within 10 seconds  LLE 4/5 with some giveway and coachability. Bobbing drift but does not hit the bed within 5 seconds  Sensory: Subjectively decreased FT and temp sensation to LUE and LLE. No extinction to DSS Deep Tendon Reflexes: 2+ and symmetric bilateral brachioradialis, biceps, patellae and achilles. Toes downgoing bilaterally. Positive Hoffman's sign on the right with subtle Hoffman's on the left.  Cerebellar: No ataxia disproportionate to weakness   Gait: Deferred due to acuity of presentation   Lab Results: Basic Metabolic Panel: No results for input(s): NA, K, CL, CO2, GLUCOSE, BUN, CREATININE, CALCIUM, MG, PHOS in  the last 168 hours.  CBC: No results for input(s): WBC, NEUTROABS, HGB, HCT, MCV, PLT in the last 168 hours.  Cardiac Enzymes: No results for input(s): CKTOTAL, CKMB, CKMBINDEX, TROPONINI in the last 168 hours.  Lipid Panel: No results for input(s): CHOL, TRIG, HDL, CHOLHDL, VLDL, LDLCALC in the last 168 hours.  Imaging: No results found.  CT head:  No acute intracranial pathology. ASPECTS is 10  Assessment: 36 y.o. female with a history of migraines who presents to the ED from home with acute onset of left sided weakness and  sensory numbness. The patient does endorse having been under a lot of stress recently. She has no stroke risk factors.  1. Exam reveals left sided weakness and sensory deficit with some findings suggestive of either poor effort or embellishment.  2. CT head: No acute intracranial pathology. ASPECTS is 10 3. CTA of head and neck: Negative for LVO 4. CTP: Negative 5. Most likely components of the DDx, based on overall clinical presentation and imaging, are complicated migraine and stress-related symptoms. Low probability that her symptoms are due to TIA or stroke given negative imaging while still symptomatic. Possible benefits of TNK are significantly outweighed by risks.    Recommendations: 1. MRI brain 2. Frequent neuro checks 3. Migraine cocktail.  4. IVF 5. TTE 6. Cardiac telemetry 7. Given low likelihood of TIA, BP goal is recommended to be 120/80.     Electronically signed: Dr. Caryl Pina 02/04/2021, 5:45 PM

## 2021-02-04 NOTE — ED Notes (Signed)
MD Bradler at bedside °

## 2021-02-04 NOTE — H&P (Addendum)
[place]   PATIENT NAME: Holly Nicholson    MR#:  [id number]  DATE OF BIRTH:  1985-12-10  DATE OF ADMISSION:  02/04/2021  PRIMARY CARE PHYSICIAN: Pcp, No   Patient is coming from: Home  REQUESTING/REFERRING PHYSICIAN: Donna Bernard, MD  CHIEF COMPLAINT:   Chief Complaint  Patient presents with   Chest Pain   Numbness         HISTORY OF PRESENT ILLNESS:  Holly Nicholson is a 36 y.o. Caucasian female with medical history significant for IBS, ongoing tobacco abuse, gestational hypertension and bipolar disorder, presented to the emergency room with acute onset of midsternal chest pain as well as left upper and lower extremity numbness and left upper extremity weakness that started around 4:25 PM today.  She was noted to have slowed but not slurred speech by bystanders with no facial droop.  No tinnitus but she has chronic vertigo.  The patient symptoms have almost resolved in the ER.  No headache or dizziness.  She admits to mildly blurred vision without diplopia.  She describes her chest pain as midsternal chest pressure radiating to her left side as well as left arm with her left arm numbness and weakness.  She denied any dyspnea or cough or wheezing or hemoptysis or recent leg pain or travels or surgeries.  No fever or chills.  ED Course: Upon presentation to the ER BP was 137/94 with otherwise normal vital signs.  Labs revealed hypokalemia of 3.4 with otherwise normal CMP.  CBC was within normal.  High-sensitivity troponin I was 4. EKG as reviewed by me : EKG showed normal sinus rhythm with a rate of 98 with poor R wave progression.   Imaging: Noncontrasted head CT scan revealed no acute intracranial pathology. CTA of the neck with and without contrast revealed was normal.  The patient was given a nicotine patch.  Dr. Otelia Limes was consulted and evaluated the patient.  She will be admitted to a medical monitored observation bed for further evaluation and management. PAST  MEDICAL HISTORY:  Bipolar disorder, IBS, ongoing tobacco abuse and gestational hypertension.  PAST SURGICAL HISTORY:   Past Surgical History:  Procedure Laterality Date   ABDOMINAL HYSTERECTOMY     CHOLECYSTECTOMY     TUBAL LIGATION Bilateral     SOCIAL HISTORY:   Social History   Tobacco Use   Smoking status: Every Day    Packs/day: 0.50    Types: Cigarettes   Smokeless tobacco: Not on file  Substance Use Topics   Alcohol use: Not on file    FAMILY HISTORY:  Positive for MI, hypertension, diabetes mellitus and CVA.  DRUG ALLERGIES:  No Known Allergies  REVIEW OF SYSTEMS:   ROS As per history of present illness. All pertinent systems were reviewed above. Constitutional, HEENT, cardiovascular, respiratory, GI, GU, musculoskeletal, neuro, psychiatric, endocrine, integumentary and hematologic systems were reviewed and are otherwise negative/unremarkable except for positive findings mentioned above in the HPI.   MEDICATIONS AT HOME:   Prior to Admission medications   Not on File      VITAL SIGNS:  Blood pressure 127/67, pulse 74, temperature 98.3 F (36.8 C), temperature source Oral, resp. rate 16, SpO2 99 %.  PHYSICAL EXAMINATION:  Physical Exam  GENERAL:  36 y.o.-year-old Caucasian female patient lying in the bed with no acute distress.  EYES: Pupils equal, round, reactive to light and accommodation. No scleral icterus. Extraocular muscles intact.  HEENT: Head atraumatic, normocephalic. Oropharynx and nasopharynx clear.  NECK:  Supple, no jugular venous distention. No thyroid enlargement, no tenderness.  LUNGS: Normal breath sounds bilaterally, no wheezing, rales,rhonchi or crepitation. No use of accessory muscles of respiration.  CARDIOVASCULAR: Regular rate and rhythm, S1, S2 normal. No murmurs, rubs, or gallops.  ABDOMEN: Soft, nondistended, nontender. Bowel sounds present. No organomegaly or mass.  EXTREMITIES: No pedal edema, cyanosis, or clubbing.   NEUROLOGIC: Cranial nerves II through XII are intact. Muscle strength 5/5 in all extremities. Sensation intact. Gait not checked.  PSYCHIATRIC: The patient is alert and oriented x 3.  Normal affect and good eye contact. SKIN: No obvious rash, lesion, or ulcer.   LABORATORY PANEL:   CBC Recent Labs  Lab 02/04/21 1742  WBC 6.2  HGB 12.6  HCT 36.5  PLT 164   ------------------------------------------------------------------------------------------------------------------  Chemistries  Recent Labs  Lab 02/04/21 1742  NA 139  K 3.4*  CL 102  CO2 27  GLUCOSE 115*  BUN 8  CREATININE 0.97  CALCIUM 9.0  AST 24  ALT 31  ALKPHOS 67  BILITOT 1.0   ------------------------------------------------------------------------------------------------------------------  Cardiac Enzymes No results for input(s): TROPONINI in the last 168 hours. ------------------------------------------------------------------------------------------------------------------  RADIOLOGY:  CT HEAD CODE STROKE WO CONTRAST  Result Date: 02/04/2021 CLINICAL DATA:  Code stroke. Left-sided chest pain and left arm and leg numbness EXAM: CT HEAD WITHOUT CONTRAST TECHNIQUE: Contiguous axial images were obtained from the base of the skull through the vertex without intravenous contrast. RADIATION DOSE REDUCTION: This exam was performed according to the departmental dose-optimization program which includes automated exposure control, adjustment of the mA and/or kV according to patient size and/or use of iterative reconstruction technique. COMPARISON:  None. FINDINGS: Brain: There is no evidence of acute intracranial hemorrhage, extra-axial fluid collection, or acute infarct Parenchymal volume is normal. The ventricles are normal in size. Gray-white differentiation is preserved. There is no mass lesion. There is no midline shift. Vascular: No hyperdense vessel or unexpected calcification. Skull: Normal. Negative for fracture  or focal lesion. Sinuses/Orbits: There is mild mucosal thickening in the left maxillary sinus, incompletely imaged. The imaged globes and orbits are unremarkable. Other: None. ASPECTS [facility]([place] Stroke Program Early CT Score) - Ganglionic level infarction (caudate, lentiform nuclei, internal capsule, insula, M1-M3 cortex): 7 - Supraganglionic infarction (M4-M6 cortex): 3 Total score (0-10 with 10 being normal): 10 IMPRESSION: 1. No acute intracranial pathology. 2. ASPECTS is 10 These results were called by telephone at the time of interpretation on 02/04/2021 at 5:44 pm to provider [facility] , who verbally acknowledged these results. Electronically Signed   By: Lesia HausenPeter  Noone M.D.   On: 02/04/2021 17:47   CT ANGIO HEAD NECK W WO CM W PERF (CODE STROKE)  Result Date: 02/04/2021 CLINICAL DATA:  Left-sided weakness EXAM: CT ANGIOGRAPHY HEAD AND NECK CT PERFUSION BRAIN TECHNIQUE: Multidetector CT imaging of the head and neck was performed using the standard protocol during bolus administration of intravenous contrast. Multiplanar CT image reconstructions and MIPs were obtained to evaluate the vascular anatomy. Carotid stenosis measurements (when applicable) are obtained utilizing NASCET criteria, using the distal internal carotid diameter as the denominator. Multiphase CT imaging of the brain was performed following IV bolus contrast injection. Subsequent parametric perfusion maps were calculated using RAPID software. RADIATION DOSE REDUCTION: This exam was performed according to the departmental dose-optimization program which includes automated exposure control, adjustment of the mA and/or kV according to patient size and/or use of iterative reconstruction technique. CONTRAST:  75mL OMNIPAQUE IOHEXOL 350 MG/ML SOLN. An additional 40cc was  injected for the CT perfusion exam. COMPARISON:  None. FINDINGS: CTA NECK FINDINGS Aortic arch: The aortic arch is normal. The origins of the major branch vessels are patent. The  subclavian arteries are patent. Right carotid system: The right common, internal, and external carotid arteries are patent, without hemodynamically significant stenosis or occlusion. There is no dissection or aneurysm. Left carotid system: The left common, internal, and external carotid arteries are patent, without hemodynamically significant stenosis or occlusion. There is no dissection or aneurysm. Vertebral arteries: The vertebral arteries are patent, without hemodynamically significant stenosis or occlusion. There is no dissection or aneurysm. Skeleton: There is no acute osseous abnormality or aggressive osseous lesion. There is no visible canal hematoma. Other neck: The soft tissues are unremarkable. Upper chest: The imaged lung apices are clear. Review of the MIP images confirms the above findings CTA HEAD FINDINGS Anterior circulation: The intracranial ICAs are patent. The bilateral MCAs are patent. The bilateral ACAs are patent. There is no aneurysm or AVM. Posterior circulation: The bilateral V4 segments are patent. PICA is patent bilaterally. The basilar artery is patent. The bilateral PCAs are patent. The right posterior communicating artery is identified. There is no aneurysm or AVM. Venous sinuses: Patent. Anatomic variants: None. Review of the MIP images confirms the above findings CT Brain Perfusion Findings: CBF (<30%) Volume: 0mL Perfusion (Tmax>6.0s) volume: 0mL Mismatch Volume: 0mL Infarction Location:n/a IMPRESSION: 1. Normal CTA of the head and neck. 2. No infarct core or penumbra identified. Electronically Signed   By: Lesia HausenPeter  Noone M.D.   On: 02/04/2021 18:55      IMPRESSION AND PLAN:  Principal Problem:   TIA (transient ischemic attack)  1.  TIA with right-sided hemiparesis and numbness.  Differential diagnosis would include migraine. - The patient will be admitted to an observation medical monitored bed. - We will follow neurochecks every 4 hours for 24 hours. - She will be placed  on enteric-coated baby aspirin. - 2D echo with bubble study and brain MRI will be obtained in a.m. - Statin therapy be provided. - We will check fasting lipids in AM. - Neurology follow-up consult will be obtained. - Dr. Otelia LimesLindzen evaluated the patient in the ER. - PT/OT and ST consults will be obtained.  2.  Chest pain, rule out ACS. - We will follow serial troponins. - The patient will be placed on aspirin. - We will place her on as needed sublingual nitroglycerin and morphine sulfate for pain. - Cardiology consult to be obtained. - I notified Dr. Lalla BrothersLambert about the patient.  3.  Ongoing tobacco abuse. - I counseled her for smoking cessation and she will receive further counseling here.  4.  History of IBS. - She has no currently active or worsening symptoms.   DVT prophylaxis: Lovenox. Code Status: full code. Family Communication:  The plan of care was discussed in details with the patient (and family). I answered all questions. The patient agreed to proceed with the above mentioned plan. Further management will depend upon hospital course. Disposition Plan: Back to previous home environment Consults called: Neurology.   All the records are reviewed and case discussed with ED provider.  Status is: Observation  I certify that at the time of admission, it is my clinical judgment that the patient will require inpatient hospital care extending less than 2 midnights.                            Dispo: The patient  is from: Home              Anticipated d/c is to: Home              Patient currently is not medically stable to d/c.              Difficult to place patient: No  Hannah Beat M.D on 02/04/2021 at 8:40 PM  [facility]   From 7 PM-7 AM, contact night-coverage [email or web address]  CC: Primary care physician; Pcp, No

## 2021-02-04 NOTE — ED Provider Notes (Signed)
[facility] Provider Note    Event Date/Time   First MD Initiated Contact with Patient 02/04/21 1748     (approximate)   History   Chest Pain and Numbness (/)   HPI Holly Nicholson is a 36 y.o. female with a past medical history of bipolar disorder with depression who presents for left-sided chest pain, left upper and lower extremity numbness and left upper extremity weakness that began at approximately 1625 today and has been stable since onset.  Patient states that she sometimes feels that the sensation is coming back in her upper extremity but then goes away soon afterwards.  Patient states that bystanders told her that she had slowed speech but it was not slurred.  Denies any facial droop.  Patient denies ever having similar symptoms in the past.     Physical Exam   Triage Vital Signs: ED Triage Vitals [02/04/21 1728]  Enc Vitals Group     BP (!) 137/94     Pulse Rate 82     Resp 19     Temp 98.3 F (36.8 C)     Temp Source Oral     SpO2 100 %     Weight      Height      Head Circumference      Peak Flow      Pain Score      Pain Loc      Pain Edu?      Excl. in GC?     Most recent vital signs: Vitals:   02/04/21 2014 02/04/21 2205  BP: 127/67 113/62  Pulse: 74 75  Resp: 16 16  Temp:  97.8 F (36.6 C)  SpO2: 99% 97%    General: Awake, oriented x4 CV:  Good peripheral perfusion.  Resp:  Normal effort.  Abd:  No distention.  Other:   Interval: Initial (01/27 1748) Level of Consciousness (1a.)   : Alert, keenly responsive (01/27 1748) LOC Questions (1b. )   +: Answers both questions correctly (01/27 2015) LOC Commands (1c. )   + : Performs both tasks correctly (01/27 2015) Best Gaze (2. )  +: Normal (01/27 2015) Visual (3. )  +: No visual loss (01/27 2015) Facial Palsy (4. )    : Normal symmetrical movements (01/27 1748) Motor Arm, Left (5a. )   +: No drift (01/27 2015) Motor Arm, Right (5b. )   +: No drift (01/27 2015) Motor  Leg, Left (6a. )   +: No drift (01/27 2015) Motor Leg, Right (6b. )   +: No drift (01/27 2015) Limb Ataxia (7. ): Absent (01/27 1748) Sensory (8. )   +: Normal, no sensory loss (01/27 2015) Best Language (9. )   +: No aphasia (01/27 2015) Dysarthria (10. ): Normal (01/27 1748) Extinction/Inattention (11.)   +: No Abnormality (01/27 2015) Modified SS Total  +: 0 (01/27 2015) Complete NIHSS TOTAL: 5 (01/27 1748)   ED Results / Procedures / Treatments   Labs (all labs ordered are listed, but only abnormal results are displayed) Labs Reviewed  COMPREHENSIVE METABOLIC PANEL - Abnormal; Notable for the following components:      Result Value   Potassium 3.4 (*)    Glucose, Bld 115 (*)    All other components within normal limits  RESP PANEL BY RT-PCR (FLU A&B, COVID) ARPGX2  PROTIME-INR  APTT  CBC  DIFFERENTIAL  HIV ANTIBODY (ROUTINE TESTING W REFLEX)  HEMOGLOBIN A1C  LIPID PANEL  CBG MONITORING, ED  CBG MONITORING, ED  POC URINE PREG, ED  TROPONIN I (HIGH SENSITIVITY)  TROPONIN I (HIGH SENSITIVITY)  TROPONIN I (HIGH SENSITIVITY)     EKG ED ECG REPORT I, Merwyn KatosEvan K Kazuma Elena, the attending physician, personally viewed and interpreted this ECG.  Date: 02/04/2021 EKG Time: 1834 Rate: 98 Rhythm: normal sinus rhythm QRS Axis: normal Intervals: normal ST/T Wave abnormalities: normal Narrative Interpretation: no evidence of acute ischemia   RADIOLOGY ED MD interpretation: CT of the head without contrast shows no acute intracranial pathology with an aspect score of 10.  Agree with radiology assessment  Official radiology report(s): CT HEAD CODE STROKE WO CONTRAST  Result Date: 02/04/2021 CLINICAL DATA:  Code stroke. Left-sided chest pain and left arm and leg numbness EXAM: CT HEAD WITHOUT CONTRAST TECHNIQUE: Contiguous axial images were obtained from the base of the skull through the vertex without intravenous contrast. RADIATION DOSE REDUCTION: This exam was performed  according to the departmental dose-optimization program which includes automated exposure control, adjustment of the mA and/or kV according to patient size and/or use of iterative reconstruction technique. COMPARISON:  None. FINDINGS: Brain: There is no evidence of acute intracranial hemorrhage, extra-axial fluid collection, or acute infarct Parenchymal volume is normal. The ventricles are normal in size. Gray-white differentiation is preserved. There is no mass lesion. There is no midline shift. Vascular: No hyperdense vessel or unexpected calcification. Skull: Normal. Negative for fracture or focal lesion. Sinuses/Orbits: There is mild mucosal thickening in the left maxillary sinus, incompletely imaged. The imaged globes and orbits are unremarkable. Other: None. ASPECTS [facility]([place] Stroke Program Early CT Score) - Ganglionic level infarction (caudate, lentiform nuclei, internal capsule, insula, M1-M3 cortex): 7 - Supraganglionic infarction (M4-M6 cortex): 3 Total score (0-10 with 10 being normal): 10 IMPRESSION: 1. No acute intracranial pathology. 2. ASPECTS is 10 These results were called by telephone at the time of interpretation on 02/04/2021 at 5:44 pm to provider [facility] Kray[facility] Wort[facility] , who verbally acknowledged these results. Electronically Signed   By: Lesia HausenPeter  Noone M.D.   On: 02/04/2021 17:47   CT ANGIO HEAD NECK W WO CM W PERF (CODE STROKE)  Result Date: 02/04/2021 CLINICAL DATA:  Left-sided weakness EXAM: CT ANGIOGRAPHY HEAD AND NECK CT PERFUSION BRAIN TECHNIQUE: Multidetector CT imaging of the head and neck was performed using the standard protocol during bolus administration of intravenous contrast. Multiplanar CT image reconstructions and MIPs were obtained to evaluate the vascular anatomy. Carotid stenosis measurements (when applicable) are obtained utilizing NASCET criteria, using the distal internal carotid diameter as the denominator. Multiphase CT imaging of the brain was performed following IV bolus  contrast injection. Subsequent parametric perfusion maps were calculated using RAPID software. RADIATION DOSE REDUCTION: This exam was performed according to the departmental dose-optimization program which includes automated exposure control, adjustment of the mA and/or kV according to patient size and/or use of iterative reconstruction technique. CONTRAST:  75mL OMNIPAQUE IOHEXOL 350 MG/ML SOLN. An additional 40cc was injected for the CT perfusion exam. COMPARISON:  None. FINDINGS: CTA NECK FINDINGS Aortic arch: The aortic arch is normal. The origins of the major branch vessels are patent. The subclavian arteries are patent. Right carotid system: The right common, internal, and external carotid arteries are patent, without hemodynamically significant stenosis or occlusion. There is no dissection or aneurysm. Left carotid system: The left common, internal, and external carotid arteries are patent, without hemodynamically significant stenosis or occlusion. There is no dissection or aneurysm. Vertebral arteries: The vertebral arteries are patent, without hemodynamically  significant stenosis or occlusion. There is no dissection or aneurysm. Skeleton: There is no acute osseous abnormality or aggressive osseous lesion. There is no visible canal hematoma. Other neck: The soft tissues are unremarkable. Upper chest: The imaged lung apices are clear. Review of the MIP images confirms the above findings CTA HEAD FINDINGS Anterior circulation: The intracranial ICAs are patent. The bilateral MCAs are patent. The bilateral ACAs are patent. There is no aneurysm or AVM. Posterior circulation: The bilateral V4 segments are patent. PICA is patent bilaterally. The basilar artery is patent. The bilateral PCAs are patent. The right posterior communicating artery is identified. There is no aneurysm or AVM. Venous sinuses: Patent. Anatomic variants: None. Review of the MIP images confirms the above findings CT Brain Perfusion Findings:  CBF (<30%) Volume: 0mL Perfusion (Tmax>6.0s) volume: 0mL Mismatch Volume: 0mL Infarction Location:n/a IMPRESSION: 1. Normal CTA of the head and neck. 2. No infarct core or penumbra identified. Electronically Signed   By: Lesia HausenPeter  Noone M.D.   On: 02/04/2021 18:55      PROCEDURES:  Critical Care performed: No  Procedures   MEDICATIONS ORDERED IN ED: Medications  nicotine (NICODERM CQ - dosed in mg/24 hours) patch 21 mg (21 mg Transdermal Patch Applied 02/04/21 2012)   stroke: mapping our early stages of recovery book ( Does not apply Not Given 02/04/21 2059)  0.9 %  sodium chloride infusion ( Intravenous New Bag/Given 02/04/21 2110)  acetaminophen (TYLENOL) tablet 650 mg (650 mg Oral Given 02/04/21 2128)    Or  acetaminophen (TYLENOL) 160 MG/5ML solution 650 mg ( Per Tube See Alternative 02/04/21 2128)    Or  acetaminophen (TYLENOL) suppository 650 mg ( Rectal See Alternative 02/04/21 2128)  senna-docusate (Senokot-S) tablet 1 tablet (has no administration in time range)  enoxaparin (LOVENOX) injection 40 mg (40 mg Subcutaneous Given 02/04/21 2128)  aspirin EC tablet 81 mg (has no administration in time range)  nitroGLYCERIN (NITROSTAT) SL tablet 0.4 mg (has no administration in time range)  morphine 2 MG/ML injection 2 mg (has no administration in time range)  atorvastatin (LIPITOR) tablet 10 mg (10 mg Oral Given 02/04/21 2256)  sodium chloride flush (NS) 0.9 % injection 3 mL (3 mLs Intravenous Given 02/04/21 1807)  iohexol (OMNIPAQUE) 350 MG/ML injection 75 mL (75 mLs Intravenous Contrast Given 02/04/21 1816)     IMPRESSION / MDM / ASSESSMENT AND PLAN / ED COURSE  I reviewed the triage vital signs and the nursing notes.                              The patient is on the cardiac monitor to evaluate for evidence of arrhythmia and/or significant heart rate changes. Patient is a 36 year old female who presents with symptoms concerning for TIA PMH risk factors: None Neurologic Deficits: Left  upper and lower extremity weakness/numbness Last known Well Time: 1625 NIH Stroke Score: 5 Given History and Exam I have lower suspicion for infectious etiology, neurologic changes secondary to toxicologic ingestion, seizure, complex migraine. Presentation concerning for possible stroke requiring workup.  Workup: Labs: POC glucose, CBC, BMP, LFTs, Troponin, PT/INR, PTT, Type and Screen Other Diagnostics: ECG, CXR, non-contrast head CT followed by CTA brain and neck (to r/o large vessel occlusion amenable to thrombectomy) Interventions: Patient's not eligible for TPA due to resolution of symptoms prior to evaluation  Consult: hospitalist Disposition: Admit       FINAL CLINICAL IMPRESSION(S) / ED DIAGNOSES   Final diagnoses:  Numbness  Left-sided weakness     Rx / DC Orders   ED Discharge Orders     None        Note:  This document was prepared using Dragon voice recognition software and may include unintentional dictation errors.   Merwyn Katos, MD 02/04/21 2325

## 2021-02-04 NOTE — Progress Notes (Signed)
Patient rm 25 ED. Evaluated re Code Stroke. No stroke, being held overnight for observation. Ch sat with husband as pt was scanned.

## 2021-02-04 NOTE — ED Triage Notes (Addendum)
Pt via POV from home. Pt c/o L sided CP and L arm and leg numbness.  No facial droop noted. Pt states she is having weakness in her L arm. Pt states it is painful to move her left arm. Pt states she also started having some blurry vision on the way to the hospital. Pt is A&OX4 and NAD.  Pt is ambulatory to triage. LKW 4:25pm

## 2021-02-04 NOTE — ED Notes (Signed)
Activated code stroke to [place]  1733

## 2021-02-05 ENCOUNTER — Other Ambulatory Visit: Payer: Self-pay

## 2021-02-05 ENCOUNTER — Observation Stay: Admit: 2021-02-05 | Payer: BC Managed Care – PPO

## 2021-02-05 ENCOUNTER — Encounter: Payer: Self-pay | Admitting: Family Medicine

## 2021-02-05 ENCOUNTER — Observation Stay (HOSPITAL_BASED_OUTPATIENT_CLINIC_OR_DEPARTMENT_OTHER): Payer: BC Managed Care – PPO

## 2021-02-05 DIAGNOSIS — R0789 Other chest pain: Secondary | ICD-10-CM | POA: Diagnosis not present

## 2021-02-05 DIAGNOSIS — R9431 Abnormal electrocardiogram [ECG] [EKG]: Secondary | ICD-10-CM

## 2021-02-05 DIAGNOSIS — G459 Transient cerebral ischemic attack, unspecified: Secondary | ICD-10-CM | POA: Diagnosis not present

## 2021-02-05 DIAGNOSIS — R079 Chest pain, unspecified: Secondary | ICD-10-CM | POA: Diagnosis not present

## 2021-02-05 LAB — NM MYOCAR MULTI W/SPECT W/WALL MOTION / EF
LV dias vol: 84 mL (ref 46–106)
LV sys vol: 24 mL
MPHR: 183 {beats}/min
Nuc Stress EF: 71 %
Peak HR: 114 {beats}/min
Percent HR: 62 %
Rest HR: 89 {beats}/min
Rest Nuclear Isotope Dose: 10.2 mCi
SDS: 1
SRS: 0
SSS: 0
Stress Nuclear Isotope Dose: 31.6 mCi
TID: 1.01

## 2021-02-05 LAB — LIPID PANEL
Cholesterol: 138 mg/dL (ref 0–200)
HDL: 36 mg/dL — ABNORMAL LOW (ref >40)
LDL Cholesterol: 62 mg/dL (ref 0–99)
Total CHOL/HDL Ratio: 3.8 RATIO
Triglycerides: 198 mg/dL — ABNORMAL HIGH (ref <150)
VLDL: 40 mg/dL (ref 0–40)

## 2021-02-05 LAB — BASIC METABOLIC PANEL
Anion gap: 7 (ref 5–15)
BUN: 9 mg/dL (ref 6–20)
CO2: 26 mmol/L (ref 22–32)
Calcium: 9.4 mg/dL (ref 8.9–10.3)
Chloride: 103 mmol/L (ref 98–111)
Creatinine, Ser: 0.77 mg/dL (ref 0.44–1.00)
GFR, Estimated: >60 mL/min (ref >60)
Glucose, Bld: 99 mg/dL (ref 70–99)
Potassium: 3.7 mmol/L (ref 3.5–5.1)
Sodium: 136 mmol/L (ref 135–145)

## 2021-02-05 LAB — MAGNESIUM: Magnesium: 2.1 mg/dL (ref 1.7–2.4)

## 2021-02-05 LAB — HIV ANTIBODY (ROUTINE TESTING W REFLEX): HIV Screen 4th Generation wRfx: NONREACTIVE

## 2021-02-05 LAB — TROPONIN I (HIGH SENSITIVITY): Troponin I (High Sensitivity): 4 ng/L (ref <18)

## 2021-02-05 MED ORDER — ASPIRIN 81 MG PO TBEC: 81.0000 mg | DELAYED_RELEASE_TABLET | Freq: Every day | ORAL | 11 refills | Status: DC

## 2021-02-05 MED ORDER — NITROGLYCERIN 0.4 MG SL SUBL: 0.4000 mg | SUBLINGUAL_TABLET | SUBLINGUAL | 0 refills | Status: DC | PRN

## 2021-02-05 MED ORDER — TECHNETIUM TC 99M TETROFOSMIN IV KIT
10.0000 | PACK | Freq: Once | INTRAVENOUS | Status: AC | PRN
  Administered 2021-02-05: 10.2 via INTRAVENOUS

## 2021-02-05 MED ORDER — ATORVASTATIN CALCIUM 10 MG PO TABS: 10.0000 mg | ORAL_TABLET | Freq: Every day | ORAL | 0 refills | Status: DC

## 2021-02-05 MED ORDER — TECHNETIUM TC 99M TETROFOSMIN IV KIT
30.0000 | PACK | Freq: Once | INTRAVENOUS | Status: AC | PRN
  Administered 2021-02-05: 31.6 via INTRAVENOUS

## 2021-02-05 MED ORDER — REGADENOSON 0.4 MG/5ML IV SOLN
0.4000 mg | Freq: Once | INTRAVENOUS | Status: AC
  Administered 2021-02-05: 0.4 mg via INTRAVENOUS

## 2021-02-05 NOTE — Progress Notes (Signed)
[facility] [place] NAME: Dreyah Minkler    MR#:  [id number]  DATE OF BIRTH:  09/06/1985  SUBJECTIVE:  patient has on and off chronic headaches. She takes naproxen PRN for it. Came in with left sided weakness which is resolved. Able to ambulate around without any difficulty. Tolerating PO diet. Did have chest pain yesterday but denies any today.  REVIEW OF SYSTEMS:   Review of Systems  Constitutional:  Negative for chills, fever and weight loss.  HENT:  Negative for ear discharge, ear pain and nosebleeds.   Eyes:  Negative for blurred vision, pain and discharge.  Respiratory:  Negative for sputum production, shortness of breath, wheezing and stridor.   Cardiovascular:  Negative for chest pain, palpitations, orthopnea and PND.  Gastrointestinal:  Negative for abdominal pain, diarrhea, nausea and vomiting.  Genitourinary:  Negative for frequency and urgency.  Musculoskeletal:  Negative for back pain and joint pain.  Neurological:  Positive for headaches. Negative for sensory change, speech change, focal weakness and weakness.  Psychiatric/Behavioral:  Negative for depression and hallucinations. The patient is not nervous/anxious.   Tolerating Diet: Tolerating PT:   DRUG ALLERGIES:  No Known Allergies  VITALS:  Blood pressure 115/77, pulse 83, temperature 98.2 F (36.8 C), temperature source Oral, resp. rate 19, height 5\' 1"  (1.549 m), weight 108.9 kg, SpO2 99 %.  PHYSICAL EXAMINATION:   Physical Exam  GENERAL:  36 y.o.-year-old patient lying in the bed with no acute distress.  HEENT: Head atraumatic, normocephalic. Oropharynx and nasopharynx clear.  LUNGS: Normal breath sounds bilaterally, no wheezing, rales, rhonchi.  CARDIOVASCULAR: S1, S2 normal. No murmurs, rubs, or gallops.  ABDOMEN: Soft, nontender, nondistended. Bowel sounds present.  EXTREMITIES: No  edema b/l.    NEUROLOGIC: nonfocal PSYCHIATRIC:  patient is alert and  awake SKIN: No obvious rash, lesion, or ulcer.   LABORATORY PANEL:  CBC Recent Labs  Lab 02/04/21 1742  WBC 6.2  HGB 12.6  HCT 36.5  PLT 164    Chemistries  Recent Labs  Lab 02/04/21 1742  NA 139  K 3.4*  CL 102  CO2 27  GLUCOSE 115*  BUN 8  CREATININE 0.97  CALCIUM 9.0  AST 24  ALT 31  ALKPHOS 67  BILITOT 1.0   Cardiac Enzymes No results for input(s): TROPONINI in the last 168 hours. RADIOLOGY:  MR BRAIN WO CONTRAST  Result Date: 02/05/2021 CLINICAL DATA:  Left arm and leg numbness EXAM: MRI HEAD WITHOUT CONTRAST TECHNIQUE: Multiplanar, multiecho pulse sequences of the brain and surrounding structures were obtained without intravenous contrast. COMPARISON:  None. FINDINGS: Brain: No acute infarct, mass effect or extra-axial collection. No acute or chronic hemorrhage. Normal white matter signal, parenchymal volume and CSF spaces. The midline structures are normal. Vascular: Major flow voids are preserved. Skull and upper cervical spine: Normal calvarium and skull base. Visualized upper cervical spine and soft tissues are normal. Sinuses/Orbits:No paranasal sinus fluid levels or advanced mucosal thickening. No mastoid or middle ear effusion. Normal orbits. IMPRESSION: Normal brain MRI. Electronically Signed   By: Ulyses Jarred M.D.   On: 02/05/2021 00:25   CT HEAD CODE STROKE WO CONTRAST  Result Date: 02/04/2021 CLINICAL DATA:  Code stroke. Left-sided chest pain and left arm and leg numbness EXAM: CT HEAD WITHOUT CONTRAST TECHNIQUE: Contiguous axial images were obtained from the base of the skull through the vertex without intravenous contrast. RADIATION DOSE REDUCTION: This exam was performed according to the departmental dose-optimization  program which includes automated exposure control, adjustment of the mA and/or kV according to patient size and/or use of iterative reconstruction technique. COMPARISON:  None. FINDINGS: Brain: There is no evidence of acute intracranial  hemorrhage, extra-axial fluid collection, or acute infarct Parenchymal volume is normal. The ventricles are normal in size. Gray-white differentiation is preserved. There is no mass lesion. There is no midline shift. Vascular: No hyperdense vessel or unexpected calcification. Skull: Normal. Negative for fracture or focal lesion. Sinuses/Orbits: There is mild mucosal thickening in the left maxillary sinus, incompletely imaged. The imaged globes and orbits are unremarkable. Other: None. ASPECTS [facility] Stroke Program Early CT Score) - Ganglionic level infarction (caudate, lentiform nuclei, internal capsule, insula, M1-M3 cortex): 7 - Supraganglionic infarction (M4-M6 cortex): 3 Total score (0-10 with 10 being normal): 10 IMPRESSION: 1. No acute intracranial pathology. 2. ASPECTS is 10 These results were called by telephone at the time of interpretation on 02/04/2021 at 5:44 pm to provider [facility] , who verbally acknowledged these results. Electronically Signed   By: Valetta Mole M.D.   On: 02/04/2021 17:47   CT ANGIO HEAD NECK W WO CM W PERF (CODE STROKE)  Result Date: 02/04/2021 CLINICAL DATA:  Left-sided weakness EXAM: CT ANGIOGRAPHY HEAD AND NECK CT PERFUSION BRAIN TECHNIQUE: Multidetector CT imaging of the head and neck was performed using the standard protocol during bolus administration of intravenous contrast. Multiplanar CT image reconstructions and MIPs were obtained to evaluate the vascular anatomy. Carotid stenosis measurements (when applicable) are obtained utilizing NASCET criteria, using the distal internal carotid diameter as the denominator. Multiphase CT imaging of the brain was performed following IV bolus contrast injection. Subsequent parametric perfusion maps were calculated using RAPID software. RADIATION DOSE REDUCTION: This exam was performed according to the departmental dose-optimization program which includes automated exposure control, adjustment of the mA and/or kV according to  patient size and/or use of iterative reconstruction technique. CONTRAST:  62mL OMNIPAQUE IOHEXOL 350 MG/ML SOLN. An additional 40cc was injected for the CT perfusion exam. COMPARISON:  None. FINDINGS: CTA NECK FINDINGS Aortic arch: The aortic arch is normal. The origins of the major branch vessels are patent. The subclavian arteries are patent. Right carotid system: The right common, internal, and external carotid arteries are patent, without hemodynamically significant stenosis or occlusion. There is no dissection or aneurysm. Left carotid system: The left common, internal, and external carotid arteries are patent, without hemodynamically significant stenosis or occlusion. There is no dissection or aneurysm. Vertebral arteries: The vertebral arteries are patent, without hemodynamically significant stenosis or occlusion. There is no dissection or aneurysm. Skeleton: There is no acute osseous abnormality or aggressive osseous lesion. There is no visible canal hematoma. Other neck: The soft tissues are unremarkable. Upper chest: The imaged lung apices are clear. Review of the MIP images confirms the above findings CTA HEAD FINDINGS Anterior circulation: The intracranial ICAs are patent. The bilateral MCAs are patent. The bilateral ACAs are patent. There is no aneurysm or AVM. Posterior circulation: The bilateral V4 segments are patent. PICA is patent bilaterally. The basilar artery is patent. The bilateral PCAs are patent. The right posterior communicating artery is identified. There is no aneurysm or AVM. Venous sinuses: Patent. Anatomic variants: None. Review of the MIP images confirms the above findings CT Brain Perfusion Findings: CBF (<30%) Volume: 35mL Perfusion (Tmax>6.0s) volume: 45mL Mismatch Volume: 37mL Infarction Location:n/a IMPRESSION: 1. Normal CTA of the head and neck. 2. No infarct core or penumbra identified. Electronically Signed   By: Collier Salina  Noone M.D.   On: 02/04/2021 18:55   ASSESSMENT AND PLAN:   BRYNNLEE WITHEE is a 36 y.o. Caucasian female with medical history significant for IBS, ongoing tobacco abuse, gestational hypertension and bipolar disorder, presented to the emergency room with acute onset of midsternal chest pain as well as left upper and lower extremity numbness and left upper extremity weakness.  EKG as reviewed by me : EKG showed normal sinus rhythm with a rate of 98 with poor R wave progression.   Imaging: Noncontrasted head CT scan revealed no acute intracranial pathology. CTA of the neck with and without contrast revealed was normal.  MRI brain negative  Left-sided numbness with headache with history of migraine headaches -- MRI negative for stroke -- neurology consult noted--"Most likely components of the DDx, based on overall clinical presentation and imaging, are complicated migraine and stress-related symptoms. Low probability that her symptoms are due to TIA or stroke given negative imaging while still symptomatic" -- continue aspirin -- continue statins -- patient does not have any neuro- deficits today  chest pain with negative troponin -- no cardiac history -- patient underwent stress test per cardiology recommendation. [facility] cardiology Dr. Caryl Comes noted patient has prolonged QT on stress EKG -- he recommends repeat labs and another stress EKG  tobacco abuse -- recommend smoking cessation  Procedures: Family communication : Consults : CODE STATUS:  DVT Prophylaxis : Level of care: Med-Surg Status is: Observation  The patient remains OBS appropriate and will d/c before 2 midnights.       TOTAL TIME TAKING CARE OF THIS PATIENT: 25 minutes.  >50% time spent on counselling and coordination of care  Note: This dictation was prepared with Dragon dictation along with smaller phrase technology. Any transcriptional errors that result from this process are unintentional.  Fritzi Mandes M.D    [facility]   CC: Primary care physician; Pcp, No  Patient ID: Yates Decamp, female   DOB: 1985/05/21, 36 y.o.   MRN: [id number]

## 2021-02-05 NOTE — Progress Notes (Signed)
PT Cancellation Note  Patient Details Name: Holly Nicholson MRN: [id number] DOB: Dec 15, 1985   Cancelled Treatment:    Reason Eval/Treat Not Completed: Other (comment);Patient at procedure or test/unavailable. PT attempted twice, pt out of room. To re-attempt as able.   Olga Coaster PT, DPT 9:52 AM,02/05/21

## 2021-02-05 NOTE — Discharge Instructions (Addendum)
Patient advised to establish PCP in the area. Advised to see a headache specialist/neurologist for her chronic migraine  [email or web address] -website per Dr Graciela Husbands

## 2021-02-05 NOTE — Consult Note (Addendum)
Cardiology Consultation:   Patient ID: Holly Nicholson MRN: [id number]; DOB: 01-05-1986  Admit date: 02/04/2021 Date of Consult: 02/05/2021  PCP:  Holly Nicholson, No   [facility] Providers Cardiologist:New  Patient Profile:   Holly Nicholson is a 36 y.o. female with a hx of tobacco use, IBS, gestational HTN, biporla disorder who is being seen 02/05/2021 for the evaluation of chest pain at the request of Dr. Allena Nicholson.  History of Present Illness:   Holly Nicholson has no prior cardiac history. No prior MI, stent, or echo. She smokes 1/2 ppd. Family history positive for heart HTN and heart problems on both sides.   The patient presented to the ER for chest pain, radiating down the arm. Vision starting going blurry, hard to focus. Then some numbness in the left leg. Chest pain is under the left breast described as a pressure. Started while ar rest. Pain lasted about 4 hours. Resolved on it's own. Never had this pain before.Worse with movement. No associated symptoms. She has intermittent LLE, orthopnea. Says has some trouble breathing at night. No recent fever or chills.   In the ER BP 137/94, pulse 82, RR 19, afebrile, normal O2. Labs showed K3.4, otherwise unremarkable. HS trop 4>4. CT head showed no acute process. CTA of the neck with and without contrast was normal. EKG showed NSR 84bpm, nonspecific ST/t wave changes.  No active pain.   History reviewed. No pertinent past medical history.  Past Surgical History:  Procedure Laterality Date   ABDOMINAL HYSTERECTOMY     CHOLECYSTECTOMY     TUBAL LIGATION Bilateral      Home Medications:  Prior to Admission medications   Not on File    Inpatient Medications: Scheduled Meds:   stroke: mapping our early stages of recovery book   Does not apply Once   aspirin EC  81 mg Oral Daily   atorvastatin  10 mg Oral Daily   enoxaparin (LOVENOX) injection  40 mg Subcutaneous QHS   nicotine  21 mg Transdermal Once   Continuous Infusions:  sodium  chloride Stopped (02/05/21 0100)   PRN Meds: acetaminophen **OR** acetaminophen (TYLENOL) oral liquid 160 mg/5 mL **OR** acetaminophen, morphine injection, nitroGLYCERIN, senna-docusate  Allergies:   No Known Allergies  Social History:   Social History   Socioeconomic History   Marital status: Married    Spouse name: Not on file   Number of children: Not on file   Years of education: Not on file   Highest education level: Not on file  Occupational History   Not on file  Tobacco Use   Smoking status: Every Day    Packs/day: 0.50    Types: Cigarettes   Smokeless tobacco: Never  Substance and Sexual Activity   Alcohol use: Not on file   Drug use: Not on file   Sexual activity: Not on file  Other Topics Concern   Not on file  Social History Narrative   Not on file   Social Determinants of Health   Financial Resource Strain: Not on file  Food Insecurity: Not on file  Transportation Needs: Not on file  Physical Activity: Not on file  Stress: Not on file  Social Connections: Not on file  Intimate Partner Violence: Not on file    Family History:   History reviewed. No pertinent family history.   ROS:  Please see the history of present illness.   All other ROS reviewed and negative.     Physical Exam/Data:   Vitals:  02/04/21 2014 02/04/21 2110 02/04/21 2205 02/05/21 0430  BP: 127/67  113/62 118/68  Pulse: 74  75 84  Resp: 16  16   Temp:   97.8 F (36.6 C) 97.8 F (36.6 C)  TempSrc:    Oral  SpO2: 99%  97% 98%  Weight:  108.9 kg    Height:  5\' 1"  (1.549 m)      Intake/Output Summary (Last 24 hours) at 02/05/2021 0709 Last data filed at 02/05/2021 0100 Gross per 24 hour  Intake 383.33 ml  Output --  Net 383.33 ml   Last 3 Weights 02/04/2021  Weight (lbs) 240 lb  Weight (kg) 108.863 kg     Body mass index is 45.35 kg/m.  General:  Well nourished, well developed, in no acute distress HEENT: normal Neck: no JVD Vascular: No carotid bruits; Distal  pulses 2+ bilaterally Cardiac:  normal S1, S2; RRR; no murmur  Lungs:  clear to auscultation bilaterally, no wheezing, rhonchi or rales  Abd: soft, nontender, no hepatomegaly  Ext: no edema Musculoskeletal:  No deformities, BUE and BLE strength normal and equal Skin: warm and dry  Neuro:  CNs 2-12 intact, no focal abnormalities noted Psych:  Normal affect   EKG:  The EKG was personally reviewed and demonstrates:  NSR 89bpm, nonspecific ST/ wave changes Telemetry:  Telemetry was personally reviewed and demonstrates:  NSR, HR 60s  Relevant CV Studies:  Echo bubble study pending  Laboratory Data:  High Sensitivity Troponin:   Recent Labs  Lab 02/04/21 1923 02/04/21 2222 02/05/21 0223  TROPONINIHS 4 4 4      Chemistry Recent Labs  Lab 02/04/21 1742  NA 139  K 3.4*  CL 102  CO2 27  GLUCOSE 115*  BUN 8  CREATININE 0.97  CALCIUM 9.0  GFRNONAA >60  ANIONGAP 10    Recent Labs  Lab 02/04/21 1742  PROT 6.8  ALBUMIN 4.0  AST 24  ALT 31  ALKPHOS 67  BILITOT 1.0   Lipids  Recent Labs  Lab 02/05/21 0223  CHOL 138  TRIG 198*  HDL 36*  LDLCALC 62  CHOLHDL 3.8    Hematology Recent Labs  Lab 02/04/21 1742  WBC 6.2  RBC 4.10  HGB 12.6  HCT 36.5  MCV 89.0  MCH 30.7  MCHC 34.5  RDW 12.8  PLT 164   Thyroid No results for input(s): TSH, FREET4 in the last 168 hours.  BNPNo results for input(s): BNP, PROBNP in the last 168 hours.  DDimer No results for input(s): DDIMER in the last 168 hours.   Radiology/Studies:  MR BRAIN WO CONTRAST  Result Date: 02/05/2021 CLINICAL DATA:  Left arm and leg numbness EXAM: MRI HEAD WITHOUT CONTRAST TECHNIQUE: Multiplanar, multiecho pulse sequences of the brain and surrounding structures were obtained without intravenous contrast. COMPARISON:  None. FINDINGS: Brain: No acute infarct, mass effect or extra-axial collection. No acute or chronic hemorrhage. Normal white matter signal, parenchymal volume and CSF spaces. The midline  structures are normal. Vascular: Major flow voids are preserved. Skull and upper cervical spine: Normal calvarium and skull base. Visualized upper cervical spine and soft tissues are normal. Sinuses/Orbits:No paranasal sinus fluid levels or advanced mucosal thickening. No mastoid or middle ear effusion. Normal orbits. IMPRESSION: Normal brain MRI. Electronically Signed   By: 02/06/21 M.D.   On: 02/05/2021 00:25   CT HEAD CODE STROKE WO CONTRAST  Result Date: 02/04/2021 CLINICAL DATA:  Code stroke. Left-sided chest pain and left arm and leg numbness EXAM:  CT HEAD WITHOUT CONTRAST TECHNIQUE: Contiguous axial images were obtained from the base of the skull through the vertex without intravenous contrast. RADIATION DOSE REDUCTION: This exam was performed according to the departmental dose-optimization program which includes automated exposure control, adjustment of the mA and/or kV according to patient size and/or use of iterative reconstruction technique. COMPARISON:  None. FINDINGS: Brain: There is no evidence of acute intracranial hemorrhage, extra-axial fluid collection, or acute infarct Parenchymal volume is normal. The ventricles are normal in size. Gray-white differentiation is preserved. There is no mass lesion. There is no midline shift. Vascular: No hyperdense vessel or unexpected calcification. Skull: Normal. Negative for fracture or focal lesion. Sinuses/Orbits: There is mild mucosal thickening in the left maxillary sinus, incompletely imaged. The imaged globes and orbits are unremarkable. Other: None. ASPECTS [facility]([place] Stroke Program Early CT Score) - Ganglionic level infarction (caudate, lentiform nuclei, internal capsule, insula, M1-M3 cortex): 7 - Supraganglionic infarction (M4-M6 cortex): 3 Total score (0-10 with 10 being normal): 10 IMPRESSION: 1. No acute intracranial pathology. 2. ASPECTS is 10 These results were called by telephone at the time of interpretation on 02/04/2021 at 5:44 pm to  provider [facility] , who verbally acknowledged these results. Electronically Signed   By: Lesia HausenPeter  Noone M.D.   On: 02/04/2021 17:47   CT ANGIO HEAD NECK W WO CM W PERF (CODE STROKE)  Result Date: 02/04/2021 CLINICAL DATA:  Left-sided weakness EXAM: CT ANGIOGRAPHY HEAD AND NECK CT PERFUSION BRAIN TECHNIQUE: Multidetector CT imaging of the head and neck was performed using the standard protocol during bolus administration of intravenous contrast. Multiplanar CT image reconstructions and MIPs were obtained to evaluate the vascular anatomy. Carotid stenosis measurements (when applicable) are obtained utilizing NASCET criteria, using the distal internal carotid diameter as the denominator. Multiphase CT imaging of the brain was performed following IV bolus contrast injection. Subsequent parametric perfusion maps were calculated using RAPID software. RADIATION DOSE REDUCTION: This exam was performed according to the departmental dose-optimization program which includes automated exposure control, adjustment of the mA and/or kV according to patient size and/or use of iterative reconstruction technique. CONTRAST:  75mL OMNIPAQUE IOHEXOL 350 MG/ML SOLN. An additional 40cc was injected for the CT perfusion exam. COMPARISON:  None. FINDINGS: CTA NECK FINDINGS Aortic arch: The aortic arch is normal. The origins of the major branch vessels are patent. The subclavian arteries are patent. Right carotid system: The right common, internal, and external carotid arteries are patent, without hemodynamically significant stenosis or occlusion. There is no dissection or aneurysm. Left carotid system: The left common, internal, and external carotid arteries are patent, without hemodynamically significant stenosis or occlusion. There is no dissection or aneurysm. Vertebral arteries: The vertebral arteries are patent, without hemodynamically significant stenosis or occlusion. There is no dissection or aneurysm. Skeleton: There is no  acute osseous abnormality or aggressive osseous lesion. There is no visible canal hematoma. Other neck: The soft tissues are unremarkable. Upper chest: The imaged lung apices are clear. Review of the MIP images confirms the above findings CTA HEAD FINDINGS Anterior circulation: The intracranial ICAs are patent. The bilateral MCAs are patent. The bilateral ACAs are patent. There is no aneurysm or AVM. Posterior circulation: The bilateral V4 segments are patent. PICA is patent bilaterally. The basilar artery is patent. The bilateral PCAs are patent. The right posterior communicating artery is identified. There is no aneurysm or AVM. Venous sinuses: Patent. Anatomic variants: None. Review of the MIP images confirms the above findings CT Brain Perfusion Findings: CBF (<30%)  Volume: 0mL Perfusion (Tmax>6.0s) volume: 0mL Mismatch Volume: 0mL Infarction Location:n/a IMPRESSION: 1. Normal CTA of the head and neck. 2. No infarct core or penumbra identified. Electronically Signed   By: Lesia HausenPeter  Noone M.D.   On: 02/04/2021 18:55     Assessment and Plan:   TIA vs migraine Right sided numbness/vision changes - presented with chest pain with associated left arm and leg numbness with vision change and headache - Heat CT unremarkable - Neurology saw and suspect more complicated migraine - patient denies further symptoms  - MRI brain unremarkable - Tele with no arrhythmia  Chest pain - pain sounds somewhat atypical in nature. Also has some tenderness to palpation in the epigastric area - HS trop negative x3. EKG with nonspecific changes - No active chest pain - RF unclude family history, smoking, HTN - LDL 62, TG 198, HDL 36 - A1C pending - started on Lipitor 10mg  and ASA 81mg  daily - Echo bubble study pending - She is NPO, I will order Myoview Lexiscan - may benefit from PPI  Tobacco use - cessation recommended  IBS - per IM  For questions or updates, please contact [facility] Please consult  [email or web address] for contact info under    Signed, Cadence David StallH Furth, PA-C  02/05/2021 7:09 AM    Chest pain with typical and atypical features  Right-sided numbness and vision changes question migraine (prior history)  Tobacco use  Bipolar disorder   Chest discomfort described as pressure with radiation into the arm, also associated with numbness and visual changes.  The latter makes it less likely heart.  Troponins are negative but the description of the symptoms, while in the chart as lasting hours, from my history waxes and wanes.  Higher risk given her smoking history and family history.  Awaiting Myoview.  If okay with discharge without cardiac follow-up; smoking cessation strongly encouraged.

## 2021-02-05 NOTE — Progress Notes (Signed)
Dr. Okey Dupre called me as he was reviewing the stress test.  No perfusion abnormalities, however, striking changes on her baseline electrocardiogram such that her QT interval was now about 600 ms with diffuse ST segment changes, notching in the T waves  Reviewing her admission ECG, the corrected QTc interval is somewhere between 460 and 480 ms depending on correction.  There is also a little bit of difficulty in discerning the end of the T wave. Reviewed the history, no history of syncope, no history of sudden death in her family.  She is no longer taking the psychotropic medications.  Potassium was 3.4 on admission magnesium was not measured.

## 2021-02-05 NOTE — Progress Notes (Signed)
PT Cancellation Note  Patient Details Name: Holly Nicholson MRN: [id number] DOB: 1985/07/03   Cancelled Treatment:    Reason Eval/Treat Not Completed: PT screened, no needs identified, will sign off. Chart reviewed. Patient visited. Per MD and patient, patient is at baseline. No PT needs identified at this time. Please re-consult if additional needs arise. Thank you!   Angelica Ran, PT  02/05/21. 1:05 PM

## 2021-02-05 NOTE — Progress Notes (Signed)
Chart reviewed, Pt visited. Speech clear and appropriate, no dysphagia per Pt. Expressed that she wants to go home. NO ST needs identified at this time. Please reconsult if we can further assist with this pleasant Pt.

## 2021-02-05 NOTE — TOC Progression Note (Addendum)
Transition of Care [facility]) - Progression Note    Patient Details  Name: Holly Nicholson MRN: [id number] Date of Birth: 12-Dec-1985  [facility] CM/SW Contact  Marlowe Sax, RN Phone Number: 02/05/2021, 9:15 AM  Clinical Narrative:    Patient from home with her husband and adult son  She has [employer]BS with her employer and has a PCP in place,  Patient has transportation with husband.  No TOC needs       Expected Discharge Plan and Services                                                 Social Determinants of Health (SDOH) Interventions    Readmission Risk Interventions No flowsheet data found.

## 2021-02-05 NOTE — Progress Notes (Signed)
OT Cancellation Note  Patient Details Name: Holly Nicholson MRN: [id number] DOB: 06-09-1985   Cancelled Treatment:     OT order received.  Pt sitting up in bed, dressed.  She denies any needs, she was independent with dressing, toileting, grooming and self feeding today.  Reports her symptoms of weakness from yesterday appeared to have resolved and she is ready to go home.  No current OT needs, will sign off.    [place] Freyre T [place]ia, OTR/L, CLT   [place] 02/05/2021, 2:23 PM

## 2021-02-05 NOTE — Progress Notes (Signed)
MRI brain: Normal.   TTE: Pending  A/R: 36 y.o. female with a history of migraines who presents to the ED from home with acute onset of left sided weakness and sensory numbness. The patient does endorse having been under a lot of stress recently. She has no stroke risk factors.  - MRI brain, CTA head and neck, CTP all normal.  - TTE pending - Low suspicion for TIA   Electronically signed: Dr. Caryl Pina

## 2021-02-06 ENCOUNTER — Observation Stay (HOSPITAL_BASED_OUTPATIENT_CLINIC_OR_DEPARTMENT_OTHER)
Admit: 2021-02-06 | Discharge: 2021-02-06 | Disposition: A | Discharge status: Home / Self Care | Payer: BC Managed Care – PPO | Attending: Family Medicine | Admitting: Family Medicine

## 2021-02-06 DIAGNOSIS — G43809 Other migraine, not intractable, without status migrainosus: Secondary | ICD-10-CM

## 2021-02-06 DIAGNOSIS — G459 Transient cerebral ischemic attack, unspecified: Secondary | ICD-10-CM

## 2021-02-06 DIAGNOSIS — R079 Chest pain, unspecified: Secondary | ICD-10-CM | POA: Diagnosis not present

## 2021-02-06 DIAGNOSIS — R531 Weakness: Secondary | ICD-10-CM

## 2021-02-06 DIAGNOSIS — R9431 Abnormal electrocardiogram [ECG] [EKG]: Secondary | ICD-10-CM

## 2021-02-06 DIAGNOSIS — G43909 Migraine, unspecified, not intractable, without status migrainosus: Secondary | ICD-10-CM

## 2021-02-06 DIAGNOSIS — R0789 Other chest pain: Secondary | ICD-10-CM | POA: Diagnosis not present

## 2021-02-06 LAB — ECHOCARDIOGRAM COMPLETE BUBBLE STUDY
AR max vel: 2.2 cm2
AV Peak grad: 5.1 mmHg
Ao pk vel: 1.13 m/s
Area-P 1/2: 3.53 cm2
Calc EF: 60.3 %
S' Lateral: 2.8 cm
Single Plane A2C EF: 54.2 %
Single Plane A4C EF: 65.5 %

## 2021-02-06 MED ORDER — NADOLOL 40 MG PO TABS
40.0000 mg | ORAL_TABLET | Freq: Every day | ORAL | Status: DC
  Administered 2021-02-06: 40 mg via ORAL
  Filled 2021-02-06: qty 1

## 2021-02-06 MED ORDER — PERFLUTREN LIPID MICROSPHERE
1.0000 mL | INTRAVENOUS | Status: AC | PRN
  Administered 2021-02-06: 2 mL via INTRAVENOUS
  Filled 2021-02-06: qty 10

## 2021-02-06 MED ORDER — NADOLOL 40 MG PO TABS: 40.0000 mg | ORAL_TABLET | Freq: Every day | ORAL | 2 refills | Status: DC

## 2021-02-06 NOTE — Progress Notes (Signed)
Patient being discharged home. IV removed. Went over discharge instructions with patient and medications. Patient stated that she understood and all questions were answered. Gave patient list of medications contraindicated for Prolonged QT Syndrome. Patient going home POV with husband.

## 2021-02-06 NOTE — Progress Notes (Signed)
TTE: 1. Left ventricular ejection fraction, by estimation, is 55 to 60%. The  left ventricle has normal function. The left ventricle has no regional  wall motion abnormalities. There is mild left ventricular hypertrophy.  Left ventricular diastolic parameters  are consistent with Grade II diastolic dysfunction (pseudonormalization).   2. Right ventricular systolic function is normal. The right ventricular  size is normal.   3. The mitral valve is normal in structure. Trivial mitral valve  regurgitation. No evidence of mitral stenosis.   4. The aortic valve is tricuspid. Aortic valve regurgitation is not  visualized. No aortic stenosis is present.   5. There is mild dilatation of the aortic arch, measuring 35 mm.   6. The inferior vena cava is normal in size with greater than 50%  respiratory variability, suggesting right atrial pressure of 3 mmHg.   7. Agitated saline contrast bubble study was negative, with no evidence  of any interatrial shunt.   - Stroke work up complete, which is essentially negative.  - Will need outpatient neurology follow up - No further neurological recommendations  Electronically signed: Dr. Caryl Pina

## 2021-02-06 NOTE — Discharge Summary (Signed)
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 No pertinent past medical history.  HOSPITAL COURSE:   Holly Nicholson is a 36 y.o. Caucasian female with medical history significant for IBS, ongoing tobacco abuse, gestational hypertension and bipolar disorder, presented to the emergency room with acute onset of midsternal chest pain as well as left upper and lower extremity numbness and left upper extremity weakness.   EKG as reviewed by me : EKG showed normal sinus rhythm with a rate of 98 with poor R wave progression.   Imaging: Noncontrasted head CT scan revealed no acute intracranial pathology. CTA of the neck with and without contrast revealed was normal.  MRI brain negative   Left-sided numbness with headache suspected due to migraine vs TIA (less likely) -- MRI negative for stroke -- neurology consult noted--"Most likely components of the DDx, based on overall clinical presentation and imaging, are complicated migraine and stress-related symptoms. Low probability that her symptoms are due to TIA or stroke given negative imaging while still symptomatic" -- continue aspirin -- continue statins -- patient does not have any neuro- deficits today   chest pain with negative troponin -- no cardiac history -- patient underwent stress test per cardiology recommendation. [facility] cardiology Dr. Graciela HusbandsKlein noted patient has prolonged QT on stress EKG -- repeat ECG shows NSR today  QT is stable -- Dr. Graciela HusbandsKlein recommends start patient on nadolol 40 mg daily. She will follow-up outpatient further workup.  Dr. Graciela HusbandsKlein has discussed this with patient and family at length.  --pt is givein name of website to review any new meds that can prolong QTC   tobacco abuse -- recommend smoking cessation   Family communication :husband at bedside Consults : CODE STATUS:  DVT Prophylaxis : Level of care: Med-Surg Status is: Observation   D/c home       CONSULTS OBTAINED:  Treatment Team:  Lanier PrudeLambert, Cameron T, MD  DRUG ALLERGIES:  No Known Allergies  DISCHARGE MEDICATIONS:   Allergies as of 02/06/2021   No Known Allergies      Medication List     TAKE these medications    aspirin 81 MG EC tablet Take 1 tablet (81 mg total) by mouth daily. Swallow whole.   atorvastatin 10 MG tablet Commonly known as: LIPITOR Take 1 tablet (10 mg total) by mouth daily.   nadolol 40 MG tablet Commonly known as: CORGARD Take 1 tablet (40 mg total) by mouth daily.   nitroGLYCERIN 0.4 MG SL tablet Commonly known as: NITROSTAT Place 1 tablet (0.4 mg total) under the tongue every 5 (five) minutes as needed for chest pain.        If you experience worsening of your admission symptoms, develop shortness of breath, life threatening emergency, suicidal or homicidal thoughts you must seek medical attention immediately by calling 911 or calling your MD immediately  if symptoms less severe.  You Must read complete instructions/literature along with all the possible adverse reactions/side effects for all the Medicines you take and that have been prescribed to you. Take any new Medicines after you have completely understood and accept  all the possible adverse reactions/side effects.   Please note  You were cared for by a hospitalist during your hospital stay. If you have any questions about your discharge medications or the care you received while you were in the hospital after you are discharged, you can call the unit and asked to speak with the hospitalist on call if the hospitalist that took care of you is  not available. Once you are discharged, your primary care physician will handle any further medical issues. Please note that NO REFILLS for any discharge medications will be authorized once you are discharged, as it is imperative that you return to your primary care physician (or establish a relationship with a primary care physician if you do not have one) for your aftercare needs so that they can reassess your need for medications and monitor your lab values. Today   SUBJECTIVE    Doing overall ok. Anxious to go home VITAL SIGNS:  Blood pressure 133/88, pulse 83, temperature 98.7 F (37.1 C), resp. rate 20, height 5\' 1"  (1.549 m), weight 108.9 kg, SpO2 100 %.  I/O:   Intake/Output Summary (Last 24 hours) at 02/06/2021 1222 Last data filed at 02/06/2021 0900 Gross per 24 hour  Intake 840 ml  Output --  Net 840 ml    PHYSICAL EXAMINATION:  GENERAL:  36 y.o.-year-old patient lying in the bed with no acute distress.  LUNGS: Normal breath sounds bilaterally, no wheezing, rales,rhonchi or crepitation.  CARDIOVASCULAR: S1, S2 normal. No murmurs, rubs, or gallops.  ABDOMEN: Soft, non-tender, non-distended. Bowel sounds present.  EXTREMITIES: No  clubbing.  NEUROLOGIC: non-focal PSYCHIATRIC:  patient is alert and awake  SKIN: No obvious rash, lesion, or ulcer.   DATA REVIEW:   CBC  Recent Labs  Lab 02/04/21 1742  WBC 6.2  HGB 12.6  HCT 36.5  PLT 164    Chemistries  Recent Labs  Lab 02/04/21 1742 02/05/21 1532  NA 139 136  K 3.4* 3.7  CL 102 103  CO2 27 26  GLUCOSE 115* 99  BUN 8 9  CREATININE 0.97 0.77  CALCIUM 9.0 9.4  MG  --  2.1  AST 24  --   ALT 31  --   ALKPHOS 67  --   BILITOT 1.0  --     Microbiology Results   Recent Results (from the past 240 hour(s))  Resp Panel by RT-PCR (Flu A&B, Covid) Nasopharyngeal Swab     Status: None   Collection Time: 02/04/21  6:10 PM   Specimen: Nasopharyngeal Swab; Nasopharyngeal(NP) swabs in vial transport medium   Result Value Ref Range Status   SARS Coronavirus 2 by RT PCR NEGATIVE NEGATIVE Final    Comment: (NOTE) SARS-CoV-2 target nucleic acids are NOT DETECTED.  The SARS-CoV-2 RNA is generally detectable in upper respiratory specimens during the acute phase of infection. The lowest concentration of SARS-CoV-2 viral copies this assay can detect is 138 copies/mL. A negative result does not preclude SARS-Cov-2 infection and should not be used as the sole basis for treatment or other patient management decisions. A negative result may occur with  improper specimen collection/handling, submission of specimen other than nasopharyngeal swab, presence of viral mutation(s) within the areas targeted by this assay, and inadequate number of viral copies(<138 copies/mL). A negative result must be combined with clinical observations, patient history, and epidemiological information. The expected result is Negative.  Fact Sheet for Patients:  BloggerCourse.com  Fact Sheet for Healthcare Providers:  SeriousBroker.it  This test is  no t yet approved or cleared by the [place] and  has been authorized for detection and/or diagnosis of SARS-CoV-2 by FDA under an Emergency Use Authorization (EUA). This EUA will remain  in effect (meaning this test can be used) for the duration of the COVID-19 declaration under Section 564(b)(1) of the Act, 21 U.S.C.section 360bbb-3(b)(1), unless the authorization is terminated  or revoked sooner.       Influenza A by PCR NEGATIVE NEGATIVE Final   Influenza B by PCR NEGATIVE NEGATIVE Final    Comment: (NOTE) The Xpert Xpress SARS-CoV-2/FLU/RSV plus assay is intended as an aid in the diagnosis of influenza from Nasopharyngeal swab specimens and should not be used as a sole basis for treatment. Nasal washings and aspirates are unacceptable for Xpert Xpress SARS-CoV-2/FLU/RSV testing.  Fact Sheet for  Patients: BloggerCourse.com  Fact Sheet for Healthcare Providers: SeriousBroker.it  This test is not yet approved or cleared by the [place] FDA and has been authorized for detection and/or diagnosis of SARS-CoV-2 by FDA under an Emergency Use Authorization (EUA). This EUA will remain in effect (meaning this test can be used) for the duration of the COVID-19 declaration under Section 564(b)(1) of the Act, 21 U.S.C. section 360bbb-3(b)(1), unless the authorization is terminated or revoked.  Performed at [facility], [address] [place], [place] [address]     RADIOLOGY:  MR BRAIN WO CONTRAST  Result Date: 02/05/2021 CLINICAL DATA:  Left arm and leg numbness EXAM: MRI HEAD WITHOUT CONTRAST TECHNIQUE: Multiplanar, multiecho pulse sequences of the brain and surrounding structures were obtained without intravenous contrast. COMPARISON:  None. FINDINGS: Brain: No acute infarct, mass effect or extra-axial collection. No acute or chronic hemorrhage. Normal white matter signal, parenchymal volume and CSF spaces. The midline structures are normal. Vascular: Major flow voids are preserved. Skull and upper cervical spine: Normal calvarium and skull base. Visualized upper cervical spine and soft tissues are normal. Sinuses/Orbits:No paranasal sinus fluid levels or advanced mucosal thickening. No mastoid or middle ear effusion. Normal orbits. IMPRESSION: Normal brain MRI. Electronically Signed   By: Deatra Robinson M.D.   On: 02/05/2021 00:25   NM Myocar Multi W/Spect W/Wall Motion / EF  Result Date: 02/05/2021   Equivocal pharmacologic myocardial perfusion stress test without evidence of significant ischemia or scar on perfusion imaging but marked ECG changes, as detailed below.   Left ventricular function is normal (LVEF 55%).   No significant coronary artery calcification is observed.   Baseline ECG shows extensive ST/T changes, though  some degree of artifact may be present.  Inferior ST elevation, anterolateral ST depression, and widespread nonspecific T wave changes are present.  QT interval also appears significantly prolonged.  The patient was asymptomatic at the time.  ECG changes are new compared to yesterday's tracings.  Recommend repeating 12-lead ECG.   This is a low risk study, the sensitivity and specificity are degraded by patient's morbid obesity.   Findings and recommendations were reviewed in person with Dr. Graciela Husbands.   CT HEAD CODE STROKE WO CONTRAST  Result Date: 02/04/2021 CLINICAL DATA:  Code stroke. Left-sided chest pain and left arm and leg numbness EXAM: CT HEAD WITHOUT CONTRAST TECHNIQUE: Contiguous axial images were obtained from the base of the skull through the vertex without intravenous contrast. RADIATION DOSE REDUCTION: This exam was performed according to the departmental dose-optimization program which includes automated exposure control, adjustment of the mA and/or kV according to patient size and/or use of iterative reconstruction technique. COMPARISON:  None. FINDINGS: Brain: There is no evidence of acute intracranial hemorrhage, extra-axial fluid collection, or acute infarct Parenchymal volume is normal. The ventricles are normal in size. Gray-white differentiation is preserved. There is no mass lesion. There is no midline shift. Vascular: No hyperdense vessel or unexpected calcification. Skull: Normal. Negative for fracture or focal lesion. Sinuses/Orbits: There is mild mucosal thickening in the left maxillary sinus, incompletely imaged. The imaged globes and orbits are unremarkable. Other: None. ASPECTS [facility]([place] Stroke Program Early CT Score) - Ganglionic level infarction (caudate, lentiform nuclei, internal capsule, insula, M1-M3 cortex): 7 - Supraganglionic infarction (M4-M6 cortex): 3 Total score (0-10 with 10 being normal): 10 IMPRESSION: 1. No acute intracranial pathology. 2. ASPECTS is 10 These results  were called by telephone at the time of interpretation on 02/04/2021 at 5:44 pm to provider [facility] , who verbally acknowledged these results. Electronically Signed   By: Lesia HausenPeter  Noone M.D.   On: 02/04/2021 17:47   CT ANGIO HEAD NECK W WO CM W PERF (CODE STROKE)  Result Date: 02/04/2021 CLINICAL DATA:  Left-sided weakness EXAM: CT ANGIOGRAPHY HEAD AND NECK CT PERFUSION BRAIN TECHNIQUE: Multidetector CT imaging of the head and neck was performed using the standard protocol during bolus administration of intravenous contrast. Multiplanar CT image reconstructions and MIPs were obtained to evaluate the vascular anatomy. Carotid stenosis measurements (when applicable) are obtained utilizing NASCET criteria, using the distal internal carotid diameter as the denominator. Multiphase CT imaging of the brain was performed following IV bolus contrast injection. Subsequent parametric perfusion maps were calculated using RAPID software. RADIATION DOSE REDUCTION: This exam was performed according to the departmental dose-optimization program which includes automated exposure control, adjustment of the mA and/or kV according to patient size and/or use of iterative reconstruction technique. CONTRAST:  75mL OMNIPAQUE IOHEXOL 350 MG/ML SOLN. An additional 40cc was injected for the CT perfusion exam. COMPARISON:  None. FINDINGS: CTA NECK FINDINGS Aortic arch: The aortic arch is normal. The origins of the major branch vessels are patent. The subclavian arteries are patent. Right carotid system: The right common, internal, and external carotid arteries are patent, without hemodynamically significant stenosis or occlusion. There is no dissection or aneurysm. Left carotid system: The left common, internal, and external carotid arteries are patent, without hemodynamically significant stenosis or occlusion. There is no dissection or aneurysm. Vertebral arteries: The vertebral arteries are patent, without hemodynamically significant  stenosis or occlusion. There is no dissection or aneurysm. Skeleton: There is no acute osseous abnormality or aggressive osseous lesion. There is no visible canal hematoma. Other neck: The soft tissues are unremarkable. Upper chest: The imaged lung apices are clear. Review of the MIP images confirms the above findings CTA HEAD FINDINGS Anterior circulation: The intracranial ICAs are patent. The bilateral MCAs are patent. The bilateral ACAs are patent. There is no aneurysm or AVM. Posterior circulation: The bilateral V4 segments are patent. PICA is patent bilaterally. The basilar artery is patent. The bilateral PCAs are patent. The right posterior communicating artery is identified. There is no aneurysm or AVM. Venous sinuses: Patent. Anatomic variants: None. Review of the MIP images confirms the above findings CT Brain Perfusion Findings: CBF (<30%) Volume: 0mL Perfusion (Tmax>6.0s) volume: 0mL Mismatch Volume: 0mL Infarction Location:n/a IMPRESSION: 1. Normal CTA of the head and neck. 2. No infarct core or penumbra identified. Electronically Signed   By: Lesia HausenPeter  Noone M.D.   On: 02/04/2021 18:55     CODE STATUS:     Code Status Orders  (From admission, onward)  Start     Ordered   02/04/21 2036  Full code  Continuous        02/04/21 2038           Code Status History     This patient has a current code status but no historical code status.        TOTAL TIME TAKING CARE OF THIS PATIENT: 35 minutes.    Enedina Finner M.D  [facility]    CC: Primary care physician; Pcp, No

## 2021-02-06 NOTE — Progress Notes (Addendum)
Progress Note  Patient Name: Holly Nicholson Date of Encounter: 02/06/2021  Primary Cardiologist: None    Patient Profile:    Holly Nicholson is a 36 y.o. female with a hx of tobacco use, IBS, gestational HTN, biporla disorder who is being seen 02/05/2021 for the evaluation of chest pain at the request of Dr. Posey Pronto. Myoview >> no perfusion defects But 12 lead ECG  QTc 600 msec which has subsequently improved ECG from 7/17 to 12/20 in the chart to be merged were reviewed and QTc <450 msec in all ECG from ER QTc about 460     Subjective  No chest pain or shortness of breath  Inpatient Medications    Scheduled Meds:   stroke: mapping our early stages of recovery book   Does not apply Once   aspirin EC  81 mg Oral Daily   atorvastatin  10 mg Oral Daily   enoxaparin (LOVENOX) injection  40 mg Subcutaneous QHS   Continuous Infusions:  PRN Meds: acetaminophen **OR** acetaminophen (TYLENOL) oral liquid 160 mg/5 mL **OR** acetaminophen, nitroGLYCERIN, senna-docusate   Vital Signs    Vitals:   02/05/21 1646 02/05/21 2049 02/06/21 0543 02/06/21 0940  BP: 128/75 116/66 127/64 118/78  Pulse: 68 81 63 68  Resp: 18 16 16 18   Temp: 98.4 F (36.9 C) 98.1 F (36.7 C) 97.9 F (36.6 C) 97.8 F (36.6 C)  TempSrc: Oral  Oral   SpO2: 100% 97% 99% 100%  Weight:      Height:        Intake/Output Summary (Last 24 hours) at 02/06/2021 1041 Last data filed at 02/05/2021 1900 Gross per 24 hour  Intake 840 ml  Output --  Net 840 ml   Filed Weights   02/04/21 2110  Weight: 108.9 kg    Telemetry    Normal Sinus - Personally Reviewed  ECG    ECG normal  - Personally Reviewed  Physical Exam   GEN: No acute distress.   Neck: No JVD Cardiac: RRR, no murmurs, rubs, or gallops.  Respiratory: Clear to auscultation bilaterally. GI: Soft, nontender, non-distended  MS: No edema; No deformity. Neuro:  Nonfocal  Psych: Normal affect   Labs    Chemistry Recent Labs  Lab  02/04/21 1742 02/05/21 1532  NA 139 136  K 3.4* 3.7  CL 102 103  CO2 27 26  GLUCOSE 115* 99  BUN 8 9  CREATININE 0.97 0.77  CALCIUM 9.0 9.4  PROT 6.8  --   ALBUMIN 4.0  --   AST 24  --   ALT 31  --   ALKPHOS 67  --   BILITOT 1.0  --   GFRNONAA >60 >60  ANIONGAP 10 7     Hematology Recent Labs  Lab 02/04/21 1742  WBC 6.2  RBC 4.10  HGB 12.6  HCT 36.5  MCV 89.0  MCH 30.7  MCHC 34.5  RDW 12.8  PLT 164    Cardiac EnzymesNo results for input(s): TROPONINI in the last 168 hours. No results for input(s): TROPIPOC in the last 168 hours.   BNPNo results for input(s): BNP, PROBNP in the last 168 hours.   DDimer No results for input(s): DDIMER in the last 168 hours.   Radiology    MR BRAIN WO CONTRAST  Result Date: 02/05/2021 CLINICAL DATA:  Left arm and leg numbness EXAM: MRI HEAD WITHOUT CONTRAST TECHNIQUE: Multiplanar, multiecho pulse sequences of the brain and surrounding structures were obtained without intravenous contrast. COMPARISON:  None. FINDINGS: Brain: No acute infarct, mass effect or extra-axial collection. No acute or chronic hemorrhage. Normal white matter signal, parenchymal volume and CSF spaces. The midline structures are normal. Vascular: Major flow voids are preserved. Skull and upper cervical spine: Normal calvarium and skull base. Visualized upper cervical spine and soft tissues are normal. Sinuses/Orbits:No paranasal sinus fluid levels or advanced mucosal thickening. No mastoid or middle ear effusion. Normal orbits. IMPRESSION: Normal brain MRI. Electronically Signed   By: Ulyses Jarred M.D.   On: 02/05/2021 00:25   NM Myocar Multi W/Spect W/Wall Motion / EF  Result Date: 02/05/2021   Equivocal pharmacologic myocardial perfusion stress test without evidence of significant ischemia or scar on perfusion imaging but marked ECG changes, as detailed below.   Left ventricular function is normal (LVEF 55%).   No significant coronary artery calcification is  observed.   Baseline ECG shows extensive ST/T changes, though some degree of artifact may be present.  Inferior ST elevation, anterolateral ST depression, and widespread nonspecific T wave changes are present.  QT interval also appears significantly prolonged.  The patient was asymptomatic at the time.  ECG changes are new compared to yesterday's tracings.  Recommend repeating 12-lead ECG.   This is a low risk study, the sensitivity and specificity are degraded by patient's morbid obesity.   Findings and recommendations were reviewed in person with Dr. Caryl Comes.   CT HEAD CODE STROKE WO CONTRAST  Result Date: 02/04/2021 CLINICAL DATA:  Code stroke. Left-sided chest pain and left arm and leg numbness EXAM: CT HEAD WITHOUT CONTRAST TECHNIQUE: Contiguous axial images were obtained from the base of the skull through the vertex without intravenous contrast. RADIATION DOSE REDUCTION: This exam was performed according to the departmental dose-optimization program which includes automated exposure control, adjustment of the mA and/or kV according to patient size and/or use of iterative reconstruction technique. COMPARISON:  None. FINDINGS: Brain: There is no evidence of acute intracranial hemorrhage, extra-axial fluid collection, or acute infarct Parenchymal volume is normal. The ventricles are normal in size. Gray-white differentiation is preserved. There is no mass lesion. There is no midline shift. Vascular: No hyperdense vessel or unexpected calcification. Skull: Normal. Negative for fracture or focal lesion. Sinuses/Orbits: There is mild mucosal thickening in the left maxillary sinus, incompletely imaged. The imaged globes and orbits are unremarkable. Other: None. ASPECTS [facility] Stroke Program Early CT Score) - Ganglionic level infarction (caudate, lentiform nuclei, internal capsule, insula, M1-M3 cortex): 7 - Supraganglionic infarction (M4-M6 cortex): 3 Total score (0-10 with 10 being normal): 10 IMPRESSION: 1. No  acute intracranial pathology. 2. ASPECTS is 10 These results were called by telephone at the time of interpretation on 02/04/2021 at 5:44 pm to provider [facility] , who verbally acknowledged these results. Electronically Signed   By: Valetta Mole M.D.   On: 02/04/2021 17:47   CT ANGIO HEAD NECK W WO CM W PERF (CODE STROKE)  Result Date: 02/04/2021 CLINICAL DATA:  Left-sided weakness EXAM: CT ANGIOGRAPHY HEAD AND NECK CT PERFUSION BRAIN TECHNIQUE: Multidetector CT imaging of the head and neck was performed using the standard protocol during bolus administration of intravenous contrast. Multiplanar CT image reconstructions and MIPs were obtained to evaluate the vascular anatomy. Carotid stenosis measurements (when applicable) are obtained utilizing NASCET criteria, using the distal internal carotid diameter as the denominator. Multiphase CT imaging of the brain was performed following IV bolus contrast injection. Subsequent parametric perfusion maps were calculated using RAPID software. RADIATION DOSE REDUCTION: This exam was performed according  to the departmental dose-optimization program which includes automated exposure control, adjustment of the mA and/or kV according to patient size and/or use of iterative reconstruction technique. CONTRAST:  61mL OMNIPAQUE IOHEXOL 350 MG/ML SOLN. An additional 40cc was injected for the CT perfusion exam. COMPARISON:  None. FINDINGS: CTA NECK FINDINGS Aortic arch: The aortic arch is normal. The origins of the major branch vessels are patent. The subclavian arteries are patent. Right carotid system: The right common, internal, and external carotid arteries are patent, without hemodynamically significant stenosis or occlusion. There is no dissection or aneurysm. Left carotid system: The left common, internal, and external carotid arteries are patent, without hemodynamically significant stenosis or occlusion. There is no dissection or aneurysm. Vertebral arteries: The  vertebral arteries are patent, without hemodynamically significant stenosis or occlusion. There is no dissection or aneurysm. Skeleton: There is no acute osseous abnormality or aggressive osseous lesion. There is no visible canal hematoma. Other neck: The soft tissues are unremarkable. Upper chest: The imaged lung apices are clear. Review of the MIP images confirms the above findings CTA HEAD FINDINGS Anterior circulation: The intracranial ICAs are patent. The bilateral MCAs are patent. The bilateral ACAs are patent. There is no aneurysm or AVM. Posterior circulation: The bilateral V4 segments are patent. PICA is patent bilaterally. The basilar artery is patent. The bilateral PCAs are patent. The right posterior communicating artery is identified. There is no aneurysm or AVM. Venous sinuses: Patent. Anatomic variants: None. Review of the MIP images confirms the above findings CT Brain Perfusion Findings: CBF (<30%) Volume: 43mL Perfusion (Tmax>6.0s) volume: 43mL Mismatch Volume: 87mL Infarction Location:n/a IMPRESSION: 1. Normal CTA of the head and neck. 2. No infarct core or penumbra identified. Electronically Signed   By: Valetta Mole M.D.   On: 02/04/2021 18:55    Cardiac Studies   Myoview -- normal perfusion Very ABNORMAL ECG  Assessment & Plan    Migraine v TIA Chest pain with typical and atypical features QT prolongation transient   The patient has a presumed neurological event with tingling and weakness thought to be migraine more likely than TIA Literature review this am assoc QT prolongation with both TheQT prolongation was profound and assoc with notching of the T wave -- a pattern very concerning for the possible triggering of life-threatening abnormalities  She needs further evaluation fro the possibility of form fruste long QT which would put her at risk potentially from QT prolonging medications Testing recommendations will include, and can be done as outpt, review of the recovery strips  of ydays stress test, repeat stress testing looking for inappropriate lengthening of QT with recovery as well as possible epi infusion in the EP lab  Until then I will Rx a betablocker, and nadolol is preferred so will start at 40 mg daily  Also will give her crediblemeds.org website to guide Meds that she should avoid.  Have reviewed the aforementioned issues with the patient, QT prolongation in the setting of the event with mostly normal ECGs before and a normal ECG today, does this represent forme fruste or simply an isolated neurological event with this QT consequences and how does this inform prognosis.  Moreover, outlined the above-mentioned testing.   [id number] seeing the patient reviewing the literature talking with Dr. Posey Pronto  For questions or updates, please contact [facility] Please consult [email or web address] for contact info under Cardiology/STEMI.      Signed, Virl Axe, MD  02/06/2021, 10:41 AM

## 2021-02-07 ENCOUNTER — Encounter: Payer: Self-pay | Admitting: Family Medicine

## 2021-02-07 LAB — HEMOGLOBIN A1C
Hgb A1c MFr Bld: 5.3 % (ref 4.8–5.6)
Mean Plasma Glucose: 105 mg/dL

## 2021-02-08 ENCOUNTER — Telehealth: Payer: Self-pay | Admitting: Internal Medicine

## 2021-02-08 NOTE — Telephone Encounter (Signed)
Patient aware.

## 2021-02-08 NOTE — Telephone Encounter (Signed)
Patient called to schedule hospital fu with klein.   Dc 02/06/21   Scheduled 2/1 app and 2/23 klein   Please advise if 2/1 needed or too soon and if she should just keep 2/23 visit with [facility]

## 2021-02-08 NOTE — Progress Notes (Signed)
Cardiology Office Note:    Date:  02/09/2021   ID:  Holly Nicholson, DOB 01/15/1985, MRN [id number]  PCP:  Pcp, No  [facility] Cardiologist:  None  [facility] Electrophysiologist:  None   Referring MD: No ref. provider found   Chief Complaint:hospital follow-up  History of Present Illness:    Holly Nicholson is a 36 y.o. female with a hx of IBS, tobacco use, gestational HTN, bipolar disorder who presents for hospital follow-up.   Admitted recently for chest pain and left sided weakness. Echo bubble study showed LVEF 55-60%, no WMA, mild LVH, G2DD, trivial MR. Myoview showed no significant ischemia or scar, no significant coronary artery calcification, overall low risk, however it did have some marked EKG changes, including inferior ST elevation, anterolateral ST depression, and widespread nonspecific T wave changes, QT prolongation.Follow-up EKG showed normal Qtc. Seen by EP with concern for possible dangerous arrhythmia with suggestion for further testing. Started on Nadolol  Today, the  patient reports some SOB since being home from the hospital, feels this is from the recent hospitalization.  She also reports dull chest aching when she walks, different than what occurred in the hospital. No LLE, orthopnea, pnd. She reports compliance with medications. She is not having any more neurological symptoms. EKG today shows SB, 59bpm, TWI III.  Past Medical History:  Diagnosis Date   Anxiety    BMI 45.0-49.9, adult (HCC)    Depression    Elevated blood pressure    GERD (gastroesophageal reflux disease)    History of bilateral tubal ligation 2007   History of bilateral tubal ligation 2007   Hypertension    Low-lying placenta    Lower extremity edema    Tubal pregnancy Aug 2016    Past Surgical History:  Procedure Laterality Date   ABDOMINAL HYSTERECTOMY     CHOLECYSTECTOMY N/A 09/02/2015   Procedure: LAPAROSCOPIC CHOLECYSTECTOMY;  Surgeon: Leafy Ro, MD;  Location: [facility]  ORS;  Service: General;  Laterality: N/A;   CHOLECYSTECTOMY     KNEE SURGERY     LAPAROSCOPIC HYSTERECTOMY Bilateral 10/30/2017   Procedure: HYSTERECTOMY TOTAL LAPAROSCOPIC-BILATERAL SALPINGECTOMY;  Surgeon: Vena Austria, MD;  Location: [facility] ORS;  Service: Gynecology;  Laterality: Bilateral;   LAPAROSCOPIC SALPINGO OOPHERECTOMY N/A 10/19/2016   Procedure: LAPAROSCOPIC SALPINGECTOMY bilateral;  Surgeon: Nadara Mustard, MD;  Location: [facility] ORS;  Service: Gynecology;  Laterality: N/A;   TUBAL LIGATION     TUBAL LIGATION Bilateral    Tubal reanastamosis      Current Medications: Current Meds  Medication Sig   aspirin EC 81 MG EC tablet Take 1 tablet (81 mg total) by mouth daily. Swallow whole.   atorvastatin (LIPITOR) 10 MG tablet Take 1 tablet (10 mg total) by mouth daily.   nadolol (CORGARD) 40 MG tablet Take 1 tablet (40 mg total) by mouth daily.   nitroGLYCERIN (NITROSTAT) 0.4 MG SL tablet Place 1 tablet (0.4 mg total) under the tongue every 5 (five) minutes as needed for chest pain.     Allergies:   Onion and Latex   Social History   Socioeconomic History   Marital status: Married    Spouse name: Not on file   Number of children: 4   Years of education: Not on file   Highest education level: Some college, no degree  Occupational History   Not on file  Tobacco Use   Smoking status: Every Day    Packs/day: 0.50    Types: Cigarettes   Smokeless tobacco:  Never  Vaping Use   Vaping Use: Never used  Substance and Sexual Activity   Alcohol use: Not Currently   Drug use: Not Currently    Types: Marijuana   Sexual activity: Yes    Birth control/protection: Surgical  Other Topics Concern   Not on file  Social History Narrative   ** Merged History Encounter **       Social Determinants of Health   Financial Resource Strain: Not on file  Food Insecurity: Not on file  Transportation Needs: Not on file  Physical Activity: Not on file  Stress: Not on file  Social  Connections: Not on file     Family History: The patient's family history includes Anxiety disorder in her maternal aunt and mother; Asthma in her son; Cancer in her maternal grandfather and paternal grandmother; Depression in her maternal aunt and mother; Diabetes in her father; Epilepsy in her daughter; Heart disease in her father and mother; Hyperlipidemia in her father and mother; Hypertension in her father and mother; Stroke in her father and paternal grandfather.  ROS:   Please see the history of present illness.     All other systems reviewed and are negative.  EKGs/Labs/Other Studies Reviewed:    The following studies were reviewed today:  Myoview   Equivocal pharmacologic myocardial perfusion stress test without evidence of significant ischemia or scar on perfusion imaging but marked ECG changes, as detailed below.   Left ventricular function is normal (LVEF 55%).   No significant coronary artery calcification is observed.   Baseline ECG shows extensive ST/T changes, though some degree of artifact may be present.  Inferior ST elevation, anterolateral ST depression, and widespread nonspecific T wave changes are present.  QT interval also appears significantly prolonged.  The patient was asymptomatic at the time.  ECG changes are new compared to yesterday's tracings.  Recommend repeating 12-lead ECG.   This is a low risk study, the sensitivity and specificity are degraded by patient's morbid obesity.   Findings and recommendations were reviewed in person with Dr. Graciela HusbandsKlein.   Echo bubble study 01/27/2021  1. Left ventricular ejection fraction, by estimation, is 55 to 60%. The  left ventricle has normal function. The left ventricle has no regional  wall motion abnormalities. There is mild left ventricular hypertrophy.  Left ventricular diastolic parameters  are consistent with Grade II diastolic dysfunction (pseudonormalization).   2. Right ventricular systolic function is normal. The  right ventricular  size is normal.   3. The mitral valve is normal in structure. Trivial mitral valve  regurgitation. No evidence of mitral stenosis.   4. The aortic valve is tricuspid. Aortic valve regurgitation is not  visualized. No aortic stenosis is present.   5. There is mild dilatation of the aortic arch, measuring 35 mm.   6. The inferior vena cava is normal in size with greater than 50%  respiratory variability, suggesting right atrial pressure of 3 mmHg.   7. Agitated saline contrast bubble study was negative, with no evidence  of any interatrial shunt.   EKG:  EKG is ordered today.  The ekg ordered today demonstrates SB 59bpm, Qtc 396ms, TWI III  Recent Labs: 02/04/2021: ALT 31; Hemoglobin 12.6; Platelets 164 02/05/2021: BUN 9; Creatinine, Ser 0.77; Magnesium 2.1; Potassium 3.7; Sodium 136  Recent Lipid Panel    Component Value Date/Time   CHOL 138 02/05/2021 0223   CHOL 154 08/20/2014 1041   TRIG 198 (H) 02/05/2021 0223   HDL 36 (L) 02/05/2021 [id number]  HDL 41 08/20/2014 1041   CHOLHDL 3.8 02/05/2021 0223   VLDL 40 02/05/2021 0223   LDLCALC 62 02/05/2021 0223   LDLCALC 81 08/20/2014 1041     Physical Exam:    VS:  BP 118/90 (BP Location: Left Arm, Patient Position: Sitting, Cuff Size: Large)    Pulse (!) 59    Ht 5\' 1"  (1.549 m)    Wt 255 lb 6 oz (115.8 kg)    LMP 08/13/2017 (Exact Date)    SpO2 98%    BMI 48.25 kg/m     Wt Readings from Last 3 Encounters:  02/09/21 255 lb 6 oz (115.8 kg)  02/04/21 240 lb (108.9 kg)  04/09/19 234 lb (106.1 kg)     GEN:  Well nourished, well developed in no acute distress HEENT: Normal NECK: No JVD; No carotid bruits LYMPHATICS: No lymphadenopathy CARDIAC: RRR, no murmurs, rubs, gallops RESPIRATORY:  Clear to auscultation without rales, wheezing or rhonchi  ABDOMEN: Soft, non-tender, non-distended MUSCULOSKELETAL:  No edema; No deformity  SKIN: Warm and dry NEUROLOGIC:  Alert and oriented x 3 PSYCHIATRIC:  Normal affect    ASSESSMENT:    1. Chest pain of uncertain etiology   2. Essential hypertension   3. Hyperlipidemia, mixed   4. Prolonged QT interval    PLAN:    In order of problems listed above:  Transient prolonged Qtc (during lexiscan myoview) Stress test with marked EKG changes (as above), unsure if these were due to neurologic issue or larger electrical cardiac issues. She is no longer having neurologic symptoms. EKG today showed SB, Qtc 04/11/19. Discussed with DOD, Dr, End and he did not recommend repeat stress test.   Chest heaviness/SOB She reports exertional SOB and chest heaviness, however suspect symptoms are moreso from recent hospitalization. Echo during admission showed LVEF 55-60%, mild LVH, G2DD, normal RV function, trivial MR, mild aortic arch dilation measuring 41mm. Myoview showed no significant ischemia or scar, no significant coronary artery calcification observed, overall was low risk. We will see her back in 2-3 weeks to reevaluate symptoms then. If they are persistent can consider Cardiac CTA. Continue Aspirin, Lipitor, and nadolol. I will start her on Protonix 40mg  daily and recommended Tylenol for any MSK pain.   HTN BP good today, continue current medications  HLD Lipid profile showed TG 198, HDL 36, LDL 62, Chol 138. She was started on Lipitor 10mg  daily. Can re-check labs at follow-up.   Disposition: Follow up in 2-3 week(s) with MD/APP    Signed, Nira Visscher 21m, PA-C  02/09/2021 11:57 AM    [facility]

## 2021-02-09 ENCOUNTER — Ambulatory Visit: Payer: BC Managed Care – PPO | Admitting: Medical

## 2021-02-09 ENCOUNTER — Encounter: Payer: Self-pay | Admitting: Medical

## 2021-02-09 ENCOUNTER — Other Ambulatory Visit: Payer: Self-pay

## 2021-02-09 VITALS — BP 118/90 | HR 59 | Ht 61.0 in | Wt 255.4 lb

## 2021-02-09 DIAGNOSIS — R9431 Abnormal electrocardiogram [ECG] [EKG]: Secondary | ICD-10-CM

## 2021-02-09 DIAGNOSIS — E782 Mixed hyperlipidemia: Secondary | ICD-10-CM | POA: Diagnosis not present

## 2021-02-09 DIAGNOSIS — R079 Chest pain, unspecified: Secondary | ICD-10-CM

## 2021-02-09 DIAGNOSIS — I10 Essential (primary) hypertension: Secondary | ICD-10-CM

## 2021-02-09 MED ORDER — PANTOPRAZOLE SODIUM 40 MG PO TBEC: 40.0000 mg | DELAYED_RELEASE_TABLET | Freq: Every day | ORAL | 11 refills | Status: DC

## 2021-02-09 NOTE — Patient Instructions (Signed)
Medication Instructions:   Your physician recommends that you continue on your current medications as directed. Please refer to the Current Medication list given to you today.   *If you need a refill on your cardiac medications before your next appointment, please call your pharmacy*   Lab Work: None ordered  If you have labs (blood work) drawn today and your tests are completely normal, you will receive your results only by: [place] (if you have MyChart) OR A paper copy in the mail If you have any lab test that is abnormal or we need to change your treatment, we will call you to review the results.   Testing/Procedures: None ordered   Follow-Up: At [facility], you and your health needs are our priority.  As part of our continuing mission to provide you with exceptional heart care, we have created designated Provider Care Teams.  These Care Teams include your primary Cardiologist (physician) and Advanced Practice Providers (APPs -  Physician Assistants and Nurse Practitioners) who all work together to provide you with the care you need, when you need it.  We recommend signing up for the patient portal called "MyChart".  Sign up information is provided on this After Visit Summary.  MyChart is used to connect with patients for Virtual Visits (Telemedicine).  Patients are able to view lab/test results, encounter notes, upcoming appointments, etc.  Non-urgent messages can be sent to your provider as well.   To learn more about what you can do with MyChart, go to [email or web address].    Your next appointment:   3 week(s)  The format for your next appointment:   In Person  Provider:   You may see Nelva Bush, MD or one of the following Advanced Practice Providers on your designated Care Team:   Murray Hodgkins, NP Christell Faith, PA-C Cadence Kathlen Mody, PA-C:1}    Other Instructions N/A

## 2021-03-03 ENCOUNTER — Other Ambulatory Visit: Payer: Self-pay

## 2021-03-03 ENCOUNTER — Ambulatory Visit (INDEPENDENT_AMBULATORY_CARE_PROVIDER_SITE_OTHER): Payer: BC Managed Care – PPO | Admitting: Internal Medicine

## 2021-03-03 ENCOUNTER — Encounter: Payer: Self-pay | Admitting: Internal Medicine

## 2021-03-03 VITALS — BP 130/82 | HR 58 | Ht 61.0 in | Wt 258.0 lb

## 2021-03-03 DIAGNOSIS — R9431 Abnormal electrocardiogram [ECG] [EKG]: Secondary | ICD-10-CM

## 2021-03-03 DIAGNOSIS — I10 Essential (primary) hypertension: Secondary | ICD-10-CM

## 2021-03-03 DIAGNOSIS — R079 Chest pain, unspecified: Secondary | ICD-10-CM | POA: Diagnosis not present

## 2021-03-03 MED ORDER — ATORVASTATIN CALCIUM 10 MG PO TABS: 10.0000 mg | ORAL_TABLET | Freq: Every day | ORAL | 3 refills | Status: DC

## 2021-03-03 NOTE — Patient Instructions (Addendum)
Medication Instructions:  - Your physician recommends that you continue on your current medications as directed. Please refer to the Current Medication list given to you today.   - Please DO NOT take Zofran   *If you need a refill on your cardiac medications before your next appointment, please call your pharmacy*   Lab Work: - none ordered  If you have labs (blood work) drawn today and your tests are completely normal, you will receive your results only by: MyChart Message (if you have MyChart) OR A paper copy in the mail If you have any lab test that is abnormal or we need to change your treatment, we will call you to review the results.   Testing/Procedures:  1) Treadmill Stress Test:  (on a day Dr. Graciela Husbands is in the office- Herbert Seta can do in an 8:00 am/ 8:20 am time slot)   -  you may eat a light breakfast/ lunch prior to your procedure - no caffeine for 24 hours prior to your test (coffee, tea, soft drinks, or chocolate)  - no smoking/ vaping for 4 hours prior to your test - you may take your regular medications the day of your test - bring any inhalers with you to your test - wear comfortable clothing & tennis/ non-skid shoes to walk on the treadmill    Follow-Up: At [facility], you and your health needs are our priority.  As part of our continuing mission to provide you with exceptional heart care, we have created designated Provider Care Teams.  These Care Teams include your primary Cardiologist (physician) and Advanced Practice Providers (APPs -  Physician Assistants and Nurse Practitioners) who all work together to provide you with the care you need, when you need it.  We recommend signing up for the patient portal called "MyChart".  Sign up information is provided on this After Visit Summary.  MyChart is used to connect with patients for Virtual Visits (Telemedicine).  Patients are able to view lab/test results, encounter notes, upcoming appointments, etc.  Non-urgent  messages can be sent to your provider as well.   To learn more about what you can do with MyChart, go to [email or web address].    Your next appointment:   Pending the results of your treadmill test   The format for your next appointment:   In Person  Provider:   Sherryl Manges, MD    Other Instructions  Exercise Stress Test An exercise stress test is a test to check how your heart works during exercise. You will need to walk on a treadmill or ride an exercise bike for this test. An electrocardiogram (ECG) will record your heartbeat when you are at rest and when you are exercising. You may have an ultrasound or nuclear test after the exercise test. The test is done to check for coronary artery disease (CAD). It is also done to: See how well you can exercise. Watch for high blood pressure during exercise. Test how well you can exercise after treatment. Check the blood flow to your arms and legs. If your test result is not normal, more testing may be needed. What happens before the procedure? Follow instructions from your doctor about what you cannot eat or drink. Do not have any drinks or foods that have caffeine in them for 24 hours before the test, or as told by your doctor. This includes coffee, tea (even decaf tea), sodas, chocolate, and cocoa. Ask your doctor about changing or stopping your normal medicines. This is important if  you: Take diabetes medicines. Take beta-blocker medicines. Wear a nitroglycerin patch. If you use an inhaler, bring it with you to the test. Do not put lotions, powders, creams, or oils on your chest before the test. Wear comfortable shoes and clothing. Do not use any products that have nicotine or tobacco in them, such as cigarettes and e-cigarettes. Stop using them at least 4 hours before the test. If you need help quitting, ask your doctor. What happens during the procedure?  Patches (electrodes) will be put on your chest. Wires will be connected to  the patches. The wires will send signals to a machine to record your heartbeat. Your heart rate will be watched while you are resting and while you are exercising. Your blood pressure will also be watched during the test. You will walk on a treadmill or use an exercise bike. If you cannot use these, you may be asked to turn a crank with your hands. The activity will get harder and will raise your heart rate. You may be asked to breathe into a tube a few times during the test. This measures the gases that you breathe out. You will be asked how you are feeling throughout the test. You will exercise until your heart reaches a target heart rate. You will stop early if: You feel dizzy. You have chest pain. You are out of breath. Your blood pressure is too high or too low. You have an irregular heartbeat. You have pain or aching in your arms or legs. The procedure may vary among doctors and hospitals. What happens after the procedure? Your blood pressure, heart rate, breathing rate, and blood oxygen level will be watched after the test. You may return to your normal diet and activities as told by your doctor. It is up to you to get the results of your test. Ask your doctor, or the department that is doing the test, when your results will be ready. Summary An exercise stress test is a test to check how your heart works during exercise. This test is done to check for coronary artery disease. Your heart rate will be watched while you are resting and while you are exercising. Follow instructions from your doctor about what you cannot eat or drink before the test. This information is not intended to replace advice given to you by your health care provider. Make sure you discuss any questions you have with your health care provider. Document Revised: 08/26/2019 Document Reviewed: 08/29/2019 Elsevier Patient Education  2022 [employer].

## 2021-03-03 NOTE — Progress Notes (Unsigned)
Cardiology Office Note:    Date:  03/03/2021   ID:  Holly Nicholson, DOB 11/13/1985, MRN [id number]  PCP:  Pcp, No  [facility] Cardiologist:  None  [facility] Electrophysiologist:  None   Referring MD: No ref. provider found   Chief Complaint: 3 week follow-up  History of Present Illness:    Holly Nicholson is a 36 y.o. female with a hx of with a hx of IBS, tobacco use, gestational HTN, bipolar disorder who presents for hospital follow-up.    Admitted 01/2021 for chest pain and left sided weakness. Echo bubble study showed LVEF 55-60%, no WMA, mild LVH, G2DD, trivial MR. Myoview showed no significant ischemia or scar, no significant coronary artery calcification, overall low risk, however it did have some marked EKG changes, including inferior ST elevation, anterolateral ST depression, and widespread nonspecific T wave changes, QT prolongation.Follow-up EKG showed normal Qtc. Seen by EP with concern for possible dangerous arrhythmia with suggestion for further testing. Started on Nadolol.   Seen on 2/1 and had some SOB post-hospitalization. EKG showed Qtc , MD consulted who felt further testing was not needed. She was started on Protonix and Tylenol for MSK pain.  Today,   Past Medical History:  Diagnosis Date   Anxiety    BMI 45.0-49.9, adult (HCC)    Depression    Elevated blood pressure    GERD (gastroesophageal reflux disease)    History of bilateral tubal ligation 2007   History of bilateral tubal ligation 2007   Hypertension    Low-lying placenta    Lower extremity edema    Tubal pregnancy Aug 2016    Past Surgical History:  Procedure Laterality Date   ABDOMINAL HYSTERECTOMY     CHOLECYSTECTOMY N/A 09/02/2015   Procedure: LAPAROSCOPIC CHOLECYSTECTOMY;  Surgeon: Leafy Ro, MD;  Location: [facility];  Service: General;  Laterality: N/A;   CHOLECYSTECTOMY     KNEE SURGERY     LAPAROSCOPIC HYSTERECTOMY Bilateral 10/30/2017   Procedure: HYSTERECTOMY TOTAL  LAPAROSCOPIC-BILATERAL SALPINGECTOMY;  Surgeon: Vena Austria, MD;  Location: [facility];  Service: Gynecology;  Laterality: Bilateral;   LAPAROSCOPIC SALPINGO OOPHERECTOMY N/A 10/19/2016   Procedure: LAPAROSCOPIC SALPINGECTOMY bilateral;  Surgeon: Nadara Mustard, MD;  Location: [facility];  Service: Gynecology;  Laterality: N/A;   TUBAL LIGATION     TUBAL LIGATION Bilateral    Tubal reanastamosis      Current Medications: No outpatient medications have been marked as taking for the 03/04/21 encounter (Appointment) with Fransico Michael, Nishtha Raider H, PA-C.     Allergies:   Onion and Latex   Social History   Socioeconomic History   Marital status: Married    Spouse name: Not on file   Number of children: 4   Years of education: Not on file   Highest education level: Some college, no degree  Occupational History   Not on file  Tobacco Use   Smoking status: Every Day    Packs/day: 0.50    Types: Cigarettes   Smokeless tobacco: Never  Vaping Use   Vaping Use: Never used  Substance and Sexual Activity   Alcohol use: Not Currently   Drug use: Not Currently    Types: Marijuana   Sexual activity: Yes    Birth control/protection: Surgical  Other Topics Concern   Not on file  Social History Narrative   ** Merged History Encounter **       Social Determinants of Health   Financial Resource Strain: Not on file  Food  Insecurity: Not on file  Transportation Needs: Not on file  Physical Activity: Not on file  Stress: Not on file  Social Connections: Not on file     Family History: The patient's family history includes Anxiety disorder in her maternal aunt and mother; Asthma in her son; Cancer in her maternal grandfather and paternal grandmother; Depression in her maternal aunt and mother; Diabetes in her father; Epilepsy in her daughter; Heart disease in her father and mother; Hyperlipidemia in her father and mother; Hypertension in her father and mother; Stroke in her father and paternal  grandfather.  ROS:   Please see the history of present illness.     All other systems reviewed and are negative.  EKGs/Labs/Other Studies Reviewed:    The following studies were reviewed today:  Myoview   Equivocal pharmacologic myocardial perfusion stress test without evidence of significant ischemia or scar on perfusion imaging but marked ECG changes, as detailed below.   Left ventricular function is normal (LVEF 55%).   No significant coronary artery calcification is observed.   Baseline ECG shows extensive ST/T changes, though some degree of artifact may be present.  Inferior ST elevation, anterolateral ST depression, and widespread nonspecific T wave changes are present.  QT interval also appears significantly prolonged.  The patient was asymptomatic at the time.  ECG changes are new compared to yesterday's tracings.  Recommend repeating 12-lead ECG.   This is a low risk study, the sensitivity and specificity are degraded by patient's morbid obesity.   Findings and recommendations were reviewed in person with Dr. Graciela Husbands.     Echo bubble study 01/27/2021  1. Left ventricular ejection fraction, by estimation, is 55 to 60%. The  left ventricle has normal function. The left ventricle has no regional  wall motion abnormalities. There is mild left ventricular hypertrophy.  Left ventricular diastolic parameters  are consistent with Grade II diastolic dysfunction (pseudonormalization).   2. Right ventricular systolic function is normal. The right ventricular  size is normal.   3. The mitral valve is normal in structure. Trivial mitral valve  regurgitation. No evidence of mitral stenosis.   4. The aortic valve is tricuspid. Aortic valve regurgitation is not  visualized. No aortic stenosis is present.   5. There is mild dilatation of the aortic arch, measuring 35 mm.   6. The inferior vena cava is normal in size with greater than 50%  respiratory variability, suggesting right atrial  pressure of 3 mmHg.   7. Agitated saline contrast bubble study was negative, with no evidence  of any interatrial shunt.   EKG:  EKG is *** ordered today.  The ekg ordered today demonstrates ***  Recent Labs: 02/04/2021: ALT 31; Hemoglobin 12.6; Platelets 164 02/05/2021: BUN 9; Creatinine, Ser 0.77; Magnesium 2.1; Potassium 3.7; Sodium 136  Recent Lipid Panel    Component Value Date/Time   CHOL 138 02/05/2021 0223   CHOL 154 08/20/2014 1041   TRIG 198 (H) 02/05/2021 0223   HDL 36 (L) 02/05/2021 0223   HDL 41 08/20/2014 1041   CHOLHDL 3.8 02/05/2021 0223   VLDL 40 02/05/2021 0223   LDLCALC 62 02/05/2021 0223   LDLCALC 81 08/20/2014 1041     Risk Assessment/Calculations:   {Does this patient have ATRIAL FIBRILLATION?[phone]}   Physical Exam:    VS:  LMP 08/13/2017 (Exact Date)     Wt Readings from Last 3 Encounters:  02/09/21 255 lb 6 oz (115.8 kg)  02/04/21 240 lb (108.9 kg)  04/09/19 234 lb (  106.1 kg)     GEN: *** Well nourished, well developed in no acute distress HEENT: Normal NECK: No JVD; No carotid bruits LYMPHATICS: No lymphadenopathy CARDIAC: ***RRR, no murmurs, rubs, gallops RESPIRATORY:  Clear to auscultation without rales, wheezing or rhonchi  ABDOMEN: Soft, non-tender, non-distended MUSCULOSKELETAL:  No edema; No deformity  SKIN: Warm and dry NEUROLOGIC:  Alert and oriented x 3 PSYCHIATRIC:  Normal affect   ASSESSMENT:    No diagnosis found. PLAN:    In order of problems listed above:  Transient prolonged Qtc during lexiscan myoview  Chest heaviness/SOB Cardiac CTA?  HTN  HLD Re-check labs  Disposition: Follow up {follow up:15908} with ***   Shared Decision Making/Informed Consent   {Are you ordering a CV Procedure (e.g. stress test, cath, DCCV, TEE, etc)?   Press F2        :[id number]}    Signed, Elton Heid Ardelle Lesches  03/03/2021 10:22 AM    [facility]

## 2021-03-03 NOTE — Progress Notes (Signed)
Patient Care Team: Pcp, No as PCP - General Patient, No Pcp Per (Inactive) (General Practice)   HPI  Holly Nicholson is a 36 y.o. female Seen in follow-up following an admission for chest pain with typical and atypical features that was associated for a short period of time with profound QT prolongation up to about 600 ms which then corrected over a number of hours to less than 450.  Her QTc in the emergency room had been about 460. No family history of syncope or sudden death  She noted that her mother and father both had heart conditions, their charts were reviewed as she comes in today with some degree of asymmetric swelling which is actually longstanding.  Her mother was on Coumadin with a reported BMI of 70 and cor pulmonale.  Her father had left ventricular dysfunction.  Occasional palpitations without cerebral symptoms no chest pain or shortness of breath  Myoview scan at that time was unrevealing  Date Cr K Hgb  02/04/21 0.97 3.4 12.6  02/05/21 0.77 3.7       Records and Results Reviewed  yes  Past Medical History:  Diagnosis Date   Anxiety    BMI 45.0-49.9, adult (HCC)    Depression    Elevated blood pressure    GERD (gastroesophageal reflux disease)    History of bilateral tubal ligation 2007   History of bilateral tubal ligation 2007   Hypertension    Low-lying placenta    Lower extremity edema    Tubal pregnancy Aug 2016    Past Surgical History:  Procedure Laterality Date   ABDOMINAL HYSTERECTOMY     CHOLECYSTECTOMY N/A 09/02/2015   Procedure: LAPAROSCOPIC CHOLECYSTECTOMY;  Surgeon: Leafy Ro, MD;  Location: [facility];  Service: General;  Laterality: N/A;   CHOLECYSTECTOMY     KNEE SURGERY     LAPAROSCOPIC HYSTERECTOMY Bilateral 10/30/2017   Procedure: HYSTERECTOMY TOTAL LAPAROSCOPIC-BILATERAL SALPINGECTOMY;  Surgeon: Vena Austria, MD;  Location: [facility];  Service: Gynecology;  Laterality: Bilateral;   LAPAROSCOPIC SALPINGO OOPHERECTOMY  N/A 10/19/2016   Procedure: LAPAROSCOPIC SALPINGECTOMY bilateral;  Surgeon: Nadara Mustard, MD;  Location: [facility];  Service: Gynecology;  Laterality: N/A;   TUBAL LIGATION     TUBAL LIGATION Bilateral    Tubal reanastamosis      Current Meds  Medication Sig   aspirin EC 81 MG EC tablet Take 1 tablet (81 mg total) by mouth daily. Swallow whole.   atorvastatin (LIPITOR) 10 MG tablet Take 1 tablet (10 mg total) by mouth daily.   meclizine (ANTIVERT) 25 MG tablet Take 25 mg by mouth 3 (three) times daily.   nadolol (CORGARD) 40 MG tablet Take 1 tablet (40 mg total) by mouth daily.   nitroGLYCERIN (NITROSTAT) 0.4 MG SL tablet Place 1 tablet (0.4 mg total) under the tongue every 5 (five) minutes as needed for chest pain.   pantoprazole (PROTONIX) 40 MG tablet Take 1 tablet (40 mg total) by mouth daily.    Allergies  Allergen Reactions   Onion Anaphylaxis, Swelling and Hives    Raw onions causes throat to swell   Latex Dermatitis      Review of Systems negative except from HPI and PMH  Physical Exam BP 130/82 (BP Location: Left Arm, Patient Position: Sitting, Cuff Size: Large)    Pulse (!) 58    Ht 5\' 1"  (1.549 m)    Wt 258 lb (117 kg)    LMP 08/13/2017 (Exact Date)  SpO2 99%    BMI 48.75 kg/m  Well developed and well nourished in no acute distress HENT normal E scleral and icterus clear Neck Supple Clear to ausculation Regular rate and rhythm, no murmurs gallops or rub Soft   No clubbing cyanosis  Edema Alert and oriented, grossly normal motor and sensory function Skin Warm and Dry  ECG sinus at 58 0 15/09/43 with a QTc of 42 T wave inversions lead V2 and V3  CrCl cannot be calculated (Patient's most recent lab result is older than the maximum 21 days allowed.).   Assessment and  Plan QT prolongation-profound-transient  Chest pain with typical and atypical features   Right-sided numbness and vision changes question migraine (prior history)   Tobacco use    Bipolar disorder   The patient had transient very profound QT prolongation in the temporal context of a neurological event which was thought to either be a TIA or migraine.  It then resolved within a couple of hours.  She is now on nadolol at which we will continue at 40 mg a day.  Evaluation will include treadmill testing looking for abnormal recovery and then possibly an epinephrine infusion.   Current medicines are reviewed at length with the patient today .  The patient does not have concerns regarding medicines.

## 2021-03-04 ENCOUNTER — Ambulatory Visit: Payer: BC Managed Care – PPO | Admitting: Medical

## 2021-03-08 ENCOUNTER — Ambulatory Visit (INDEPENDENT_AMBULATORY_CARE_PROVIDER_SITE_OTHER): Payer: BC Managed Care – PPO | Admitting: Internal Medicine

## 2021-03-08 ENCOUNTER — Other Ambulatory Visit: Payer: Self-pay

## 2021-03-08 DIAGNOSIS — R9431 Abnormal electrocardiogram [ECG] [EKG]: Secondary | ICD-10-CM | POA: Diagnosis not present

## 2021-03-18 NOTE — Progress Notes (Unsigned)
Submitted for treadmill to look for abnormal QT behaviour in recovery-- was normal

## 2021-04-04 LAB — FETAL NONSTRESS TEST

## 2021-05-08 ENCOUNTER — Telehealth: Payer: Self-pay | Admitting: Medical

## 2021-05-08 MED ORDER — NADOLOL 40 MG PO TABS: 40.0000 mg | ORAL_TABLET | Freq: Every day | ORAL | 2 refills | Status: DC

## 2021-05-08 NOTE — Telephone Encounter (Signed)
? ? ?  Patient called the after-hours line requesting a refill of her nadolol.  Medication was sent to her preferred pharmacy.  Patient was appreciative of the call. ? ?Beatriz Stallion, PA-C ?05/08/21; 12:40 PM ? ?

## 2021-06-19 ENCOUNTER — Other Ambulatory Visit: Payer: Self-pay | Admitting: Internal Medicine

## 2021-08-01 ENCOUNTER — Other Ambulatory Visit: Payer: Self-pay

## 2021-08-01 ENCOUNTER — Emergency Department
Admission: EM | Admit: 2021-08-01 | Discharge: 2021-08-02 | Disposition: A | Discharge status: Other Acute Inpatient Hospital | Payer: BC Managed Care – PPO | Attending: Emergency Medicine | Admitting: Emergency Medicine

## 2021-08-01 DIAGNOSIS — Z6841 Body Mass Index (BMI) 40.0 and over, adult: Secondary | ICD-10-CM | POA: Diagnosis not present

## 2021-08-01 DIAGNOSIS — F3132 Bipolar disorder, current episode depressed, moderate: Secondary | ICD-10-CM | POA: Diagnosis present

## 2021-08-01 DIAGNOSIS — E559 Vitamin D deficiency, unspecified: Secondary | ICD-10-CM | POA: Diagnosis not present

## 2021-08-01 DIAGNOSIS — N979 Female infertility, unspecified: Secondary | ICD-10-CM | POA: Diagnosis present

## 2021-08-01 DIAGNOSIS — F32A Depression, unspecified: Secondary | ICD-10-CM | POA: Diagnosis present

## 2021-08-01 DIAGNOSIS — R45851 Suicidal ideations: Secondary | ICD-10-CM | POA: Diagnosis not present

## 2021-08-01 DIAGNOSIS — R030 Elevated blood-pressure reading, without diagnosis of hypertension: Secondary | ICD-10-CM | POA: Diagnosis not present

## 2021-08-01 DIAGNOSIS — Z90710 Acquired absence of both cervix and uterus: Secondary | ICD-10-CM | POA: Diagnosis not present

## 2021-08-01 DIAGNOSIS — E669 Obesity, unspecified: Secondary | ICD-10-CM | POA: Insufficient documentation

## 2021-08-01 DIAGNOSIS — F329 Major depressive disorder, single episode, unspecified: Secondary | ICD-10-CM | POA: Diagnosis present

## 2021-08-01 DIAGNOSIS — F411 Generalized anxiety disorder: Secondary | ICD-10-CM | POA: Diagnosis not present

## 2021-08-01 DIAGNOSIS — G43909 Migraine, unspecified, not intractable, without status migrainosus: Secondary | ICD-10-CM | POA: Diagnosis not present

## 2021-08-01 DIAGNOSIS — Z20822 Contact with and (suspected) exposure to covid-19: Secondary | ICD-10-CM | POA: Insufficient documentation

## 2021-08-01 DIAGNOSIS — G459 Transient cerebral ischemic attack, unspecified: Secondary | ICD-10-CM | POA: Diagnosis present

## 2021-08-01 DIAGNOSIS — R531 Weakness: Secondary | ICD-10-CM

## 2021-08-01 DIAGNOSIS — Z046 Encounter for general psychiatric examination, requested by authority: Secondary | ICD-10-CM | POA: Diagnosis not present

## 2021-08-01 DIAGNOSIS — Z720 Tobacco use: Secondary | ICD-10-CM | POA: Diagnosis present

## 2021-08-01 LAB — SALICYLATE LEVEL: Salicylate Lvl: <7 mg/dL — ABNORMAL LOW (ref 7.0–30.0)

## 2021-08-01 LAB — COMPREHENSIVE METABOLIC PANEL
ALT: 26 U/L (ref 0–44)
AST: 20 U/L (ref 15–41)
Albumin: 3.8 g/dL (ref 3.5–5.0)
Alkaline Phosphatase: 88 U/L (ref 38–126)
Anion gap: 7 (ref 5–15)
BUN: 8 mg/dL (ref 6–20)
CO2: 26 mmol/L (ref 22–32)
Calcium: 8.9 mg/dL (ref 8.9–10.3)
Chloride: 106 mmol/L (ref 98–111)
Creatinine, Ser: 0.92 mg/dL (ref 0.44–1.00)
GFR, Estimated: >60 mL/min (ref >60)
Glucose, Bld: 117 mg/dL — ABNORMAL HIGH (ref 70–99)
Potassium: 3.7 mmol/L (ref 3.5–5.1)
Sodium: 139 mmol/L (ref 135–145)
Total Bilirubin: 1.1 mg/dL (ref 0.3–1.2)
Total Protein: 6.7 g/dL (ref 6.5–8.1)

## 2021-08-01 LAB — URINE DRUG SCREEN, QUALITATIVE (ARMC ONLY)
Amphetamines, Ur Screen: NOT DETECTED
Barbiturates, Ur Screen: NOT DETECTED
Benzodiazepine, Ur Scrn: NOT DETECTED
Cannabinoid 50 Ng, Ur ~~LOC~~: POSITIVE — AB
Cocaine Metabolite,Ur ~~LOC~~: NOT DETECTED
MDMA (Ecstasy)Ur Screen: NOT DETECTED
Methadone Scn, Ur: NOT DETECTED
Opiate, Ur Screen: NOT DETECTED
Phencyclidine (PCP) Ur S: NOT DETECTED
Tricyclic, Ur Screen: NOT DETECTED

## 2021-08-01 LAB — CBC
HCT: 36 % (ref 36.0–46.0)
Hemoglobin: 12.3 g/dL (ref 12.0–15.0)
MCH: 30.1 pg (ref 26.0–34.0)
MCHC: 34.2 g/dL (ref 30.0–36.0)
MCV: 88.2 fL (ref 80.0–100.0)
Platelets: 158 10*3/uL (ref 150–400)
RBC: 4.08 MIL/uL (ref 3.87–5.11)
RDW: 12.5 % (ref 11.5–15.5)
WBC: 5.7 10*3/uL (ref 4.0–10.5)
nRBC: 0 % (ref 0.0–0.2)

## 2021-08-01 LAB — EXERCISE TOLERANCE TEST
Estimated workload: 6.8
Exercise duration (min): 5 min
Exercise duration (sec): 0 s
MPHR: 183 {beats}/min
Peak HR: 139 {beats}/min
Percent HR: 75 %
Rest HR: 74 {beats}/min

## 2021-08-01 LAB — ACETAMINOPHEN LEVEL: Acetaminophen (Tylenol), Serum: <10 ug/mL — ABNORMAL LOW (ref 10–30)

## 2021-08-01 LAB — ETHANOL: Alcohol, Ethyl (B): <10 mg/dL (ref <10)

## 2021-08-01 LAB — POC URINE PREG, ED: Preg Test, Ur: NEGATIVE

## 2021-08-01 NOTE — ED Triage Notes (Signed)
"  I have mental issues and I've been dealing with a lot of darkness." Reports taking herself off of her medications several years ago without physician guidance. Reports SI without intent. But does report plan for OD should she get to that point. Pt voluntary seeking assistance at this time.

## 2021-08-02 DIAGNOSIS — F4312 Post-traumatic stress disorder, chronic: Secondary | ICD-10-CM | POA: Diagnosis not present

## 2021-08-02 DIAGNOSIS — G459 Transient cerebral ischemic attack, unspecified: Secondary | ICD-10-CM | POA: Diagnosis not present

## 2021-08-02 DIAGNOSIS — N979 Female infertility, unspecified: Secondary | ICD-10-CM | POA: Diagnosis not present

## 2021-08-02 DIAGNOSIS — E669 Obesity, unspecified: Secondary | ICD-10-CM | POA: Diagnosis not present

## 2021-08-02 DIAGNOSIS — E785 Hyperlipidemia, unspecified: Secondary | ICD-10-CM | POA: Diagnosis not present

## 2021-08-02 DIAGNOSIS — F411 Generalized anxiety disorder: Secondary | ICD-10-CM | POA: Diagnosis not present

## 2021-08-02 DIAGNOSIS — R030 Elevated blood-pressure reading, without diagnosis of hypertension: Secondary | ICD-10-CM | POA: Diagnosis not present

## 2021-08-02 DIAGNOSIS — I679 Cerebrovascular disease, unspecified: Secondary | ICD-10-CM | POA: Diagnosis not present

## 2021-08-02 DIAGNOSIS — F332 Major depressive disorder, recurrent severe without psychotic features: Secondary | ICD-10-CM | POA: Diagnosis not present

## 2021-08-02 DIAGNOSIS — F3132 Bipolar disorder, current episode depressed, moderate: Secondary | ICD-10-CM

## 2021-08-02 DIAGNOSIS — I4581 Long QT syndrome: Secondary | ICD-10-CM | POA: Diagnosis not present

## 2021-08-02 DIAGNOSIS — Z90710 Acquired absence of both cervix and uterus: Secondary | ICD-10-CM | POA: Diagnosis not present

## 2021-08-02 DIAGNOSIS — R45851 Suicidal ideations: Secondary | ICD-10-CM | POA: Diagnosis not present

## 2021-08-02 DIAGNOSIS — Z6841 Body Mass Index (BMI) 40.0 and over, adult: Secondary | ICD-10-CM | POA: Diagnosis not present

## 2021-08-02 DIAGNOSIS — Z046 Encounter for general psychiatric examination, requested by authority: Secondary | ICD-10-CM | POA: Diagnosis not present

## 2021-08-02 DIAGNOSIS — E559 Vitamin D deficiency, unspecified: Secondary | ICD-10-CM | POA: Diagnosis not present

## 2021-08-02 DIAGNOSIS — G43909 Migraine, unspecified, not intractable, without status migrainosus: Secondary | ICD-10-CM | POA: Diagnosis not present

## 2021-08-02 DIAGNOSIS — I10 Essential (primary) hypertension: Secondary | ICD-10-CM | POA: Diagnosis not present

## 2021-08-02 DIAGNOSIS — Z8679 Personal history of other diseases of the circulatory system: Secondary | ICD-10-CM | POA: Diagnosis not present

## 2021-08-02 DIAGNOSIS — F1999 Other psychoactive substance use, unspecified with unspecified psychoactive substance-induced disorder: Secondary | ICD-10-CM | POA: Diagnosis not present

## 2021-08-02 DIAGNOSIS — Z20822 Contact with and (suspected) exposure to covid-19: Secondary | ICD-10-CM | POA: Diagnosis not present

## 2021-08-02 DIAGNOSIS — K219 Gastro-esophageal reflux disease without esophagitis: Secondary | ICD-10-CM | POA: Diagnosis not present

## 2021-08-02 LAB — RESP PANEL BY RT-PCR (FLU A&B, COVID) ARPGX2
Influenza A by PCR: NEGATIVE
Influenza B by PCR: NEGATIVE
SARS Coronavirus 2 by RT PCR: NEGATIVE

## 2021-08-02 MED ORDER — ATORVASTATIN CALCIUM 20 MG PO TABS
10.0000 mg | ORAL_TABLET | Freq: Every day | ORAL | Status: DC
  Administered 2021-08-02: 10 mg via ORAL
  Filled 2021-08-02: qty 1

## 2021-08-02 MED ORDER — PANTOPRAZOLE SODIUM 40 MG PO TBEC
40.0000 mg | DELAYED_RELEASE_TABLET | Freq: Every day | ORAL | Status: DC
  Administered 2021-08-02: 40 mg via ORAL
  Filled 2021-08-02: qty 1

## 2021-08-02 MED ORDER — NADOLOL 40 MG PO TABS
40.0000 mg | ORAL_TABLET | Freq: Every day | ORAL | Status: DC
  Administered 2021-08-02: 40 mg via ORAL
  Filled 2021-08-02: qty 1

## 2021-08-02 MED ORDER — ACETAMINOPHEN 325 MG PO TABS
650.0000 mg | ORAL_TABLET | Freq: Once | ORAL | Status: AC
Start: 2021-08-02 — End: 2021-08-02
  Administered 2021-08-02: 650 mg via ORAL
  Filled 2021-08-02: qty 2

## 2021-08-02 MED ORDER — ASPIRIN 81 MG PO TBEC
81.0000 mg | DELAYED_RELEASE_TABLET | Freq: Every day | ORAL | Status: DC
  Administered 2021-08-02: 81 mg via ORAL
  Filled 2021-08-02: qty 1

## 2021-08-02 NOTE — ED Notes (Signed)
Breakfast tray and beverage provided 

## 2021-08-02 NOTE — ED Notes (Signed)
Pt awake, a/o, given juice

## 2021-08-02 NOTE — Consult Note (Signed)
[facility] Face-to-Face Psychiatry Consult   Reason for Consult:Psychiatric Evaluation Referring Physician: Dr. Katrinka Blazing Patient Identification: Holly Nicholson MRN:  [id number] Principal Diagnosis: <principal problem not specified> Diagnosis:  Active Problems:   Generalized anxiety disorder   Bipolar affective disorder, currently depressed, moderate (HCC)   Tobacco abuse   Transient elevated blood pressure   Vitamin D deficiency   Morbid obesity (HCC)   BMI 45.0-49.9, adult [facility])   Female sterility   Depression   S/P laparoscopic hysterectomy   MDD (major depressive disorder)   TIA (transient ischemic attack)   Left-sided weakness   Migraine   Total Time spent with patient: 1 hour  Subjective: "I have a lot going on in my life." Holly Nicholson is a 36 y.o. female patient presented to [facility] ED via POV with her husband by her side. The patient is here under involuntary commitment status (IVC). Per the ED triage nurses note, "I have mental issues and I've been dealing with a lot of darkness." Reports taking herself off of her medications several years ago without physician guidance. Reports SI without intent. But does report plan for OD should she get to that point. Pt voluntary seeking assistance at this time.  This provider saw The patient face-to-face; the chart was reviewed, and consulted with Dr. Katrinka Blazing on 08/01/2021 due to the patient's care. It was discussed with the EDP that the patient does meet the criteria to be admitted to the psychiatric inpatient unit.  On evaluation, the patient is alert and oriented x 4, calm, cooperative, and mood-congruent with affect. The patient does not appear to be responding to internal or external stimuli. Neither is the patient presenting with any delusional thinking. The patient denies auditory or visual hallucinations. The patient admits to suicidal ideation but denies homicidal or self-harm ideations. The patient is not presenting with any psychotic or  paranoid behaviors. During an encounter with the patient, she could answer questions appropriately.  HPI: Per Dr. Katrinka Blazing, Holly Nicholson is a 36 y.o. female who presents to the ED for evaluation of Psychiatric Evaluation I reviewed cardiology clinic visit from 3/23.  History of long QTc syndrome. Patient presents to the ED with her husband today for evaluation of increasing chronic depression. She reports taking herself off of her psychiatric medications 2 or 3 years ago.  Reports for the past week or 2 she has been having increasing depression and thoughts of harming herself.  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  Past Psychiatric History:  Depression Anxiety  Risk to Self:   Risk to Others:   Prior Inpatient Therapy:   Prior Outpatient Therapy:    Past Medical History:  Past Medical History:  Diagnosis Date   Anxiety    BMI 45.0-49.9, adult (HCC)    Depression    Elevated blood pressure    GERD (gastroesophageal reflux disease)    History of bilateral tubal ligation 2007   History of bilateral tubal ligation 2007   Hypertension    Low-lying placenta    Lower extremity edema    Tubal pregnancy Aug 2016    Past Surgical History:  Procedure Laterality Date   ABDOMINAL HYSTERECTOMY     CHOLECYSTECTOMY N/A 09/02/2015   Procedure: LAPAROSCOPIC CHOLECYSTECTOMY;  Surgeon: Leafy Ro, MD;  Location: [facility] ORS;  Service: General;  Laterality: N/A;   CHOLECYSTECTOMY     KNEE SURGERY     LAPAROSCOPIC HYSTERECTOMY Bilateral 10/30/2017   Procedure: HYSTERECTOMY TOTAL LAPAROSCOPIC-BILATERAL SALPINGECTOMY;  Surgeon: Vena Austria, MD;  Location: [facility] ORS;  Service: Gynecology;  Laterality: Bilateral;   LAPAROSCOPIC SALPINGO OOPHERECTOMY N/A 10/19/2016   Procedure: LAPAROSCOPIC SALPINGECTOMY  bilateral;  Surgeon: Nadara Mustard, MD;  Location: [facility] ORS;  Service: Gynecology;  Laterality: N/A;   TUBAL LIGATION     TUBAL LIGATION Bilateral    Tubal reanastamosis     Family History:  Family History  Problem Relation Age of Onset   Heart disease Mother    Hyperlipidemia Mother    Hypertension Mother    Depression Mother    Anxiety disorder Mother    Diabetes Father    Heart disease Father    Hyperlipidemia Father    Hypertension Father    Stroke Father    Cancer Maternal Grandfather        lung   Cancer Paternal Grandmother        stomach   Epilepsy Daughter    Asthma Son    Stroke Paternal Grandfather    Depression Maternal Aunt    Anxiety disorder Maternal Aunt    Family Psychiatric  History:  Social History:  Social History   Substance and Sexual Activity  Alcohol Use Not Currently     Social History   Substance and Sexual Activity  Drug Use Not Currently   Types: Marijuana    Social History   Socioeconomic History   Marital status: Married    Spouse name: Not on file   Number of children: 4   Years of education: Not on file   Highest education level: Some college, no degree  Occupational History   Not on file  Tobacco Use   Smoking status: Every Day    Packs/day: 0.50    Types: Cigarettes   Smokeless tobacco: Never  Vaping Use   Vaping Use: Never used  Substance and Sexual Activity   Alcohol use: Not Currently   Drug use: Not Currently    Types: Marijuana   Sexual activity: Yes    Birth control/protection: Surgical  Other Topics Concern   Not on file  Social History Narrative   ** Merged History Encounter **       Social Determinants of Health   Financial Resource Strain: Medium Risk (05/17/2017)   Overall Financial Resource Strain (CARDIA)    Difficulty of Paying Living Expenses: Somewhat hard  Food Insecurity: Food Insecurity Present (05/05/2018)   Hunger Vital Sign    Worried About Running Out of Food in the Last Year:  Sometimes true    Ran Out of Food in the Last Year: Sometimes true  Transportation Needs: No Transportation Needs (05/17/2017)   PRAPARE - Administrator, Civil Service (Medical): No    Lack of Transportation (Non-Medical): No  Physical Activity: Inactive (05/17/2017)   Exercise Vital Sign    Days of Exercise per Week: 0 days    Minutes of Exercise per Session: 0 min  Stress: Stress Concern Present (05/17/2017)   [employer] of Occupational Health - Occupational Stress Questionnaire    Feeling of Stress : Very much  Social Connections: Somewhat Isolated (05/17/2017)   Social Connection and Isolation Panel [NHANES]    Frequency of Communication with Friends and Family: More than three times a week    Frequency of Social Gatherings with Friends and Family: Never    Attends Religious  Services: More than 4 times per year    Active Member of Clubs or Organizations: No    Attends Banker Meetings: Never    Marital Status: Divorced   Additional Social History:    Allergies:   Allergies  Allergen Reactions   Onion Anaphylaxis, Swelling and Hives    Raw onions causes throat to swell   Latex Dermatitis    Labs:  Results for orders placed or performed during the hospital encounter of 08/01/21 (from the past 48 hour(s))  POC urine preg, ED     Status: None   Collection Time: 08/01/21  9:47 PM  Result Value Ref Range   Preg Test, Ur NEGATIVE NEGATIVE    Comment:        THE SENSITIVITY OF THIS METHODOLOGY IS >24 mIU/mL   Urine Drug Screen, Qualitative     Status: Abnormal   Collection Time: 08/01/21  9:51 PM  Result Value Ref Range   Tricyclic, Ur Screen NONE DETECTED NONE DETECTED   Amphetamines, Ur Screen NONE DETECTED NONE DETECTED   MDMA (Ecstasy)Ur Screen NONE DETECTED NONE DETECTED   Cocaine Metabolite,Ur [place] NONE DETECTED NONE DETECTED   Opiate, Ur Screen NONE DETECTED NONE DETECTED   Phencyclidine (PCP) Ur S NONE DETECTED NONE DETECTED   Cannabinoid  50 Ng, Ur [place] POSITIVE (A) NONE DETECTED   Barbiturates, Ur Screen NONE DETECTED NONE DETECTED   Benzodiazepine, Ur Scrn NONE DETECTED NONE DETECTED   Methadone Scn, Ur NONE DETECTED NONE DETECTED    Comment: (NOTE) Tricyclics + metabolites, urine    Cutoff 1000 ng/mL Amphetamines + metabolites, urine  Cutoff 1000 ng/mL MDMA (Ecstasy), urine              Cutoff 500 ng/mL Cocaine Metabolite, urine          Cutoff 300 ng/mL Opiate + metabolites, urine        Cutoff 300 ng/mL Phencyclidine (PCP), urine         Cutoff 25 ng/mL Cannabinoid, urine                 Cutoff 50 ng/mL Barbiturates + metabolites, urine  Cutoff 200 ng/mL Benzodiazepine, urine              Cutoff 200 ng/mL Methadone, urine                   Cutoff 300 ng/mL  The urine drug screen provides only a preliminary, unconfirmed analytical test result and should not be used for non-medical purposes. Clinical consideration and professional judgment should be applied to any positive drug screen result due to possible interfering substances. A more specific alternate chemical method must be used in order to obtain a confirmed analytical result. Gas chromatography / mass spectrometry (GC/MS) is the preferred confirm atory method. Performed at [facility], [address] [place], [place] [address]   Comprehensive metabolic panel     Status: Abnormal   Collection Time: 08/01/21 10:08 PM  Result Value Ref Range   Sodium 139 135 - 145 mmol/L   Potassium 3.7 3.5 - 5.1 mmol/L   Chloride 106 98 - 111 mmol/L   CO2 26 22 - 32 mmol/L   Glucose, Bld 117 (H) 70 - 99 mg/dL    Comment: Glucose reference range applies only to samples taken after fasting for at least 8 hours.   BUN 8 6 - 20 mg/dL   Creatinine, Ser 6.21 0.44 - 1.00 mg/dL   Calcium 8.9 8.9 -  10.3 mg/dL   Total Protein 6.7 6.5 - 8.1 g/dL   Albumin 3.8 3.5 - 5.0 g/dL   AST 20 15 - 41 U/L   ALT 26 0 - 44 U/L   Alkaline Phosphatase 88 38 - 126 U/L   Total  Bilirubin 1.1 0.3 - 1.2 mg/dL   GFR, Estimated >16>60 >10>60 mL/min    Comment: (NOTE) Calculated using the CKD-EPI Creatinine Equation (2021)    Anion gap 7 5 - 15    Comment: Performed at [facility], [address] [place], [place] [address]  Ethanol     Status: None   Collection Time: 08/01/21 10:08 PM  Result Value Ref Range   Alcohol, Ethyl (B) <10 <10 mg/dL    Comment: (NOTE) Lowest detectable limit for serum alcohol is 10 mg/dL.  For medical purposes only. Performed at [facility], [address] [place], [place] [address]   Salicylate level     Status: Abnormal   Collection Time: 08/01/21 10:08 PM  Result Value Ref Range   Salicylate Lvl <7.0 (L) 7.0 - 30.0 mg/dL    Comment: Performed at [facility], [address] [place], [place] [address]  Acetaminophen level     Status: Abnormal   Collection Time: 08/01/21 10:08 PM  Result Value Ref Range   Acetaminophen (Tylenol), Serum <10 (L) 10 - 30 ug/mL    Comment: (NOTE) Therapeutic concentrations vary significantly. A range of 10-30 ug/mL  may be an effective concentration for many patients. However, some  are best treated at concentrations outside of this range. Acetaminophen concentrations >150 ug/mL at 4 hours after ingestion  and >50 ug/mL at 12 hours after ingestion are often associated with  toxic reactions.  Performed at [facility], [address] [place], [place] [address]   cbc     Status: None   Collection Time: 08/01/21 10:08 PM  Result Value Ref Range   WBC 5.7 4.0 - 10.5 K/uL   RBC 4.08 3.87 - 5.11 MIL/uL   Hemoglobin 12.3 12.0 - 15.0 g/dL   HCT 21.336.0 08.636.0 - 57.846.0 %   MCV 88.2 80.0 - 100.0 fL   MCH 30.1 26.0 - 34.0 pg   MCHC 34.2 30.0 - 36.0 g/dL   RDW 46.912.5 62.911.5 - 52.815.5 %   Platelets 158 150 - 400 K/uL   nRBC 0.0 0.0 - 0.2 %    Comment: Performed at [facility], [address] [place], [place] [address]    No current facility-administered  medications for this encounter.   Current Outpatient Medications  Medication Sig Dispense Refill   aspirin EC 81 MG EC tablet Take 1 tablet (81 mg total) by mouth daily. Swallow whole. 30 tablet 11   atorvastatin (LIPITOR) 10 MG tablet TAKE 1 TABLET BY MOUTH EVERY DAY 90 tablet 2   meclizine (ANTIVERT) 25 MG tablet Take 25 mg by mouth 3 (three) times daily.     nadolol (CORGARD) 40 MG tablet Take 1 tablet (40 mg total) by mouth daily. 90 tablet 2   nitroGLYCERIN (NITROSTAT) 0.4 MG SL tablet Place 1 tablet (0.4 mg total) under the tongue every 5 (five) minutes as needed for chest pain. 20 tablet 0   pantoprazole (PROTONIX) 40 MG tablet Take 1 tablet (40 mg total) by mouth daily. 30 tablet 11   Facility-Administered Medications Ordered in Other Encounters  Medication Dose Route Frequency Provider Last Rate Last Admin   loratadine (CLARITIN) tablet 10 mg  10 mg  Oral Daily PRN Reggie Pile, MD       pantoprazole (PROTONIX) EC tablet 40 mg  40 mg Oral Daily Reggie Pile, MD        Musculoskeletal: Strength & Muscle Tone: within normal limits Gait & Station: normal Patient leans: N/A  Psychiatric Specialty Exam:  Presentation  General Appearance: Appropriate for Environment  Eye Contact:Good  Speech:Clear and Coherent  Speech Volume:Normal  Handedness:Right   Mood and Affect  Mood:Anxious; Depressed  Affect:Blunt; Congruent   Thought Process  Thought Processes:Coherent  Descriptions of Associations:Intact  Orientation:Full (Time, Place and Person)  Thought Content:Logical  History of Schizophrenia/Schizoaffective disorder:No data recorded Duration of Psychotic Symptoms:No data recorded Hallucinations:Hallucinations: None  Ideas of Reference:None  Suicidal Thoughts:Suicidal Thoughts: Yes, Passive SI Passive Intent and/or Plan: Without Plan; Without Intent; With Means to Carry Out; With Access to Means  Homicidal Thoughts:Homicidal Thoughts: No   Sensorium   Memory:Immediate Good; Recent Good; Remote Good  Judgment:Poor  Insight:Lacking   Executive Functions  Concentration:Fair  Attention Span:Fair  Recall:Good  Fund of Knowledge:Good  Language:Good   Psychomotor Activity  Psychomotor Activity:Psychomotor Activity: Normal   Assets  Assets:Communication Skills; Desire for Improvement; Leisure Time; Physical Health; Resilience; Social Support   Sleep  Sleep:Sleep: Poor Number of Hours of Sleep: 2   Physical Exam: Physical Exam Vitals and nursing note reviewed. Exam conducted with a chaperone present.  Constitutional:      Appearance: Normal appearance. She is obese.  HENT:     Head: Normocephalic and atraumatic.     Right Ear: External ear normal.     Left Ear: External ear normal.     Nose: Nose normal.  Cardiovascular:     Rate and Rhythm: Tachycardia present.  Pulmonary:     Effort: Pulmonary effort is normal.  Musculoskeletal:        General: Normal range of motion.     Cervical back: Normal range of motion and neck supple.  Neurological:     General: No focal deficit present.     Mental Status: She is alert and oriented to person, place, and time.  Psychiatric:        Attention and Perception: Attention and perception normal.        Mood and Affect: Mood is anxious and depressed. Affect is blunt.        Speech: Speech normal.        Behavior: Behavior normal. Behavior is cooperative.        Thought Content: Thought content includes suicidal ideation. Thought content includes suicidal plan.        Cognition and Memory: Cognition and memory normal.        Judgment: Judgment is impulsive and inappropriate.    Review of Systems  Psychiatric/Behavioral:  Positive for depression and suicidal ideas. The patient is nervous/anxious and has insomnia.   All other systems reviewed and are negative.  Blood pressure 129/68, pulse (!) 122, temperature 98.1 F (36.7 C), temperature source Oral, resp. rate 18,  height 5' (1.524 m), weight 113.4 kg, last menstrual period 08/13/2017, SpO2 95 %. Body mass index is 48.82 kg/m.  Treatment Plan Summary: Plan Patient does meet the criteria for psychiatric inpatient admission  Disposition: Recommend psychiatric Inpatient admission when medically cleared. Supportive therapy provided about ongoing stressors.  Gillermo Murdoch, NP 08/02/2021 2:15 AM

## 2021-08-02 NOTE — ED Provider Notes (Signed)
[facility] Provider Note    Event Date/Time   First MD Initiated Contact with Patient 08/01/21 2257     (approximate)   History   Psychiatric Evaluation   HPI  Holly Nicholson is a 36 y.o. female who presents to the ED for evaluation of Psychiatric Evaluation   I reviewed cardiology clinic visit from 3/23.  History of long QTc syndrome.  Patient presents to the ED with her husband today for evaluation of increasing chronic depression.  She reports taking herself off of her psychiatric medications 2 or 3 years ago.  Reports for the past week or 2 she has been having increasing depression and thoughts of harming herself.  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   Physical Exam   Triage Vital Signs: ED Triage Vitals  Enc Vitals Group     BP 08/01/21 2047 129/68     Pulse Rate 08/01/21 2047 (!) 122     Resp 08/01/21 2047 18     Temp 08/01/21 2047 98.1 F (36.7 C)     Temp Source 08/01/21 2047 Oral     SpO2 08/01/21 2047 95 %     Weight 08/01/21 2048 250 lb (113.4 kg)     Height 08/01/21 2048 5' (1.524 m)     Head Circumference --      Peak Flow --      Pain Score 08/01/21 2047 0     Pain Loc --      Pain Edu? --      Excl. in GC? --     Most recent vital signs: Vitals:   08/01/21 2047  BP: 129/68  Pulse: (!) 122  Resp: 18  Temp: 98.1 F (36.7 C)  SpO2: 95%    General: Awake, no distress.  Obese, ambulatory to the bathroom independently.  Systemically well-appearing. CV:  Good peripheral perfusion.  RRR without tachycardia during my evaluation Resp:  Normal effort.  Abd:  No distention.  Soft and benign MSK:  No deformity noted.  Neuro:  No focal deficits appreciated. Other:     ED Results / Procedures / Treatments   Labs (all labs  ordered are listed, but only abnormal results are displayed) Labs Reviewed  COMPREHENSIVE METABOLIC PANEL - Abnormal; Notable for the following components:      Result Value   Glucose, Bld 117 (*)    All other components within normal limits  SALICYLATE LEVEL - Abnormal; Notable for the following components:   Salicylate Lvl <7.0 (*)    All other components within normal limits  ACETAMINOPHEN LEVEL - Abnormal; Notable for the following components:   Acetaminophen (Tylenol), Serum <10 (*)    All other components within normal limits  URINE DRUG SCREEN, QUALITATIVE ([facility] ONLY) - Abnormal; Notable for the following components:   Cannabinoid 50 Ng, Ur [place] POSITIVE (*)    All other components within normal limits  RESP PANEL BY RT-PCR (FLU A&B, COVID) ARPGX2  ETHANOL  CBC  POC URINE PREG, ED    EKG   RADIOLOGY   Official radiology report(s): No results found.  PROCEDURES and INTERVENTIONS:  Procedures  Medications - No data to display   IMPRESSION / MDM / ASSESSMENT AND PLAN / ED COURSE  I reviewed the triage vital signs and the  nursing notes.  Differential diagnosis includes, but is not limited to, overdose, malingering, noncompliance, suicidal  {Patient presents with symptoms of an acute illness or injury that is potentially life-threatening.  Patient presents with increasing suicidal thoughts requiring psychiatric inpatient stay.  Noted to be tachycardic in triage, but not tachycardic during my evaluation.  She looks systemically well without signs of medical pathology to preclude psychiatric evaluation and disposition.  No syncopal episodes to suggest symptomatic long QTc syndrome.  Normal electrolytes, no signs of toxidromes.  Normal CBC.  Clinical Course as of 08/02/21 0008  Mon Aug 01, 2021  2347 The patient has been placed in psychiatric observation due to the need to provide a safe environment for the patient while obtaining psychiatric consultation and evaluation,  as well as ongoing medical and medication management to treat the patient's condition.  The patient has not been placed under full IVC at this time.   [DS]  Tue Aug 02, 2021  0005 I consult with psychiatric team nurse practitioner who recommends inpatient stay.  Can stay voluntary for now but should be IVC if she tries to leave. [DS]    Clinical Course User Index [DS] Delton Prairie, MD     FINAL CLINICAL IMPRESSION(S) / ED DIAGNOSES   Final diagnoses:  Suicidal ideation  Acute depression     Rx / DC Orders   ED Discharge Orders     None        Note:  This document was prepared using Dragon voice recognition software and may include unintentional dictation errors.   Delton Prairie, MD 08/02/21 [phone]

## 2021-08-02 NOTE — BH Assessment (Signed)
Referral information for Psychiatric Hospitalization faxed to:  [facility] ([phone]-or- [phone]),   [facility] ([phone]---[phone]),  [facility] ([phone]),   [facility] ([phone] -or- [phone]),   [facility] ([phone]).  [facility] ([phone])  

## 2021-08-02 NOTE — ED Notes (Signed)
Safe Transport  called  for  pt  going  to  [facility]

## 2021-08-02 NOTE — ED Notes (Signed)
VOL/Consult pending  

## 2021-08-02 NOTE — BH Assessment (Addendum)
Patient has been accepted to [facility].  Patient assigned to [facility] Accepting physician is Dr. Marland Mcalpine.  Call report to [phone].   ER Staff is aware of it:  [name], ER Secretary  Will, Patient's Nurse  Pt is accepted to [facility] pt can arrive anytime after 10 AM.
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
## 2021-08-02 NOTE — ED Notes (Signed)
VOL/Consult completed/Rec.Inpt.Admit 

## 2021-08-11 ENCOUNTER — Encounter: Payer: Self-pay | Admitting: Internal Medicine

## 2021-08-11 DIAGNOSIS — F411 Generalized anxiety disorder: Secondary | ICD-10-CM | POA: Diagnosis not present

## 2021-08-11 DIAGNOSIS — F900 Attention-deficit hyperactivity disorder, predominantly inattentive type: Secondary | ICD-10-CM | POA: Diagnosis not present

## 2021-08-11 DIAGNOSIS — F4312 Post-traumatic stress disorder, chronic: Secondary | ICD-10-CM | POA: Diagnosis not present

## 2021-08-11 DIAGNOSIS — F3189 Other bipolar disorder: Secondary | ICD-10-CM | POA: Diagnosis not present

## 2021-09-08 ENCOUNTER — Ambulatory Visit: Payer: BC Managed Care – PPO | Attending: Internal Medicine | Admitting: Internal Medicine

## 2021-09-08 ENCOUNTER — Encounter: Payer: Self-pay | Admitting: Internal Medicine

## 2021-09-08 VITALS — BP 120/90 | HR 60 | Ht 60.0 in | Wt 261.1 lb

## 2021-09-08 DIAGNOSIS — E782 Mixed hyperlipidemia: Secondary | ICD-10-CM | POA: Diagnosis not present

## 2021-09-08 DIAGNOSIS — Z01812 Encounter for preprocedural laboratory examination: Secondary | ICD-10-CM

## 2021-09-08 DIAGNOSIS — R9431 Abnormal electrocardiogram [ECG] [EKG]: Secondary | ICD-10-CM

## 2021-09-08 DIAGNOSIS — I10 Essential (primary) hypertension: Secondary | ICD-10-CM | POA: Diagnosis not present

## 2021-09-08 DIAGNOSIS — F4312 Post-traumatic stress disorder, chronic: Secondary | ICD-10-CM | POA: Diagnosis not present

## 2021-09-08 DIAGNOSIS — F900 Attention-deficit hyperactivity disorder, predominantly inattentive type: Secondary | ICD-10-CM | POA: Diagnosis not present

## 2021-09-08 DIAGNOSIS — R072 Precordial pain: Secondary | ICD-10-CM

## 2021-09-08 DIAGNOSIS — F3189 Other bipolar disorder: Secondary | ICD-10-CM | POA: Diagnosis not present

## 2021-09-08 DIAGNOSIS — F411 Generalized anxiety disorder: Secondary | ICD-10-CM | POA: Diagnosis not present

## 2021-09-08 NOTE — Progress Notes (Addendum)
Patient Care Team: Pcp, No as PCP - General Patient, No Pcp Per (General Practice)   HPI  Holly Nicholson is a 36 y.o. female Seen in follow-up following an admission for chest pain with typical and atypical features that was associated for a short period of time with profound QT prolongation up to about 600 ms which then corrected over a number of hours to less than 450.  Her QTc in the emergency room had been about 460. No family history of syncope or sudden death  She noted that her mother and father both had heart conditions, their charts were reviewed as she comes in today with some degree of asymmetric swelling which is actually longstanding.  Her mother was on Coumadin with a reported BMI of 70 and cor pulmonale.  Her father had left ventricular dysfunction.  Treadmill testing was associate with normal QT behaviors  Overall doing reasonably well complaints of chest pain.  Very anxiety provoking.  Not necessarily associated with exertion.  Strong family history contributing.  Mild dyspnea on exertion.  No edema.  No palpitations or syncope.    Date Cr K Hgb  02/04/21 0.97 3.4 12.6  02/05/21 0.77 3.7    7/23 0.92 3.7 12.3     Records and Results Reviewed  yes  Past Medical History:  Diagnosis Date   Anxiety    BMI 45.0-49.9, adult (HCC)    Depression    Elevated blood pressure    GERD (gastroesophageal reflux disease)    History of bilateral tubal ligation 2007   History of bilateral tubal ligation 2007   Hypertension    Low-lying placenta    Lower extremity edema    Tubal pregnancy Aug 2016    Past Surgical History:  Procedure Laterality Date   ABDOMINAL HYSTERECTOMY     CHOLECYSTECTOMY N/A 09/02/2015   Procedure: LAPAROSCOPIC CHOLECYSTECTOMY;  Surgeon: Leafy Ro, MD;  Location: [facility] ORS;  Service: General;  Laterality: N/A;   CHOLECYSTECTOMY     KNEE SURGERY     LAPAROSCOPIC HYSTERECTOMY Bilateral 10/30/2017   Procedure: HYSTERECTOMY TOTAL  LAPAROSCOPIC-BILATERAL SALPINGECTOMY;  Surgeon: Vena Austria, MD;  Location: [facility] ORS;  Service: Gynecology;  Laterality: Bilateral;   LAPAROSCOPIC SALPINGO OOPHERECTOMY N/A 10/19/2016   Procedure: LAPAROSCOPIC SALPINGECTOMY bilateral;  Surgeon: Nadara Mustard, MD;  Location: [facility] ORS;  Service: Gynecology;  Laterality: N/A;   TUBAL LIGATION     TUBAL LIGATION Bilateral    Tubal reanastamosis      Current Meds  Medication Sig   aspirin EC 81 MG EC tablet Take 1 tablet (81 mg total) by mouth daily. Swallow whole.   atorvastatin (LIPITOR) 10 MG tablet TAKE 1 TABLET BY MOUTH EVERY DAY   nadolol (CORGARD) 40 MG tablet Take 1 tablet (40 mg total) by mouth daily.   nitroGLYCERIN (NITROSTAT) 0.4 MG SL tablet Place 1 tablet (0.4 mg total) under the tongue every 5 (five) minutes as needed for chest pain.   pantoprazole (PROTONIX) 40 MG tablet Take 1 tablet (40 mg total) by mouth daily.   QUEtiapine (SEROQUEL) 25 MG tablet Take 50 mg by mouth at bedtime.    Allergies  Allergen Reactions   Onion Anaphylaxis, Swelling and Hives    Raw onions causes throat to swell   Latex Dermatitis      Review of Systems negative except from HPI and PMH  Physical Exam BP (!) 120/90 (BP Location: Left Arm, Patient Position: Sitting, Cuff Size: Large)   Pulse  60   Ht 5' (1.524 m)   Wt 261 lb 2 oz (118.4 kg)   LMP 08/13/2017 (Exact Date)   SpO2 98%   BMI 51.00 kg/m  Well developed and nourished in no acute distress HENT normal Neck supple with JVP-  flat  Clear Regular rate and rhythm, no murmurs or gallops Abd-soft with active BS No Clubbing cyanosis edema Skin-warm and dry A & Oriented  Grossly normal sensory and motor function  ECG   sinus at 60 Intervals 15/08/42    CrCl cannot be calculated (Patient's most recent lab result is older than the maximum 21 days allowed.).   Assessment and  Plan QT prolongation-profound-transient  Chest pain with typical and atypical features    Right-sided numbness and vision changes question migraine (prior history)   Tobacco use   Bipolar disorder   Transient profound QT prolongation without clear trigger.  Reviewed QT [email or web address] website.  Chest pain with typical and atypical features, given the anxieties, we will undertake CTA  Encouraged to stop smoking

## 2021-09-08 NOTE — Patient Instructions (Addendum)
Medication Instructions:  - Your physician recommends that you continue on your current medications as directed. Please refer to the Current Medication list given to you today.  *If you need a refill on your cardiac medications before your next appointment, please call your pharmacy*   Lab Work: - Your physician recommends that you return for lab work: any time at your convenience prior to your CT scan:  [facility]  Medical Mall Entrance at [facility], [place] 1st desk on the right to check in (REGISTRATION)  Lab hours: Monday- Friday (7:30 am- 5:30 pm)   If you have labs (blood work) drawn today and your tests are completely normal, you will receive your results only by: MyChart Message (if you have MyChart) OR A paper copy in the mail If you have any lab test that is abnormal or we need to change your treatment, we will call you to review the results.   Testing/Procedures:  1) Cardiac CT angiogram: Monday 09/26/21 @ 8:00 am (arrive at 7:45 am)  - Your physician has requested that you have cardiac CT. Cardiac computed tomography (CT) is a painless test that uses an x-ray machine to take clear, detailed pictures of your heart.   Your cardiac CT will be scheduled at:    [facility] Entrance- check in at the 1st desk on the right (Registration)   Please follow these instructions carefully (unless otherwise directed):   On the Night Before the Test: Be sure to Drink plenty of water. Do not consume any caffeinated/decaffeinated beverages or chocolate 12 hours prior to your test. Do not take any antihistamines 12 hours prior to your test.  On the Day of the Test: Drink plenty of water until 1 hour prior to the test. Do not eat any food 4 hours prior to the test. You may take your regular medications prior to the test.  FEMALES- please wear underwire-free bra if available, avoid dresses & tight clothing       After the Test: Drink plenty of water. After receiving IV contrast, you may  experience a mild flushed feeling. This is normal. On occasion, you may experience a mild rash up to 24 hours after the test. This is not dangerous. If this occurs, you can take Benadryl 25 mg and increase your fluid intake. If you experience trouble breathing, this can be serious. If it is severe call 911 IMMEDIATELY. If it is mild, please call our office.  Some insurance companies will need an authorization prior to the service being performed.   For non-scheduling related questions, please contact the cardiac imaging nurse navigator should you have any questions/concerns: Rockwell Alexandria, Cardiac Imaging Nurse Navigator Larey Brick, Cardiac Imaging Nurse Navigator  [facility] Direct Office Dial: [phone]   For scheduling needs, including cancellations and rescheduling, please call [place], [phone].     Follow-Up: At [facility], you and your health needs are our priority.  As part of our continuing mission to provide you with exceptional heart care, we have created designated Provider Care Teams.  These Care Teams include your primary Cardiologist (physician) and Advanced Practice Providers (APPs -  Physician Assistants and Nurse Practitioners) who all work together to provide you with the care you need, when you need it.  We recommend signing up for the patient portal called "MyChart".  Sign up information is provided on this After Visit Summary.  MyChart is used to connect with patients for Virtual Visits (Telemedicine).  Patients are able to view lab/test  results, encounter notes, upcoming appointments, etc.  Non-urgent messages can be sent to your provider as well.   To learn more about what you can do with MyChart, go to [email or web address].    Your next appointment:   As needed pending the results of your CT scan   The format for your next appointment:   In Person  Provider:   Sherryl Manges, MD    Other Instructions  Cardiac  CT Angiogram A cardiac CT angiogram is a procedure to look at the heart and the area around the heart. It may be done to help find the cause of chest pains or other symptoms of heart disease. During this procedure, a substance called contrast dye is injected into the blood vessels in the area to be checked. A large X-ray machine, called a CT scanner, then takes detailed pictures of the heart and the surrounding area. The procedure is also sometimes called a coronary CT angiogram, coronary artery scanning, or CTA. A cardiac CT angiogram allows the health care provider to see how well blood is flowing to and from the heart. The health care provider will be able to see if there are any problems, such as: Blockage or narrowing of the coronary arteries in the heart. Fluid around the heart. Signs of weakness or disease in the muscles, valves, and tissues of the heart. Tell a health care provider about: Any allergies you have. This is especially important if you have had a previous allergic reaction to contrast dye. All medicines you are taking, including vitamins, herbs, eye drops, creams, and over-the-counter medicines. Any blood disorders you have. Any surgeries you have had. Any medical conditions you have. Whether you are pregnant or may be pregnant. Any anxiety disorders, chronic pain, or other conditions you have that may increase your stress or prevent you from lying still. What are the risks? Generally, this is a safe procedure. However, problems may occur, including: Bleeding. Infection. Allergic reactions to medicines or dyes. Damage to other structures or organs. Kidney damage from the contrast dye that is used. Increased risk of cancer from radiation exposure. This risk is low. Talk with your health care provider about: The risks and benefits of testing. How you can receive the lowest dose of radiation. What happens before the procedure? Wear comfortable clothing and remove any  jewelry, glasses, dentures, and hearing aids. Follow instructions from your health care provider about eating and drinking. This may include: For 12 hours before the procedure -- avoid caffeine. This includes tea, coffee, soda, energy drinks, and diet pills. Drink plenty of water or other fluids that do not have caffeine in them. Being well hydrated can prevent complications. For 4-6 hours before the procedure -- stop eating and drinking. The contrast dye can cause nausea, but this is less likely if your stomach is empty. Ask your health care provider about changing or stopping your regular medicines. This is especially important if you are taking diabetes medicines, blood thinners, or medicines to treat problems with erections (erectile dysfunction). What happens during the procedure?  Hair on your chest may need to be removed so that small sticky patches called electrodes can be placed on your chest. These will transmit information that helps to monitor your heart during the procedure. An IV will be inserted into one of your veins. You might be given a medicine to control your heart rate during the procedure. This will help to ensure that good images are obtained. You will be asked to lie on  an exam table. This table will slide in and out of the CT machine during the procedure. Contrast dye will be injected into the IV. You might feel warm, or you may get a metallic taste in your mouth. You will be given a medicine called nitroglycerin. This will relax or dilate the arteries in your heart. The table that you are lying on will move into the CT machine tunnel for the scan. The person running the machine will give you instructions while the scans are being done. You may be asked to: Keep your arms above your head. Hold your breath. Stay very still, even if the table is moving. When the scanning is complete, you will be moved out of the machine. The IV will be removed. The procedure may vary among  health care providers and hospitals. What can I expect after the procedure? After your procedure, it is common to have: A metallic taste in your mouth from the contrast dye. A feeling of warmth. A headache from the nitroglycerin. Follow these instructions at home: Take over-the-counter and prescription medicines only as told by your health care provider. If you are told, drink enough fluid to keep your urine pale yellow. This will help to flush the contrast dye out of your body. Most people can return to their normal activities right after the procedure. Ask your health care provider what activities are safe for you. It is up to you to get the results of your procedure. Ask your health care provider, or the department that is doing the procedure, when your results will be ready. Keep all follow-up visits as told by your health care provider. This is important. Contact a health care provider if: You have any symptoms of allergy to the contrast dye. These include: Shortness of breath. Rash or hives. A racing heartbeat. Summary A cardiac CT angiogram is a procedure to look at the heart and the area around the heart. It may be done to help find the cause of chest pains or other symptoms of heart disease. During this procedure, a large X-ray machine, called a CT scanner, takes detailed pictures of the heart and the surrounding area after a contrast dye has been injected into blood vessels in the area. Ask your health care provider about changing or stopping your regular medicines before the procedure. This is especially important if you are taking diabetes medicines, blood thinners, or medicines to treat erectile dysfunction. If you are told, drink enough fluid to keep your urine pale yellow. This will help to flush the contrast dye out of your body. This information is not intended to replace advice given to you by your health care provider. Make sure you discuss any questions you have with your  health care provider. Document Revised: 04/14/2021 Document Reviewed: 08/21/2018 Elsevier Patient Education  2023 [employer]   Important Information About Sugar

## 2021-09-15 DIAGNOSIS — F3189 Other bipolar disorder: Secondary | ICD-10-CM | POA: Diagnosis not present

## 2021-09-15 DIAGNOSIS — F411 Generalized anxiety disorder: Secondary | ICD-10-CM | POA: Diagnosis not present

## 2021-09-15 DIAGNOSIS — F900 Attention-deficit hyperactivity disorder, predominantly inattentive type: Secondary | ICD-10-CM | POA: Diagnosis not present

## 2021-09-15 DIAGNOSIS — F4312 Post-traumatic stress disorder, chronic: Secondary | ICD-10-CM | POA: Diagnosis not present

## 2021-09-22 ENCOUNTER — Other Ambulatory Visit
Admission: RE | Admit: 2021-09-22 | Discharge: 2021-09-22 | Disposition: A | Discharge status: Home / Self Care | Payer: BC Managed Care – PPO | Source: Ambulatory Visit | Attending: Internal Medicine | Admitting: Internal Medicine

## 2021-09-22 DIAGNOSIS — Z01812 Encounter for preprocedural laboratory examination: Secondary | ICD-10-CM | POA: Insufficient documentation

## 2021-09-22 DIAGNOSIS — R072 Precordial pain: Secondary | ICD-10-CM | POA: Insufficient documentation

## 2021-09-22 LAB — BASIC METABOLIC PANEL
Anion gap: 8 (ref 5–15)
BUN: 9 mg/dL (ref 6–20)
CO2: 26 mmol/L (ref 22–32)
Calcium: 8.9 mg/dL (ref 8.9–10.3)
Chloride: 106 mmol/L (ref 98–111)
Creatinine, Ser: 0.89 mg/dL (ref 0.44–1.00)
GFR, Estimated: >60 mL/min (ref >60)
Glucose, Bld: 124 mg/dL — ABNORMAL HIGH (ref 70–99)
Potassium: 3.9 mmol/L (ref 3.5–5.1)
Sodium: 140 mmol/L (ref 135–145)

## 2021-09-23 ENCOUNTER — Telehealth (HOSPITAL_COMMUNITY): Payer: Self-pay | Admitting: *Deleted

## 2021-09-23 NOTE — Telephone Encounter (Signed)
Attempted to call patient regarding upcoming cardiac CT appointment. °Left message on voicemail with name and callback number ° °Charlean Carneal RN Navigator Cardiac Imaging °[facility] °[phone] Office °[phone] Cell ° °

## 2021-09-26 ENCOUNTER — Ambulatory Visit
Admission: RE | Admit: 2021-09-26 | Discharge: 2021-09-26 | Disposition: A | Discharge status: Home / Self Care | Payer: BC Managed Care – PPO | Source: Ambulatory Visit | Attending: Internal Medicine | Admitting: Internal Medicine

## 2021-09-26 DIAGNOSIS — R072 Precordial pain: Secondary | ICD-10-CM | POA: Diagnosis not present

## 2021-09-26 MED ORDER — NITROGLYCERIN 0.4 MG SL SUBL
0.8000 mg | SUBLINGUAL_TABLET | Freq: Once | SUBLINGUAL | Status: AC
  Administered 2021-09-26: 0.8 mg via SUBLINGUAL

## 2021-09-26 MED ORDER — IOHEXOL 350 MG/ML SOLN
100.0000 mL | Freq: Once | INTRAVENOUS | Status: AC | PRN
  Administered 2021-09-26: 100 mL via INTRAVENOUS

## 2021-09-26 MED ORDER — METOPROLOL TARTRATE 5 MG/5ML IV SOLN
10.0000 mg | Freq: Once | INTRAVENOUS | Status: DC
Start: 2021-09-26 — End: 2021-09-27

## 2021-09-26 MED ORDER — METOPROLOL TARTRATE 5 MG/5ML IV SOLN
10.0000 mg | Freq: Once | INTRAVENOUS | Status: AC
  Administered 2021-09-26: 10 mg via INTRAVENOUS

## 2021-09-26 NOTE — Progress Notes (Signed)
Patient tolerated procedure well. Ambulate w/o difficulty. Denies any lightheadedness or being dizzy. Pt denies any pain at this time. Sitting in chair, pt is encouraged to drink additional water throughout the day and reason explained to patient. Patient verbalized understanding and all questions answered. ABC intact. No further needs at this time. Discharge from procedure area w/o issues.  

## 2021-09-29 ENCOUNTER — Other Ambulatory Visit: Payer: BC Managed Care – PPO

## 2021-10-08 ENCOUNTER — Other Ambulatory Visit: Payer: Self-pay | Admitting: Internal Medicine

## 2021-10-17 DIAGNOSIS — J069 Acute upper respiratory infection, unspecified: Secondary | ICD-10-CM | POA: Diagnosis not present

## 2021-10-20 DIAGNOSIS — F900 Attention-deficit hyperactivity disorder, predominantly inattentive type: Secondary | ICD-10-CM | POA: Diagnosis not present

## 2021-10-20 DIAGNOSIS — F3189 Other bipolar disorder: Secondary | ICD-10-CM | POA: Diagnosis not present

## 2021-10-20 DIAGNOSIS — F411 Generalized anxiety disorder: Secondary | ICD-10-CM | POA: Diagnosis not present

## 2021-10-20 DIAGNOSIS — F4312 Post-traumatic stress disorder, chronic: Secondary | ICD-10-CM | POA: Diagnosis not present

## 2021-11-11 ENCOUNTER — Ambulatory Visit: Payer: BC Managed Care – PPO | Admitting: Family Medicine

## 2021-11-11 ENCOUNTER — Encounter: Payer: Self-pay | Admitting: Family Medicine

## 2021-11-11 VITALS — BP 103/69 | HR 70 | Temp 97.9°F | Wt 256.2 lb

## 2021-11-11 DIAGNOSIS — Z833 Family history of diabetes mellitus: Secondary | ICD-10-CM | POA: Diagnosis not present

## 2021-11-11 DIAGNOSIS — F411 Generalized anxiety disorder: Secondary | ICD-10-CM

## 2021-11-11 DIAGNOSIS — E559 Vitamin D deficiency, unspecified: Secondary | ICD-10-CM

## 2021-11-11 DIAGNOSIS — I10 Essential (primary) hypertension: Secondary | ICD-10-CM | POA: Diagnosis not present

## 2021-11-11 DIAGNOSIS — F3132 Bipolar disorder, current episode depressed, moderate: Secondary | ICD-10-CM

## 2021-11-11 DIAGNOSIS — R9431 Abnormal electrocardiogram [ECG] [EKG]: Secondary | ICD-10-CM

## 2021-11-11 DIAGNOSIS — E785 Hyperlipidemia, unspecified: Secondary | ICD-10-CM | POA: Diagnosis not present

## 2021-11-11 DIAGNOSIS — D519 Vitamin B12 deficiency anemia, unspecified: Secondary | ICD-10-CM | POA: Diagnosis not present

## 2021-11-11 DIAGNOSIS — R5383 Other fatigue: Secondary | ICD-10-CM | POA: Diagnosis not present

## 2021-11-11 DIAGNOSIS — Z1159 Encounter for screening for other viral diseases: Secondary | ICD-10-CM

## 2021-11-11 DIAGNOSIS — H538 Other visual disturbances: Secondary | ICD-10-CM

## 2021-11-11 DIAGNOSIS — F332 Major depressive disorder, recurrent severe without psychotic features: Secondary | ICD-10-CM

## 2021-11-11 LAB — URINALYSIS, ROUTINE W REFLEX MICROSCOPIC
Bilirubin, UA: NEGATIVE
Glucose, UA: NEGATIVE
Ketones, UA: NEGATIVE
Leukocytes,UA: NEGATIVE
Nitrite, UA: NEGATIVE
RBC, UA: NEGATIVE
Specific Gravity, UA: >1.03 — ABNORMAL HIGH (ref 1.005–1.030)
Urobilinogen, Ur: 1 mg/dL (ref 0.2–1.0)
pH, UA: 6 (ref 5.0–7.5)

## 2021-11-11 LAB — MICROALBUMIN, URINE WAIVED
Creatinine, Urine Waived: 300 mg/dL (ref 10–300)
Microalb, Ur Waived: 30 mg/L — ABNORMAL HIGH (ref 0–19)
Microalb/Creat Ratio: <30 mg/g (ref <30)

## 2021-11-11 LAB — BAYER DCA HB A1C WAIVED: HB A1C (BAYER DCA - WAIVED): 5.5 % (ref 4.8–5.6)

## 2021-11-11 MED ORDER — QUETIAPINE FUMARATE ER 50 MG PO TB24: 50.0000 mg | ORAL_TABLET | Freq: Every day | ORAL | 1 refills | Status: DC

## 2021-11-11 NOTE — Assessment & Plan Note (Signed)
Under fair control on current regimen. Does not want to see psychiatry anymore. Will change her seroquel to 50mg XR and recheck 1 month. Call with any concerns.  

## 2021-11-11 NOTE — Assessment & Plan Note (Signed)
Follows with cardiology. Continue current regimen. Continue to monitor. Call with any concerns.

## 2021-11-11 NOTE — Assessment & Plan Note (Signed)
Under good control on current regimen. Continue current regimen. Continue to monitor. Call with any concerns. Refills through cardiology. Continue to follow with cardiology. Labs drawn today.  ? ?

## 2021-11-11 NOTE — Assessment & Plan Note (Signed)
Rechecking labs today. Await results. Treat as needed.  °

## 2021-11-11 NOTE — Progress Notes (Signed)
BP 103/69   Pulse 70   Temp 97.9 F (36.6 C)   Wt 256 lb 3.2 oz (116.2 kg)   LMP 08/13/2017 (Exact Date)   SpO2 97%   BMI 50.04 kg/m    Subjective:    Patient ID: Holly Nicholson, female    DOB: 12/24/1985, 36 y.o.   MRN: [id number]  HPI: Holly Nicholson is a 36 y.o. female  Chief Complaint  Patient presents with   Establish Care    Patient last seen in 2021, here to re-establish care. Patient currently see's cardiology, says she had a mini heart attack earlier this year.    Family history of diabetes    Patient states she has a family history of diabetes and would like to screened.    Fatigue    Patient states she is concerned her thyroid is off due to her feeling fatigue and her weight goes up and down.    Had QT prolongation. Saw cardiology for it. They did a CTA. Normal with no sign of CAD. Thought to be due to neurologic issue related to her migraines.   HYPERTENSION  Hypertension status: controlled  Satisfied with current treatment? yes Duration of hypertension: chronic BP monitoring frequency:  not checking BP medication side effects:  no Medication compliance: excellent compliance Previous BP meds:nadolol Aspirin: yes Recurrent headaches: no Visual changes: yes- blurry at random times Palpitations: no Dyspnea: no Chest pain: no Lower extremity edema: no Dizzy/lightheaded: no  BIPOLAR- was seeing psychiatry through her job. She has been taking 50mg  at night and 25mg  during the day Mood status: uncontrolled Satisfied with current treatment?: yes Symptom severity: moderate  Duration of current treatment : chronic Side effects: no Medication compliance: good compliance Psychotherapy/counseling: no  Previous psychiatric medications: seroquel, "several medications", zoloft, lamictal Depressed mood: yes Anxious mood: yes Anhedonia: no Significant weight loss or gain: no Insomnia: no  Fatigue: yes Feelings of worthlessness or guilt: yes Impaired  concentration/indecisiveness: yes Suicidal ideations: no Hopelessness: no Crying spells: yes    11/11/2021    3:50 PM 02/10/2019    4:55 PM 07/10/2017   10:05 AM 04/25/2017    9:56 AM 12/22/2016   11:38 AM  Depression screen PHQ 2/9  Decreased Interest 1 1 2  0 1  Down, Depressed, Hopeless 1 2 2 1 1   PHQ - 2 Score 2 3 4 1 2   Altered sleeping 0 3 3 2 2   Tired, decreased energy 1 2 3 2 1   Change in appetite 3 3 3 3 2   Feeling bad or failure about yourself  1 2 3 1 2   Trouble concentrating 3 3 3 1  0  Moving slowly or fidgety/restless 3 2 3 2  0  Suicidal thoughts 0 0 0 0 0  PHQ-9 Score 13 18 22 12 9   Difficult doing work/chores     Somewhat difficult      11/11/2021    3:50 PM 07/10/2017   10:06 AM 04/25/2017    9:56 AM 12/22/2016   11:37 AM  GAD 7 : Generalized Anxiety Score  Nervous, Anxious, on Edge 1 3 1 3   Control/stop worrying 3 2 2 3   Worry too much - different things 3 2 1 2   Trouble relaxing 2 3 2 2   Restless 3 3 2 3   Easily annoyed or irritable 1 3 2 3   Afraid - awful might happen 2 2 0 0  Total GAD 7 Score 15 18 10 16   Anxiety Difficulty  Somewhat difficult  Somewhat difficult     Active Ambulatory Problems    Diagnosis Date Noted   Generalized anxiety disorder 08/20/2014   Bipolar affective disorder, currently depressed, moderate (HCC) 08/20/2014   Tobacco abuse 08/20/2014   Vitamin D deficiency 09/04/2014   Morbid obesity (HCC) 09/06/2014   BMI 45.0-49.9, adult (HCC) 04/27/2016   S/P laparoscopic hysterectomy 10/30/2017   MDD (major depressive disorder) 05/04/2018   Essential hypertension 02/16/2019   TIA (transient ischemic attack) 02/04/2021   QT prolongation 02/05/2021   Migraine    Resolved Ambulatory Problems    Diagnosis Date Noted   Emesis 08/20/2014   Ectopic pregnancy 08/20/2014   Other fatigue 08/20/2014   Absolute anemia 08/20/2014   Cephalalgia 08/20/2014   Preventative health care 08/20/2014   Transient elevated blood pressure 08/20/2014    Encounter for sterilization 08/20/2014   Foot pain, right 09/04/2014   Swelling of both ankles 07/21/2015   Cholelithiasis without cholecystitis    Biliary colic 09/01/2015   Pregnancy 04/10/2013   Pregnancy of unknown anatomic location 08/17/2014   Encounter for supervision of low-risk first pregnancy, antepartum 03/29/2016   Indication for care in labor and delivery, antepartum 04/13/2016   Influenza B 04/13/2016   Obesity affecting pregnancy in third trimester 05/10/2016   Supervision of high risk pregnancy, antepartum 06/01/2016   Indication for care in labor or delivery 06/08/2016   Gestational hypertension without significant proteinuria in third trimester 06/08/2016   Postpartum care following vaginal delivery 06/10/2016   Female sterility 11/01/2016   Depression 12/25/2016   Left-sided weakness    Past Medical History:  Diagnosis Date   Anxiety    Elevated blood pressure    GERD (gastroesophageal reflux disease)    History of bilateral tubal ligation 2007   History of bilateral tubal ligation 2007   Hypertension    Low-lying placenta    Lower extremity edema    Tubal pregnancy Aug 2016   Past Surgical History:  Procedure Laterality Date   ABDOMINAL HYSTERECTOMY     CHOLECYSTECTOMY N/A 09/02/2015   Procedure: LAPAROSCOPIC CHOLECYSTECTOMY;  Surgeon: Leafy Ro, MD;  Location: [facility];  Service: General;  Laterality: N/A;   CHOLECYSTECTOMY     KNEE SURGERY     LAPAROSCOPIC HYSTERECTOMY Bilateral 10/30/2017   Procedure: HYSTERECTOMY TOTAL LAPAROSCOPIC-BILATERAL SALPINGECTOMY;  Surgeon: Vena Austria, MD;  Location: [facility];  Service: Gynecology;  Laterality: Bilateral;   LAPAROSCOPIC SALPINGO OOPHERECTOMY N/A 10/19/2016   Procedure: LAPAROSCOPIC SALPINGECTOMY bilateral;  Surgeon: Nadara Mustard, MD;  Location: [facility];  Service: Gynecology;  Laterality: N/A;   TUBAL LIGATION     TUBAL LIGATION Bilateral    Tubal reanastamosis     Outpatient Encounter  Medications as of 11/11/2021  Medication Sig Note   aspirin EC 81 MG EC tablet Take 1 tablet (81 mg total) by mouth daily. Swallow whole.    nadolol (CORGARD) 40 MG tablet Take 1 tablet (40 mg total) by mouth daily.    nitroGLYCERIN (NITROSTAT) 0.4 MG SL tablet Place 1 tablet (0.4 mg total) under the tongue every 5 (five) minutes as needed for chest pain.    pantoprazole (PROTONIX) 40 MG tablet Take 1 tablet (40 mg total) by mouth daily.    QUEtiapine (SEROQUEL XR) 50 MG TB24 24 hr tablet Take 1 tablet (50 mg total) by mouth at bedtime.    QUEtiapine (SEROQUEL) 25 MG tablet Take 50 mg by mouth at bedtime. 11/11/2021: 25mg  daily    QUEtiapine (SEROQUEL) 50 MG tablet Take 50 mg  by mouth at bedtime.    [DISCONTINUED] atorvastatin (LIPITOR) 10 MG tablet TAKE 1 TABLET BY MOUTH EVERY DAY (Patient not taking: Reported on 11/11/2021)    [DISCONTINUED] meclizine (ANTIVERT) 25 MG tablet Take 25 mg by mouth 3 (three) times daily. (Patient not taking: Reported on 09/08/2021)    Facility-Administered Encounter Medications as of 11/11/2021  Medication   loratadine (CLARITIN) tablet 10 mg   Allergies  Allergen Reactions   Onion Anaphylaxis, Swelling and Hives    Raw onions causes throat to swell   Latex Dermatitis   Social History   Socioeconomic History   Marital status: Married    Spouse name: Not on file   Number of children: 4   Years of education: Not on file   Highest education level: Some college, no degree  Occupational History   Not on file  Tobacco Use   Smoking status: Every Day    Packs/day: 1.00    Types: Cigarettes   Smokeless tobacco: Never  Vaping Use   Vaping Use: Never used  Substance and Sexual Activity   Alcohol use: Not Currently   Drug use: Not Currently    Types: Marijuana   Sexual activity: Yes    Birth control/protection: Surgical  Other Topics Concern   Not on file  Social History Narrative   ** Merged History Encounter **       Social Determinants of Health    Financial Resource Strain: Medium Risk (05/17/2017)   Overall Financial Resource Strain (CARDIA)    Difficulty of Paying Living Expenses: Somewhat hard  Food Insecurity: Food Insecurity Present (05/05/2018)   Hunger Vital Sign    Worried About Running Out of Food in the Last Year: Sometimes true    Ran Out of Food in the Last Year: Sometimes true  Transportation Needs: No Transportation Needs (05/17/2017)   PRAPARE - Hydrologist (Medical): No    Lack of Transportation (Non-Medical): No  Physical Activity: Inactive (05/17/2017)   Exercise Vital Sign    Days of Exercise per Week: 0 days    Minutes of Exercise per Session: 0 min  Stress: Stress Concern Present (05/17/2017)   [place]    Feeling of Stress : Very much  Social Connections: Somewhat Isolated (05/17/2017)   Social Connection and Isolation Panel [NHANES]    Frequency of Communication with Friends and Family: More than three times a week    Frequency of Social Gatherings with Friends and Family: Never    Attends Religious Services: More than 4 times per year    Active Member of [employer] or Organizations: No    Attends Music therapist: Never    Marital Status: Divorced   Family History  Problem Relation Age of Onset   Heart disease Mother    Hyperlipidemia Mother    Hypertension Mother    Depression Mother    Anxiety disorder Mother    Diabetes Father    Heart disease Father    Hyperlipidemia Father    Hypertension Father    Stroke Father    Cancer Maternal Grandfather        lung   Cancer Paternal Grandmother        stomach   Epilepsy Daughter    Asthma Son    Stroke Paternal Grandfather    Depression Maternal Aunt    Anxiety disorder Maternal Aunt      Review of Systems  Constitutional:  Positive  for fatigue. Negative for activity change, appetite change, chills, diaphoresis, fever and unexpected weight  change.  Respiratory:  Positive for cough. Negative for apnea, choking, chest tightness, shortness of breath, wheezing and stridor.   Cardiovascular:  Positive for chest pain and leg swelling. Negative for palpitations.  Gastrointestinal: Negative.   Genitourinary: Negative.   Musculoskeletal: Negative.   Neurological: Negative.   Psychiatric/Behavioral:  Positive for dysphoric mood. Negative for agitation, behavioral problems, confusion, decreased concentration, hallucinations, self-injury, sleep disturbance and suicidal ideas. The patient is nervous/anxious. The patient is not hyperactive.     Per HPI unless specifically indicated above     Objective:    BP 103/69   Pulse 70   Temp 97.9 F (36.6 C)   Wt 256 lb 3.2 oz (116.2 kg)   LMP 08/13/2017 (Exact Date)   SpO2 97%   BMI 50.04 kg/m   Wt Readings from Last 3 Encounters:  11/11/21 256 lb 3.2 oz (116.2 kg)  09/08/21 261 lb 2 oz (118.4 kg)  08/01/21 250 lb (113.4 kg)    Physical Exam Vitals and nursing note reviewed.  Constitutional:      General: She is not in acute distress.    Appearance: Normal appearance. She is obese. She is not ill-appearing, toxic-appearing or diaphoretic.  HENT:     Head: Normocephalic and atraumatic.     Right Ear: External ear normal.     Left Ear: External ear normal.     Nose: Nose normal.     Mouth/Throat:     Mouth: Mucous membranes are moist.     Pharynx: Oropharynx is clear.  Eyes:     General: No scleral icterus.       Right eye: No discharge.        Left eye: No discharge.     Extraocular Movements: Extraocular movements intact.     Conjunctiva/sclera: Conjunctivae normal.     Pupils: Pupils are equal, round, and reactive to light.  Cardiovascular:     Rate and Rhythm: Normal rate and regular rhythm.     Pulses: Normal pulses.     Heart sounds: Normal heart sounds. No murmur heard.    No friction rub. No gallop.  Pulmonary:     Effort: Pulmonary effort is normal. No  respiratory distress.     Breath sounds: Normal breath sounds. No stridor. No wheezing, rhonchi or rales.  Chest:     Chest wall: No tenderness.  Musculoskeletal:        General: Normal range of motion.     Cervical back: Normal range of motion and neck supple.  Skin:    General: Skin is warm and dry.     Capillary Refill: Capillary refill takes less than 2 seconds.     Coloration: Skin is not jaundiced or pale.     Findings: No bruising, erythema, lesion or rash.  Neurological:     General: No focal deficit present.     Mental Status: She is alert and oriented to person, place, and time. Mental status is at baseline.  Psychiatric:        Mood and Affect: Mood normal.        Behavior: Behavior normal.        Thought Content: Thought content normal.        Judgment: Judgment normal.     Results for orders placed or performed in visit on 11/11/21  Urinalysis, Routine w reflex microscopic  Result Value Ref Range   Specific Gravity, UA >  1.030 (H) 1.005 - 1.030   pH, UA 6.0 5.0 - 7.5   Color, UA Yellow Yellow   Appearance Ur Clear Clear   Leukocytes,UA Negative Negative   Protein,UA Trace (A) Negative/Trace   Glucose, UA Negative Negative   Ketones, UA Negative Negative   RBC, UA Negative Negative   Bilirubin, UA Negative Negative   Urobilinogen, Ur 1.0 0.2 - 1.0 mg/dL   Nitrite, UA Negative Negative   Microscopic Examination Comment   Bayer DCA Hb A1c Waived  Result Value Ref Range   HB A1C (BAYER DCA - WAIVED) 5.5 4.8 - 5.6 %  Microalbumin, Urine Waived  Result Value Ref Range   Microalb, Ur Waived 30 (H) 0 - 19 mg/L   Creatinine, Urine Waived 300 10 - 300 mg/dL   Microalb/Creat Ratio <30 <30 mg/g      Assessment & Plan:   Problem List Items Addressed This Visit       Cardiovascular and Mediastinum   Essential hypertension    Under good control on current regimen. Continue current regimen. Continue to monitor. Call with any concerns. Refills through cardiology.  Continue to follow with cardiology. Labs drawn today.       Relevant Orders   CBC with Differential/Platelet   Comprehensive metabolic panel   Urinalysis, Routine w reflex microscopic (Completed)   Microalbumin, Urine Waived (Completed)     Other   Generalized anxiety disorder    Under fair control on current regimen. Does not want to see psychiatry anymore. Will change her seroquel to  XR and recheck 1 month. Call with any concerns.       Bipolar affective disorder, currently depressed, moderate (HCC)    Under fair control on current regimen. Does not want to see psychiatry anymore. Will change her seroquel to  XR and recheck 1 month. Call with any concerns.        Relevant Orders   CBC with Differential/Platelet   Comprehensive metabolic panel   Vitamin D deficiency    Rechecking labs today. Await results. Treat as needed.       Relevant Orders   CBC with Differential/Platelet   Comprehensive metabolic panel   VITAMIN D 25 Hydroxy (Vit-D Deficiency, Fractures)   MDD (major depressive disorder)    Under fair control on current regimen. Does not want to see psychiatry anymore. Will change her seroquel to  XR and recheck 1 month. Call with any concerns.       QT prolongation    Follows with cardiology. Continue current regimen. Continue to monitor. Call with any concerns.      Other Visit Diagnoses     Fatigue, unspecified type    -  Primary   Likely muti-factorial. Will check labs and try to get her mood under better control. Continue to monitor. Call with any concerns.   Relevant Orders   CBC with Differential/Platelet   Comprehensive metabolic panel   TSH   B12   Family history of diabetes mellitus       Doing well with A1c of 5.5. Continue to monitor.   Relevant Orders   CBC with Differential/Platelet   Comprehensive metabolic panel   Bayer DCA Hb Z6X Waived (Completed)   Hyperlipidemia, unspecified hyperlipidemia type       Rechecking labs  today. Await results.   Relevant Orders   Lipid Panel w/o Chol/HDL Ratio   Need for hepatitis C screening test       Rechecking labs today. Await results.  Relevant Orders   Hepatitis C Antibody   Blurred vision       Referral to opthalmology placed today.   Relevant Orders   Ambulatory referral to Ophthalmology        Follow up plan: Return in about 4 weeks (around 12/09/2021).

## 2021-11-11 NOTE — Assessment & Plan Note (Signed)
Under fair control on current regimen. Does not want to see psychiatry anymore. Will change her seroquel to 50mg  XR and recheck 1 month. Call with any concerns.

## 2021-11-12 LAB — COMPREHENSIVE METABOLIC PANEL
ALT: 38 IU/L — ABNORMAL HIGH (ref 0–32)
AST: 22 IU/L (ref 0–40)
Albumin/Globulin Ratio: 2 (ref 1.2–2.2)
Albumin: 4.6 g/dL (ref 3.9–4.9)
Alkaline Phosphatase: 115 IU/L (ref 44–121)
BUN/Creatinine Ratio: 8 — ABNORMAL LOW (ref 9–23)
BUN: 7 mg/dL (ref 6–20)
Bilirubin Total: 1.1 mg/dL (ref 0.0–1.2)
CO2: 25 mmol/L (ref 20–29)
Calcium: 9.4 mg/dL (ref 8.7–10.2)
Chloride: 102 mmol/L (ref 96–106)
Creatinine, Ser: 0.83 mg/dL (ref 0.57–1.00)
Globulin, Total: 2.3 g/dL (ref 1.5–4.5)
Glucose: 73 mg/dL (ref 70–99)
Potassium: 3.6 mmol/L (ref 3.5–5.2)
Sodium: 140 mmol/L (ref 134–144)
Total Protein: 6.9 g/dL (ref 6.0–8.5)
eGFR: 93 mL/min/{1.73_m2} (ref >59)

## 2021-11-12 LAB — LIPID PANEL W/O CHOL/HDL RATIO
Cholesterol, Total: 144 mg/dL (ref 100–199)
HDL: 34 mg/dL — ABNORMAL LOW (ref >39)
LDL Chol Calc (NIH): 69 mg/dL (ref 0–99)
Triglycerides: 249 mg/dL — ABNORMAL HIGH (ref 0–149)
VLDL Cholesterol Cal: 41 mg/dL — ABNORMAL HIGH (ref 5–40)

## 2021-11-12 LAB — CBC WITH DIFFERENTIAL/PLATELET
Basophils Absolute: 0 10*3/uL (ref 0.0–0.2)
Basos: 0 %
EOS (ABSOLUTE): 0.1 10*3/uL (ref 0.0–0.4)
Eos: 1 %
Hematocrit: 36.3 % (ref 34.0–46.6)
Hemoglobin: 12.4 g/dL (ref 11.1–15.9)
Immature Grans (Abs): 0 10*3/uL (ref 0.0–0.1)
Immature Granulocytes: 0 %
Lymphocytes Absolute: 1.2 10*3/uL (ref 0.7–3.1)
Lymphs: 16 %
MCH: 30.5 pg (ref 26.6–33.0)
MCHC: 34.2 g/dL (ref 31.5–35.7)
MCV: 89 fL (ref 79–97)
Monocytes Absolute: 0.5 10*3/uL (ref 0.1–0.9)
Monocytes: 7 %
Neutrophils Absolute: 5.8 10*3/uL (ref 1.4–7.0)
Neutrophils: 76 %
Platelets: 175 10*3/uL (ref 150–450)
RBC: 4.07 x10E6/uL (ref 3.77–5.28)
RDW: 13.2 % (ref 11.7–15.4)
WBC: 7.5 10*3/uL (ref 3.4–10.8)

## 2021-11-12 LAB — VITAMIN B12: Vitamin B-12: 203 pg/mL — ABNORMAL LOW (ref 232–1245)

## 2021-11-12 LAB — HEPATITIS C ANTIBODY: Hep C Virus Ab: NONREACTIVE

## 2021-11-12 LAB — TSH: TSH: 1.08 u[IU]/mL (ref 0.450–4.500)

## 2021-11-12 LAB — VITAMIN D 25 HYDROXY (VIT D DEFICIENCY, FRACTURES): Vit D, 25-Hydroxy: 21.6 ng/mL — ABNORMAL LOW (ref 30.0–100.0)

## 2021-11-14 ENCOUNTER — Other Ambulatory Visit: Payer: Self-pay | Admitting: Family Medicine

## 2021-11-14 DIAGNOSIS — E538 Deficiency of other specified B group vitamins: Secondary | ICD-10-CM

## 2021-11-14 MED ORDER — CYANOCOBALAMIN 1000 MCG/ML IJ SOLN
1000.0000 ug | INTRAMUSCULAR | Status: AC
  Administered 2021-11-25 – 2021-12-05 (×2): 1000 ug via INTRAMUSCULAR

## 2021-11-14 MED ORDER — VITAMIN D (ERGOCALCIFEROL) 1.25 MG (50000 UNIT) PO CAPS: 50000.0000 [IU] | ORAL_CAPSULE | ORAL | 1 refills | Status: DC

## 2021-11-14 MED ORDER — CYANOCOBALAMIN 1000 MCG/ML IJ SOLN
1000.0000 ug | INTRAMUSCULAR | Status: DC
  Administered 2021-12-16 – 2022-01-05 (×2): 1000 ug via INTRAMUSCULAR

## 2021-11-22 ENCOUNTER — Encounter: Payer: Self-pay | Admitting: Emergency Medicine

## 2021-11-22 ENCOUNTER — Emergency Department
Admission: EM | Admit: 2021-11-22 | Discharge: 2021-11-22 | Disposition: A | Discharge status: Home / Self Care | Payer: BC Managed Care – PPO | Attending: Emergency Medicine | Admitting: Emergency Medicine

## 2021-11-22 ENCOUNTER — Emergency Department: Payer: BC Managed Care – PPO

## 2021-11-22 ENCOUNTER — Other Ambulatory Visit: Payer: Self-pay

## 2021-11-22 DIAGNOSIS — J069 Acute upper respiratory infection, unspecified: Secondary | ICD-10-CM | POA: Diagnosis not present

## 2021-11-22 DIAGNOSIS — R0602 Shortness of breath: Secondary | ICD-10-CM | POA: Diagnosis not present

## 2021-11-22 LAB — COMPREHENSIVE METABOLIC PANEL
ALT: 25 U/L (ref 0–44)
AST: 20 U/L (ref 15–41)
Albumin: 3.9 g/dL (ref 3.5–5.0)
Alkaline Phosphatase: 83 U/L (ref 38–126)
Anion gap: 5 (ref 5–15)
BUN: 8 mg/dL (ref 6–20)
CO2: 27 mmol/L (ref 22–32)
Calcium: 9.1 mg/dL (ref 8.9–10.3)
Chloride: 108 mmol/L (ref 98–111)
Creatinine, Ser: 0.92 mg/dL (ref 0.44–1.00)
GFR, Estimated: >60 mL/min (ref >60)
Glucose, Bld: 103 mg/dL — ABNORMAL HIGH (ref 70–99)
Potassium: 4.2 mmol/L (ref 3.5–5.1)
Sodium: 140 mmol/L (ref 135–145)
Total Bilirubin: 0.9 mg/dL (ref 0.3–1.2)
Total Protein: 6.9 g/dL (ref 6.5–8.1)

## 2021-11-22 LAB — CBC
HCT: 35.9 % — ABNORMAL LOW (ref 36.0–46.0)
Hemoglobin: 12.6 g/dL (ref 12.0–15.0)
MCH: 31.3 pg (ref 26.0–34.0)
MCHC: 35.1 g/dL (ref 30.0–36.0)
MCV: 89.3 fL (ref 80.0–100.0)
Platelets: 168 10*3/uL (ref 150–400)
RBC: 4.02 MIL/uL (ref 3.87–5.11)
RDW: 13.2 % (ref 11.5–15.5)
WBC: 5.4 10*3/uL (ref 4.0–10.5)
nRBC: 0 % (ref 0.0–0.2)

## 2021-11-22 LAB — TROPONIN I (HIGH SENSITIVITY): Troponin I (High Sensitivity): 2 ng/L (ref <18)

## 2021-11-22 MED ORDER — IPRATROPIUM-ALBUTEROL 0.5-2.5 (3) MG/3ML IN SOLN
3.0000 mL | Freq: Once | RESPIRATORY_TRACT | Status: AC
  Administered 2021-11-22: 3 mL via RESPIRATORY_TRACT
  Filled 2021-11-22: qty 3

## 2021-11-22 NOTE — ED Provider Notes (Signed)
[facility] Provider Note    Event Date/Time   First MD Initiated Contact with Patient 11/22/21 1041     (approximate)   History   Shortness of Breath   HPI  Holly Nicholson is a 36 y.o. female with a history of depression, hypertension who presents with complaints of cough, chest pressure.  Patient reports that she has had a cough for several days.  This morning she woke up with mild chest pressure and became concerned.  She reports she felt somewhat short of breath this morning, better now.  Productive cough.  No sick contacts reported.  No lower extremity swelling or pain.  No pleurisy     Physical Exam   Triage Vital Signs: ED Triage Vitals  Enc Vitals Group     BP 11/22/21 1023 124/82     Pulse Rate 11/22/21 1023 68     Resp 11/22/21 1023 20     Temp 11/22/21 1023 98.3 F (36.8 C)     Temp Source 11/22/21 1023 Oral     SpO2 11/22/21 1023 96 %     Weight 11/22/21 1024 117.9 kg (260 lb)     Height 11/22/21 1024 1.524 m (5')     Head Circumference --      Peak Flow --      Pain Score 11/22/21 1023 7     Pain Loc --      Pain Edu? --      Excl. in GC? --     Most recent vital signs: Vitals:   11/22/21 1125 11/22/21 1147  BP:  120/80  Pulse:  70  Resp:  20  Temp:    SpO2: 99% 96%     General: Awake, no distress.  CV:  Good peripheral perfusion.  Resp:  Normal effort.  Scattered mild wheezing Abd:  No distention.  Other:     ED Results / Procedures / Treatments   Labs (all labs ordered are listed, but only abnormal results are displayed) Labs Reviewed  CBC - Abnormal; Notable for the following components:      Result Value   HCT 35.9 (*)    All other components within normal limits  COMPREHENSIVE METABOLIC PANEL - Abnormal; Notable for the following components:   Glucose, Bld 103 (*)    All other components within normal limits  TROPONIN I (HIGH SENSITIVITY)     EKG  ED ECG REPORT I, Jene Every, the attending  physician, personally viewed and interpreted this ECG.  Date: 11/22/2021  Rhythm: normal sinus rhythm QRS Axis: normal Intervals: normal ST/T Wave abnormalities: normal Narrative Interpretation: no evidence of acute ischemia    RADIOLOGY Chest x-ray viewed interpreted by me, no acute abnormality    PROCEDURES:  Critical Care performed:   Procedures   MEDICATIONS ORDERED IN ED: Medications  ipratropium-albuterol (DUONEB) 0.5-2.5 (3) MG/3ML nebulizer solution 3 mL (3 mLs Nebulization Given 11/22/21 1121)  ipratropium-albuterol (DUONEB) 0.5-2.5 (3) MG/3ML nebulizer solution 3 mL (3 mLs Nebulization Given 11/22/21 1121)     IMPRESSION / MDM / ASSESSMENT AND PLAN / ED COURSE  I reviewed the triage vital signs and the nursing notes. Patient's presentation is most consistent with acute illness / injury with system symptoms.  Patient presents with cough, chest discomfort as noted above.  She is well-appearing and in no acute distress.  Differential includes viral upper respiratory effort, bronchospasm, bronchitis, pneumonia, less likely ACS given reassuring EKG,  Chest x-ray is without evidence of pneumonia.  Patient treated with DuoNebs with improvement.  Recommend supportive care, outpatient follow-up as needed no indication for admission at this time        FINAL CLINICAL IMPRESSION(S) / ED DIAGNOSES   Final diagnoses:  Viral upper respiratory tract infection     Rx / DC Orders   ED Discharge Orders     None        Note:  This document was prepared using Dragon voice recognition software and may include unintentional dictation errors.   Jene Every, MD 11/22/21 [phone]

## 2021-11-22 NOTE — ED Notes (Signed)
This RN to bedside with breathing tx. Pt currently in restroom.

## 2021-11-22 NOTE — ED Notes (Addendum)
No obvious wheezing upon auscultation. Lung sounds clear. Pt with slight inc WOB when walking back from restroom. Pt denies history of asthma or COPD.

## 2021-11-22 NOTE — ED Triage Notes (Signed)
Pt with SOB and chest pressure since waking up this am. Pt had MI in January.

## 2021-11-25 ENCOUNTER — Ambulatory Visit (INDEPENDENT_AMBULATORY_CARE_PROVIDER_SITE_OTHER): Payer: BC Managed Care – PPO

## 2021-11-25 DIAGNOSIS — E538 Deficiency of other specified B group vitamins: Secondary | ICD-10-CM | POA: Diagnosis not present

## 2021-12-04 ENCOUNTER — Other Ambulatory Visit: Payer: Self-pay | Admitting: Family Medicine

## 2021-12-05 ENCOUNTER — Ambulatory Visit (INDEPENDENT_AMBULATORY_CARE_PROVIDER_SITE_OTHER): Payer: BC Managed Care – PPO

## 2021-12-05 DIAGNOSIS — E538 Deficiency of other specified B group vitamins: Secondary | ICD-10-CM | POA: Diagnosis not present

## 2021-12-06 NOTE — Telephone Encounter (Signed)
Requested medications are due for refill today.  no  Requested medications are on the active medications list.  yes  Last refill. 11/11/2021 #30 1 rf  Future visit scheduled.   yes  Notes to clinic.  Refill not delegated. Pharmacy is requesting 90 day supply.    Requested Prescriptions  Pending Prescriptions Disp Refills   QUEtiapine (SEROQUEL XR) 50 MG TB24 24 hr tablet [Pharmacy Med Name: QUETIAPINE ER 50 MG TABLET] 90 tablet 1    Sig: TAKE 1 TABLET BY MOUTH EVERYDAY AT BEDTIME     Not Delegated - Psychiatry:  Antipsychotics - Second Generation (Atypical) - quetiapine Failed - 12/04/2021 11:30 AM      Failed - This refill cannot be delegated      Failed - Lipid Panel in normal range within the last 12 months    Cholesterol, Total  Date Value Ref Range Status  11/11/2021 144 100 - 199 mg/dL Final   LDL Chol Calc (NIH)  Date Value Ref Range Status  11/11/2021 69 0 - 99 mg/dL Final   HDL  Date Value Ref Range Status  11/11/2021 34 (L) >39 mg/dL Final   Triglycerides  Date Value Ref Range Status  11/11/2021 249 (H) 0 - 149 mg/dL Final         Failed - CMP within normal limits and completed in the last 12 months    Albumin  Date Value Ref Range Status  11/22/2021 3.9 3.5 - 5.0 g/dL Final  11/11/2021 4.6 3.9 - 4.9 g/dL Final  03/08/2013 3.7 3.4 - 5.0 g/dL Final   Alkaline Phosphatase  Date Value Ref Range Status  11/22/2021 83 38 - 126 U/L Final  03/08/2013 90 Unit/L Final    Comment:    45-117 NOTE: New Reference Range 11/29/12    ALT  Date Value Ref Range Status  11/22/2021 25 0 - 44 U/L Final   SGPT (ALT)  Date Value Ref Range Status  03/08/2013 29 12 - 78 U/L Final   AST  Date Value Ref Range Status  11/22/2021 20 15 - 41 U/L Final   SGOT(AST)  Date Value Ref Range Status  03/08/2013 23 15 - 37 Unit/L Final   BUN  Date Value Ref Range Status  11/22/2021 8 6 - 20 mg/dL Final  11/11/2021 7 6 - 20 mg/dL Final  03/08/2013 4 (L) 7 - 18 mg/dL  Final   Calcium  Date Value Ref Range Status  11/22/2021 9.1 8.9 - 10.3 mg/dL Final   Calcium, Total  Date Value Ref Range Status  03/08/2013 8.6 8.5 - 10.1 mg/dL Final   CO2  Date Value Ref Range Status  11/22/2021 27 22 - 32 mmol/L Final   Co2  Date Value Ref Range Status  03/08/2013 25 21 - 32 mmol/L Final   Creatinine  Date Value Ref Range Status  03/08/2013 0.70 0.60 - 1.30 mg/dL Final   Creatinine, Ser  Date Value Ref Range Status  11/22/2021 0.92 0.44 - 1.00 mg/dL Final   Creatinine, Urine  Date Value Ref Range Status  06/08/2016 250 mg/dL Final   Glucose  Date Value Ref Range Status  03/08/2013 89 65 - 99 mg/dL Final   Glucose, Bld  Date Value Ref Range Status  11/22/2021 103 (H) 70 - 99 mg/dL Final    Comment:    Glucose reference range applies only to samples taken after fasting for at least 8 hours.   Glucose-Capillary  Date Value Ref Range Status  02/04/2021 96  70 - 99 mg/dL Final    Comment:    Glucose reference range applies only to samples taken after fasting for at least 8 hours.   Potassium  Date Value Ref Range Status  11/22/2021 4.2 3.5 - 5.1 mmol/L Final  03/08/2013 3.4 (L) 3.5 - 5.1 mmol/L Final   Sodium  Date Value Ref Range Status  11/22/2021 140 135 - 145 mmol/L Final  11/11/2021 140 134 - 144 mmol/L Final  03/08/2013 136 136 - 145 mmol/L Final   Total Bilirubin  Date Value Ref Range Status  11/22/2021 0.9 0.3 - 1.2 mg/dL Final   Bilirubin,Total  Date Value Ref Range Status  03/08/2013 0.5 0.2 - 1.0 mg/dL Final   Bilirubin Total  Date Value Ref Range Status  11/11/2021 1.1 0.0 - 1.2 mg/dL Final   Protein, ur  Date Value Ref Range Status  12/24/2018 NEGATIVE NEGATIVE mg/dL Final   Protein,UA  Date Value Ref Range Status  11/11/2021 Trace (A) Negative/Trace Final   Total Protein, Urine  Date Value Ref Range Status  06/08/2016 25 mg/dL Final    Comment:    NO NORMAL RANGE ESTABLISHED FOR THIS TEST   Total Protein   Date Value Ref Range Status  11/22/2021 6.9 6.5 - 8.1 g/dL Final  11/11/2021 6.9 6.0 - 8.5 g/dL Final  03/08/2013 7.5 6.4 - 8.2 g/dL Final   EGFR (African American)  Date Value Ref Range Status  03/08/2013 >60  Final   GFR calc Af Amer  Date Value Ref Range Status  02/10/2019 110 >59 mL/min/1.73 Final   eGFR  Date Value Ref Range Status  11/11/2021 93 >59 mL/min/1.73 Final   EGFR (Non-African Amer.)  Date Value Ref Range Status  03/08/2013 >60  Final    Comment:    eGFR values <74m/min/1.73 m2 may be an indication of chronic kidney disease (CKD). Calculated eGFR is useful in patients with stable renal function. The eGFR calculation will not be reliable in acutely ill patients when serum creatinine is changing rapidly. It is not useful in  patients on dialysis. The eGFR calculation may not be applicable to patients at the low and high extremes of body sizes, pregnant women, and vegetarians.    GFR, Estimated  Date Value Ref Range Status  11/22/2021 >60 >60 mL/min Final    Comment:    (NOTE) Calculated using the CKD-EPI Creatinine Equation (2021)          Passed - TSH in normal range and within 360 days    TSH  Date Value Ref Range Status  11/11/2021 1.080 0.450 - 4.500 uIU/mL Final         Passed - Completed PHQ-2 or PHQ-9 in the last 360 days      Passed - Last BP in normal range    BP Readings from Last 1 Encounters:  11/22/21 120/80         Passed - Last Heart Rate in normal range    Pulse Readings from Last 1 Encounters:  11/22/21 70         Passed - Valid encounter within last 6 months    Recent Outpatient Visits           3 weeks ago Fatigue, unspecified type   [place] [facility] [place]   2 [facility]  [facility]   [place] [facility]  2  years ago Essential hypertension   [place], [place]   3 years ago Generalized anxiety disorder   [facility] [place], Jeannette How, MD       Future Appointments             In 3 days Corkins, Barb Merino, DO [facility]            Passed - CBC within normal limits and completed in the last 12 months    WBC  Date Value Ref Range Status  11/22/2021 5.4 4.0 - 10.5 K/uL Final   RBC  Date Value Ref Range Status  11/22/2021 4.02 3.87 - 5.11 MIL/uL Final   Hemoglobin  Date Value Ref Range Status  11/22/2021 12.6 12.0 - 15.0 g/dL Final  11/11/2021 12.4 11.1 - 15.9 g/dL Final  10/21/2015 11.7 g/dL Final   HCT  Date Value Ref Range Status  11/22/2021 35.9 (L) 36.0 - 46.0 % Final  10/21/2015 33 % Final   Hematocrit  Date Value Ref Range Status  11/11/2021 36.3 34.0 - 46.6 % Final   MCHC  Date Value Ref Range Status  11/22/2021 35.1 30.0 - 36.0 g/dL Final   [facility]  Date Value Ref Range Status  11/22/2021 31.3 26.0 - 34.0 pg Final   MCV  Date Value Ref Range Status  11/22/2021 89.3 80.0 - 100.0 fL Final  11/11/2021 89 79 - 97 fL Final  09/29/2013 87 80 - 100 fL Final   No results found for: "PLTCOUNTKUC", "LABPLAT", "POCPLA" RDW  Date Value Ref Range Status  11/22/2021 13.2 11.5 - 15.5 % Final  11/11/2021 13.2 11.7 - 15.4 % Final  09/29/2013 14.1 11.5 - 14.5 % Final

## 2021-12-09 ENCOUNTER — Ambulatory Visit: Payer: BC Managed Care – PPO | Admitting: Family Medicine

## 2021-12-09 ENCOUNTER — Encounter: Payer: Self-pay | Admitting: Family Medicine

## 2021-12-09 VITALS — BP 113/70 | HR 60 | Temp 98.1°F | Wt 259.0 lb

## 2021-12-09 DIAGNOSIS — Z419 Encounter for procedure for purposes other than remedying health state, unspecified: Secondary | ICD-10-CM | POA: Diagnosis not present

## 2021-12-09 DIAGNOSIS — F3132 Bipolar disorder, current episode depressed, moderate: Secondary | ICD-10-CM | POA: Diagnosis not present

## 2021-12-09 MED ORDER — ATORVASTATIN CALCIUM 10 MG PO TABS: 10.0000 mg | ORAL_TABLET | Freq: Every day | ORAL | 1 refills | Status: DC

## 2021-12-09 MED ORDER — QUETIAPINE FUMARATE 25 MG PO TABS: 25.0000 mg | ORAL_TABLET | Freq: Every day | ORAL | 1 refills | Status: DC

## 2021-12-09 MED ORDER — QUETIAPINE FUMARATE 50 MG PO TABS: 50.0000 mg | ORAL_TABLET | Freq: Every day | ORAL | 1 refills | Status: DC

## 2021-12-09 NOTE — Patient Instructions (Addendum)
Wegovy   Zepbound

## 2021-12-09 NOTE — Progress Notes (Unsigned)
BP 113/70   Pulse 60   Temp 98.1 F (36.7 C)   Wt 259 lb (117.5 kg)   LMP 08/13/2017 (Exact Date)   SpO2 98%   BMI 50.58 kg/m    Subjective:    Patient ID: Holly Nicholson, female    DOB: [birth year], 36 y.o.   MRN: [id number]  HPI: Holly Nicholson is a 36 y.o. female  Chief Complaint  Patient presents with   Anxiety    Patient states she took Seroquel XR 50MG  for about 3 weeks and was having bad dreams from it so patient stopped taking. Is taking seroquel 25mg  and seroquel 50mg  daily.    Depression   DEPRESSION Mood status: {Blank single:19197::"controlled","uncontrolled","better","worse","exacerbated","stable"} Satisfied with current treatment?: {Blank single:19197::"yes","no"} Symptom severity: {Blank single:19197::"mild","moderate","severe"}  Duration of current treatment : {Blank single:19197::"chronic","months","years"} Side effects: {Blank single:19197::"yes","no"} Medication compliance: {Blank single:19197::"excellent compliance","good compliance","fair compliance","poor compliance"} Psychotherapy/counseling: {Blank single:19197::"yes","no"} {Blank single:19197::"current","in the past"} Previous psychiatric medications: {Blank multiple:19196::"abilify","amitryptiline","buspar","celexa","cymbalta","depakote","effexor","lamictal","lexapro","lithium","nortryptiline","paxil","prozac","pristiq (desvenlafaxine","seroquel","wellbutrin","zoloft","zyprexa"} Depressed mood: {Blank single:19197::"yes","no"} Anxious mood: {Blank single:19197::"yes","no"} Anhedonia: {Blank single:19197::"yes","no"} Significant weight loss or gain: {Blank single:19197::"yes","no"} Insomnia: {Blank single:19197::"yes","no"} {Blank single:19197::"hard to fall asleep","hard to stay asleep"} Fatigue: {Blank single:19197::"yes","no"} Feelings of worthlessness or guilt: {Blank single:19197::"yes","no"} Impaired concentration/indecisiveness: {Blank single:19197::"yes","no"} Suicidal ideations: {Blank  single:19197::"yes","no"} Hopelessness: {Blank single:19197::"yes","no"} Crying spells: {Blank single:19197::"yes","no"}    11/11/2021    3:50 PM 02/10/2019    4:55 PM 07/10/2017   10:05 AM 04/25/2017    9:56 AM 12/22/2016   11:38 AM  Depression screen PHQ 2/9  Decreased Interest 1 1 2  0 1  Down, Depressed, Hopeless 1 2 2 1 1   PHQ - 2 Score 2 3 4 1 2   Altered sleeping 0 3 3 2 2   Tired, decreased energy 1 2 3 2 1   Change in appetite 3 3 3 3 2   Feeling bad or failure about yourself  1 2 3 1 2   Trouble concentrating 3 3 3 1  0  Moving slowly or fidgety/restless 3 2 3 2  0  Suicidal thoughts 0 0 0 0 0  PHQ-9 Score 13 18 22 12 9   Difficult doing work/chores     Somewhat difficult    Relevant past medical, surgical, family and social history reviewed and updated as indicated. Interim medical history since our last visit reviewed. Allergies and medications reviewed and updated.  Review of Systems  Constitutional: Negative.   Respiratory: Negative.    Cardiovascular: Negative.   Gastrointestinal: Negative.   Musculoskeletal: Negative.     Per HPI unless specifically indicated above     Objective:    BP 113/70   Pulse 60   Temp 98.1 F (36.7 C)   Wt 259 lb (117.5 kg)   LMP 08/13/2017 (Exact Date)   SpO2 98%   BMI 50.58 kg/m   Wt Readings from Last 3 Encounters:  12/09/21 259 lb (117.5 kg)  11/22/21 260 lb (117.9 kg)  11/11/21 256 lb 3.2 oz (116.2 kg)    Physical Exam  Results for orders placed or performed during the hospital encounter of 11/22/21  CBC  Result Value Ref Range   WBC 5.4 4.0 - 10.5 K/uL   RBC 4.02 3.87 - 5.11 MIL/uL   Hemoglobin 12.6 12.0 - 15.0 g/dL   HCT (L) - %   MCV 89.3 80.0 - 100.0 fL   MCH 31.3 26.0 - 34.0 pg   MCHC 35.1 30.0 - 36.0 g/dL   RDW  - %   Platelets 168 150 - 400  K/uL   nRBC 0.0 0.0 - 0.2 %  Comprehensive metabolic panel  Result Value Ref Range   Sodium 140 135 - 145 mmol/L   Potassium 4.2 3.5 - 5.1  mmol/L   Chloride 108 98 - 111 mmol/L   CO2 27 22 - 32 mmol/L   Glucose, Bld 103 (H) 70 - 99 mg/dL   BUN 8 6 - 20 mg/dL   Creatinine, Ser 3.81 0.44 - 1.00 mg/dL   Calcium 9.1 8.9 - 82.9 mg/dL   Total Protein 6.9 6.5 - 8.1 g/dL   Albumin 3.9 3.5 - 5.0 g/dL   AST 20 15 - 41 U/L   ALT 25 0 - 44 U/L   Alkaline Phosphatase 83 38 - 126 U/L   Total Bilirubin 0.9 0.3 - 1.2 mg/dL   GFR, Estimated >93 >71 mL/min   Anion gap 5 5 - 15  Troponin I (High Sensitivity)  Result Value Ref Range   Troponin I (High Sensitivity) 2 <18 ng/L      Assessment & Plan:   Problem List Items Addressed This Visit   None    Follow up plan: No follow-ups on file.

## 2021-12-11 ENCOUNTER — Encounter: Payer: Self-pay | Admitting: Family Medicine

## 2021-12-11 NOTE — Assessment & Plan Note (Signed)
Stable on current regimen. Continue current regimen. Continue to monitor. Call with any concerns. Refills given.  

## 2021-12-16 ENCOUNTER — Ambulatory Visit (INDEPENDENT_AMBULATORY_CARE_PROVIDER_SITE_OTHER): Payer: BC Managed Care – PPO

## 2021-12-16 DIAGNOSIS — E538 Deficiency of other specified B group vitamins: Secondary | ICD-10-CM

## 2021-12-27 ENCOUNTER — Ambulatory Visit: Payer: BC Managed Care – PPO

## 2021-12-28 ENCOUNTER — Other Ambulatory Visit: Payer: Self-pay | Admitting: Medical

## 2021-12-29 ENCOUNTER — Ambulatory Visit (INDEPENDENT_AMBULATORY_CARE_PROVIDER_SITE_OTHER): Payer: BC Managed Care – PPO

## 2021-12-29 DIAGNOSIS — E538 Deficiency of other specified B group vitamins: Secondary | ICD-10-CM

## 2021-12-29 MED ORDER — CYANOCOBALAMIN 1000 MCG/ML IJ SOLN
1000.0000 ug | Freq: Once | INTRAMUSCULAR | Status: AC
  Administered 2021-12-29: 1000 ug via INTRAMUSCULAR

## 2022-01-05 ENCOUNTER — Ambulatory Visit (INDEPENDENT_AMBULATORY_CARE_PROVIDER_SITE_OTHER): Payer: BC Managed Care – PPO

## 2022-01-05 DIAGNOSIS — E538 Deficiency of other specified B group vitamins: Secondary | ICD-10-CM

## 2022-01-05 NOTE — Progress Notes (Signed)
Patient presents today for B-12 injection, patient received in Right deltoid, patient tolerated well.   

## 2022-01-09 DIAGNOSIS — Z419 Encounter for procedure for purposes other than remedying health state, unspecified: Secondary | ICD-10-CM | POA: Diagnosis not present

## 2022-01-20 ENCOUNTER — Ambulatory Visit: Payer: BC Managed Care – PPO | Admitting: Family Medicine

## 2022-02-09 DIAGNOSIS — Z419 Encounter for procedure for purposes other than remedying health state, unspecified: Secondary | ICD-10-CM | POA: Diagnosis not present

## 2022-03-10 DIAGNOSIS — Z419 Encounter for procedure for purposes other than remedying health state, unspecified: Secondary | ICD-10-CM | POA: Diagnosis not present

## 2022-03-28 ENCOUNTER — Encounter: Payer: Self-pay | Admitting: Emergency Medicine

## 2022-03-28 ENCOUNTER — Emergency Department: Payer: BC Managed Care – PPO

## 2022-03-28 ENCOUNTER — Emergency Department
Admission: EM | Admit: 2022-03-28 | Discharge: 2022-03-28 | Disposition: A | Discharge status: Home / Self Care | Payer: BC Managed Care – PPO | Attending: Emergency Medicine | Admitting: Emergency Medicine

## 2022-03-28 ENCOUNTER — Other Ambulatory Visit: Payer: Self-pay

## 2022-03-28 DIAGNOSIS — M25562 Pain in left knee: Secondary | ICD-10-CM | POA: Diagnosis not present

## 2022-03-28 DIAGNOSIS — M25561 Pain in right knee: Secondary | ICD-10-CM | POA: Diagnosis not present

## 2022-03-28 DIAGNOSIS — W19XXXA Unspecified fall, initial encounter: Secondary | ICD-10-CM | POA: Diagnosis not present

## 2022-03-28 MED ORDER — CYCLOBENZAPRINE HCL 5 MG PO TABS: 5.0000 mg | ORAL_TABLET | Freq: Three times a day (TID) | ORAL | 0 refills | Status: DC | PRN

## 2022-03-28 NOTE — Discharge Instructions (Addendum)
Your exam and x-ray are consistent with a knee sprain.  No signs of any fracture or dislocation.  Wear the Ace bandage as needed for support.  Take OTC naproxen as discussed.  Take the muscle laxer only as needed.  Follow-up with primary provider or Ortho for ongoing symptoms.

## 2022-03-28 NOTE — ED Triage Notes (Signed)
Patient ambulatory to triage for right knee pain. Patient states she fell and having pain "for a few days."

## 2022-03-28 NOTE — ED Provider Notes (Signed)
[facility] Emergency Department Provider Note     Event Date/Time   First MD Initiated Contact with Patient 03/28/22 1226     (approximate)   History   Knee Pain   HPI  Holly Nicholson is a 37 y.o. female resents to the ED for evaluation of right knee pain.  Patient reports pain after mechanical fall few days later.  Patient denies any joint effusion or history of chronic ongoing knee pain.  Physical Exam   Triage Vital Signs: ED Triage Vitals  Enc Vitals Group     BP 03/28/22 1225 131/83     Pulse Rate 03/28/22 1225 (!) 56     Resp 03/28/22 1225 18     Temp 03/28/22 1224 98.1 F (36.7 C)     Temp Source 03/28/22 1224 Oral     SpO2 03/28/22 1225 100 %     Weight --      Height --      Head Circumference --      Peak Flow --      Pain Score --      Pain Loc --      Pain Edu? --      Excl. in [place]? --     Most recent vital signs: Vitals:   03/28/22 1224 03/28/22 1225  BP:  131/83  Pulse:  (!) 56  Resp:  18  Temp: 98.1 F (36.7 C)   SpO2:  100%    General Awake, no distress. NAD CV:  Good peripheral perfusion.  RESP:  Normal effort.  ABD:  No distention.  MSK:  Right knee without obvious deformity, joint effusion, or or dislocation.  Patient with full active range of motion to the knee.  No valgus or varus joint stresses elicited.  She mild tender palpation to the medial patellar border.  No calf or Achilles tenderness noted distally.   ED Results / Procedures / Treatments   Labs (all labs ordered are listed, but only abnormal results are displayed) Labs Reviewed - No data to display   EKG   RADIOLOGY  I personally viewed and evaluated these images as part of my medical decision making, as well as reviewing the written report by the radiologist.  ED Provider Interpretation: No acute findings  DG Knee Complete 4 Views Right  Result Date: 03/28/2022 CLINICAL DATA:  Right knee pain after fall. EXAM: RIGHT KNEE - COMPLETE  4+ VIEW COMPARISON:  None Available. FINDINGS: No evidence of fracture, dislocation, or joint effusion. No evidence of arthropathy or other focal bone abnormality. Soft tissues are unremarkable. IMPRESSION: Negative. Electronically Signed   By: Marijo Conception M.D.   On: 03/28/2022 13:20     PROCEDURES:  Critical Care performed: No  Procedures   MEDICATIONS ORDERED IN ED: Medications - No data to display   IMPRESSION / MDM / [place] / ED COURSE  I reviewed the triage vital signs and the nursing notes.                              Differential diagnosis includes, but is not limited to, knee sprain, knee fracture, knee dislocation, DJD  Patient's presentation is most consistent with   Patient's diagnosis is consistent with knee sprain without evidence of underlying fracture.  No radiologic evidence of any acute fracture, dislocation, or bony arthropathy based on my interpretation. Patient will be discharged home with prescriptions for  cyclobenzaprine to take with OTC Tylenol or Motrin. Patient is to follow up with primary provider as needed or otherwise directed. Patient is given ED precautions to return to the ED for any worsening or new symptoms.  FINAL CLINICAL IMPRESSION(S) / ED DIAGNOSES   Final diagnoses:  Acute pain of left knee     Rx / DC Orders   ED Discharge Orders          Ordered    cyclobenzaprine (FLEXERIL) 5 MG tablet  3 times daily PRN        03/28/22 1335             Note:  This document was prepared using Dragon voice recognition software and may include unintentional dictation errors.    Melvenia Needles, PA-C 03/28/22 2010    Vanessa Flora, MD 04/01/22 [phone]

## 2022-04-10 DIAGNOSIS — Z419 Encounter for procedure for purposes other than remedying health state, unspecified: Secondary | ICD-10-CM | POA: Diagnosis not present

## 2022-04-19 ENCOUNTER — Encounter: Payer: Self-pay | Admitting: *Deleted

## 2022-04-19 ENCOUNTER — Other Ambulatory Visit: Payer: Self-pay

## 2022-04-19 ENCOUNTER — Emergency Department
Admission: EM | Admit: 2022-04-19 | Discharge: 2022-04-19 | Disposition: A | Discharge status: Home / Self Care | Payer: BC Managed Care – PPO | Attending: Emergency Medicine | Admitting: Emergency Medicine

## 2022-04-19 ENCOUNTER — Emergency Department: Payer: BC Managed Care – PPO

## 2022-04-19 DIAGNOSIS — R0789 Other chest pain: Secondary | ICD-10-CM | POA: Insufficient documentation

## 2022-04-19 DIAGNOSIS — R079 Chest pain, unspecified: Secondary | ICD-10-CM

## 2022-04-19 DIAGNOSIS — I10 Essential (primary) hypertension: Secondary | ICD-10-CM | POA: Diagnosis not present

## 2022-04-19 LAB — CBC
HCT: 36.1 % (ref 36.0–46.0)
Hemoglobin: 12.5 g/dL (ref 12.0–15.0)
MCH: 31.6 pg (ref 26.0–34.0)
MCHC: 34.6 g/dL (ref 30.0–36.0)
MCV: 91.2 fL (ref 80.0–100.0)
Platelets: 168 10*3/uL (ref 150–400)
RBC: 3.96 MIL/uL (ref 3.87–5.11)
RDW: 13.2 % (ref 11.5–15.5)
WBC: 6.3 10*3/uL (ref 4.0–10.5)
nRBC: 0 % (ref 0.0–0.2)

## 2022-04-19 LAB — BASIC METABOLIC PANEL
Anion gap: 6 (ref 5–15)
BUN: 7 mg/dL (ref 6–20)
CO2: 25 mmol/L (ref 22–32)
Calcium: 8.6 mg/dL — ABNORMAL LOW (ref 8.9–10.3)
Chloride: 106 mmol/L (ref 98–111)
Creatinine, Ser: 0.84 mg/dL (ref 0.44–1.00)
GFR, Estimated: >60 mL/min (ref >60)
Glucose, Bld: 115 mg/dL — ABNORMAL HIGH (ref 70–99)
Potassium: 3.6 mmol/L (ref 3.5–5.1)
Sodium: 137 mmol/L (ref 135–145)

## 2022-04-19 LAB — TROPONIN I (HIGH SENSITIVITY)
Troponin I (High Sensitivity): 2 ng/L (ref <18)
Troponin I (High Sensitivity): 3 ng/L (ref <18)

## 2022-04-19 MED ORDER — LIDOCAINE VISCOUS HCL 2 % MT SOLN
15.0000 mL | Freq: Once | OROMUCOSAL | Status: AC
  Administered 2022-04-19: 15 mL via ORAL
  Filled 2022-04-19: qty 15

## 2022-04-19 MED ORDER — ALUM & MAG HYDROXIDE-SIMETH 200-200-20 MG/5ML PO SUSP
30.0000 mL | Freq: Once | ORAL | Status: AC
  Administered 2022-04-19: 30 mL via ORAL
  Filled 2022-04-19: qty 30

## 2022-04-19 NOTE — ED Provider Notes (Signed)
[facility] Provider Note    Event Date/Time   First MD Initiated Contact with Patient 04/19/22 2222     (approximate)  History   Chief Complaint: Chest Pain  HPI  Holly Nicholson is a 37 y.o. female with a past medical history of anxiety, hypertension, depression, prolonged QT syndrome who presents to the emergency department for chest pain.  According to the patient approximately 1 to 1.5 hours prior to arrival she began experiencing some discomfort in the center of the left chest.  Patient states she has been prescribed nitroglycerin to take for chest pain she took 2 tablets spaced out by 15 minutes without significant relief so the patient came to the emergency department.  Patient states she was not going to come but her ex-husband convinced her to come get evaluated.  Patient denies any shortness of breath or diaphoresis.  Denies any heart attacks or stents previously.  Physical Exam   Triage Vital Signs: ED Triage Vitals  Enc Vitals Group     BP 04/19/22 2104 125/73     Pulse Rate 04/19/22 2104 61     Resp 04/19/22 2104 20     Temp 04/19/22 2104 (!) 96.7 F (35.9 C)     Temp Source 04/19/22 2104 Oral     SpO2 04/19/22 2104 98 %     Weight 04/19/22 2057 260 lb (117.9 kg)     Height 04/19/22 2057 5' (1.524 m)     Head Circumference --      Peak Flow --      Pain Score 04/19/22 2057 9     Pain Loc --      Pain Edu? --      Excl. in [place]? --     Most recent vital signs: Vitals:   04/19/22 2104  BP: 125/73  Pulse: 61  Resp: 20  Temp: (!) 96.7 F (35.9 C)  SpO2: 98%    General: Awake, no distress.  CV:  Good peripheral perfusion.  Regular rate and rhythm  Resp:  Normal effort.  Equal breath sounds bilaterally.  Abd:  No distention.  Soft, nontender.  No rebound or guarding.  ED Results / Procedures / Treatments   EKG  EKG viewed and interpreted by myself shows sinus bradycardia 57 bpm the narrow QRS, normal axis, normal intervals, no  concerning ST changes.  RADIOLOGY  I have reviewed and interpreted chest x-ray images.  No consolidation seen on my evaluation. Radiology is read the chest x-ray is negative.   MEDICATIONS ORDERED IN ED: Medications - No data to display   IMPRESSION / MDM / ASSESSMENT AND PLAN / ED COURSE  I reviewed the triage vital signs and the nursing notes.  Patient's presentation is most consistent with acute presentation with potential threat to life or bodily function.  Patient presents emergency department for chest pain starting approximate 1.5 hours prior to arrival.  Overall the patient appears well, no distress.  States very minimal discomfort currently in the center of her chest.  Patient does have mild tenderness to palpation of the sternum as well.  Reassuringly patient's labs show no concerning findings with a normal CBC, reassuring chemistry and a negative troponin.  EKG is reassuring with no significant findings and chest x-ray is clear.  However given the patient's onset of symptoms approximate 1.5 hours prior to arrival we will recheck a troponin around 11 PM.  If the patient's repeat troponin remains negative anticipate likely discharge home.  Given  the patient's slight discomfort we will try a GI cocktail to see if this helps with the patient's discomfort.  Patient's repeat troponin is negative.  Given the patient's reassuring workup I believe the patient is safe for discharge home with PCP follow-up.  Patient agreeable to plan of care.  FINAL CLINICAL IMPRESSION(S) / ED DIAGNOSES   Chest pain   Note:  This document was prepared using Dragon voice recognition software and may include unintentional dictation errors.   Minna Antis, MD 04/19/22 2316

## 2022-04-19 NOTE — ED Triage Notes (Signed)
Pt states she has left side chest pain for 1.5 hours.  Pt took ntg x 2 with some relief.  Pain radiates into both arms and back  Pt also reports a headache.  No sob.  Cig smoker.  No cough.  Pt alert  speech clear.

## 2022-05-10 DIAGNOSIS — Z419 Encounter for procedure for purposes other than remedying health state, unspecified: Secondary | ICD-10-CM | POA: Diagnosis not present

## 2022-06-10 DIAGNOSIS — Z419 Encounter for procedure for purposes other than remedying health state, unspecified: Secondary | ICD-10-CM | POA: Diagnosis not present

## 2022-07-10 DIAGNOSIS — Z419 Encounter for procedure for purposes other than remedying health state, unspecified: Secondary | ICD-10-CM | POA: Diagnosis not present

## 2022-07-15 NOTE — Progress Notes (Unsigned)
Cardiology Office Note Date:  07/17/2022  Patient ID:  Holly Nicholson, Holly Nicholson Sep 02, 1985, MRN [id number] PCP:  Dorcas Carrow, DO  Cardiologist:  None Electrophysiologist: Sherryl Manges, MD    Chief Complaint: swelling  History of Present Illness: Holly Nicholson is a 37 y.o. female with PMH notable for QT prolongation, HTN, atypical chest pain, anxiety, bipolar, tobacco use, IBS; seen today for Sherryl Manges, MD for acute visit due to increased swelling.   She has h/o profound QT prolongation up to about .  The patient last saw Dr. Graciela Husbands 08/2021 in ER follow-up for typical and atypical CP. QT during the ER visit was ~436ms.  During visit noted to have mild DOE, asymmetric long-standing edema, CP not associated with exertion. Dr. Graciela Husbands recommended CTA which showed CAD-RADS 0.  On follow-up today, she has many complaints.  patient states that she drinks a large amount of water throughout the day, but does not urinate as much as she thinks she should. She has a 40oz water bottle and fills it 4 times per day. She is always thirsty and doesn't think she is going to be able to drink less than this amount. She thinks one of her medications is causing dry mouth.   She urinates about 6 times per day, urine is yellow - not straw but also not dark. She has increased lower extremity edema and is concerned that if she has increased lower extremity edema, she may also have edema around her heart.  She sleeps elevated on two pillows chronically because she feels SOB when lying flat.  Somewhat decreased appetite - frequently snacks. Has frequent bloating but thinks it's because of her IBS.  She continues to smoke more than 1ppd. Strongly desires to quit, but does not want to stop "cold [place]" as she has done this in the past and felt like it shocked her system.  She also has decreased sensation to bilat hands and feet - states this is not new. She has heard this could be a complication of diabetes and  questions whether she has DM.     she denies chest pain, palpitations, syncope, and lightheadedness.    Past Medical History:  Diagnosis Date   Anxiety    BMI 45.0-49.9, adult (HCC)    Depression    Elevated blood pressure    GERD (gastroesophageal reflux disease)    History of bilateral tubal ligation 2007   History of bilateral tubal ligation 2007   Hypertension    Low-lying placenta    Lower extremity edema    Tubal pregnancy Aug 2016    Past Surgical History:  Procedure Laterality Date   ABDOMINAL HYSTERECTOMY     CHOLECYSTECTOMY N/A 09/02/2015   Procedure: LAPAROSCOPIC CHOLECYSTECTOMY;  Surgeon: Leafy Ro, MD;  Location: [facility] ORS;  Service: General;  Laterality: N/A;   CHOLECYSTECTOMY     KNEE SURGERY     LAPAROSCOPIC HYSTERECTOMY Bilateral 10/30/2017   Procedure: HYSTERECTOMY TOTAL LAPAROSCOPIC-BILATERAL SALPINGECTOMY;  Surgeon: Vena Austria, MD;  Location: [facility] ORS;  Service: Gynecology;  Laterality: Bilateral;   LAPAROSCOPIC SALPINGO OOPHERECTOMY N/A 10/19/2016   Procedure: LAPAROSCOPIC SALPINGECTOMY bilateral;  Surgeon: Nadara Mustard, MD;  Location: [facility] OR[facility];  Service: Gynecology;  Laterality: N/A;   TUBAL LIGATION     TUBAL LIGATION Bilateral    Tubal reanastamosis      Current Outpatient Medications  Medication Instructions   aspirin EC 81 mg, Oral, Daily, Swallow whole.   atorvastatin (LIPITOR) 10 mg, Oral, Daily  nadolol (CORGARD) 40 mg, Oral, Daily   nitroGLYCERIN (NITROSTAT) 0.4 mg, Sublingual, Every 5 min PRN   pantoprazole (PROTONIX) 40 mg, Oral, Daily   QUEtiapine (SEROQUEL) 25 mg, Oral, Daily at bedtime   QUEtiapine (SEROQUEL) 50 mg, Oral, Daily at bedtime    Social History:  The patient  reports that she has been smoking cigarettes. She has been smoking an average of 1 pack per day. She has never used smokeless tobacco. She reports that she does not currently use alcohol. She reports that she does not currently use drugs after having  used the following drugs: Marijuana.   Family History:  The patient's family history includes Anxiety disorder in her maternal aunt and mother; Asthma in her son; Cancer in her maternal grandfather and paternal grandmother; Depression in her maternal aunt and mother; Diabetes in her father; Epilepsy in her daughter; Heart disease in her father and mother; Hyperlipidemia in her father and mother; Hypertension in her father and mother; Stroke in her father and paternal grandfather.  ROS:  Please see the history of present illness. All other systems are reviewed and otherwise negative.   PHYSICAL EXAM:  VS:  BP 110/84 (BP Location: Left Arm, Patient Position: Sitting, Cuff Size: Large)   Pulse (!) 55   Ht 5' (1.524 m)   Wt 258 lb (117 kg)   LMP 08/13/2017 (Exact Date)   SpO2 92% Comment: wearing nail polish  BMI 50.39 kg/m  BMI: Body mass index is 50.39 kg/m.  GEN- The patient is well appearing, alert and oriented x 3 today.   Lungs- Clear to ausculation bilaterally, normal work of breathing.  Heart- Regular rate and rhythm, no murmurs, rubs or gallops Extremities- 1+ peripheral edema, warm, dry   EKG is ordered. Personal review of EKG from today shows:    EKG Interpretation Date/Time:  Monday July 17 2022 11:01:21 EDT Ventricular Rate:  55 PR Interval:  156 QRS Duration:  80 QT Interval:  414 QTC Calculation: 396 R Axis:   2  Text Interpretation: Sinus bradycardia When compared with ECG of 19-Apr-2022 21:03, No significant change was found Confirmed by Sherie Don [phone]) on 07/17/2022 11:17:39 AM    Recent Labs: 11/11/2021: TSH 1.080 11/22/2021: ALT 25 04/19/2022: BUN 7; Creatinine, Ser 0.84; Hemoglobin 12.5; Platelets 168; Potassium 3.6; Sodium 137  11/11/2021: Cholesterol, Total 144; HDL 34; LDL Chol Calc (NIH) 69; Triglycerides 249   CrCl cannot be calculated (Patient's most recent lab result is older than the maximum 21 days allowed.).   Wt Readings from Last 3 Encounters:   07/17/22 258 lb (117 kg)  04/19/22 260 lb (117.9 kg)  12/09/21 259 lb (117.5 kg)     Additional studies reviewed include: Previous EP, cardiology notes.   CTA coronary, 09/26/2021 1. Coronary calcium score of 0. Patient is low risk for coronary events.  2. Normal coronary origin with right dominance.  3. No evidence of CAD.  4. CAD-RADS 0. Consider non-atherosclerotic causes of chest pain.  5. Image quality degraded by obesity related artifacts.  ETT, 03/08/2021 nORMAL QT response to exercise and recovery   TTE bubble, 02/06/2021  1. Left ventricular ejection fraction, by estimation, is 55 to 60%. The left ventricle has normal function. The left ventricle has no regional wall motion abnormalities. There is mild left ventricular hypertrophy. Left ventricular diastolic parameters are consistent with Grade II diastolic dysfunction (pseudonormalization).   2. Right ventricular systolic function is normal. The right ventricular size is normal.   3. The mitral  valve is normal in structure. Trivial mitral valve regurgitation. No evidence of mitral stenosis.   4. The aortic valve is tricuspid. Aortic valve regurgitation is not visualized. No aortic stenosis is present.   5. There is mild dilatation of the aortic arch, measuring 35 mm.   6. The inferior vena cava is normal in size with greater than 50% respiratory variability, suggesting right atrial pressure of 3 mmHg.   7. Agitated saline contrast bubble study was negative, with no evidence of any interatrial shunt.   ASSESSMENT AND PLAN:  #) h/o QT prolongation QTc not prolonged on today's EKG Avoid QT prolonging medications Continue nadolol  #) lower extremity edema PND at night, ?appetite changes LVEF preserved by most recent echo in 2023 - will update echo Discussed limiting PO fluids   #) excessive thirst #) paraesthesias Update A1C Update echo as above Follow-up with PCP for further evaluation/workup      Current  medicines are reviewed at length with the patient today.   The patient has concerns regarding her medicines.  The following changes were made today:  none  Labs/ tests ordered today include:  Orders Placed This Encounter  Procedures   HgB A1c   Basic Metabolic Panel (BMET)   B Nat Peptide   EKG 12-Lead   ECHOCARDIOGRAM COMPLETE     Disposition: Follow up with Dr. Graciela Husbands or EP APP in 6 months Follow-up with PCP at next available, I have recommended she reach out to PCP office to schedule appt.   Signed, Sherie Don, NP  07/17/22  11:51 AM  Electrophysiology [facility]

## 2022-07-17 ENCOUNTER — Encounter: Payer: Self-pay | Admitting: Cardiology

## 2022-07-17 ENCOUNTER — Ambulatory Visit: Payer: BC Managed Care – PPO | Attending: Cardiology | Admitting: Cardiology

## 2022-07-17 ENCOUNTER — Other Ambulatory Visit
Admission: RE | Admit: 2022-07-17 | Discharge: 2022-07-17 | Disposition: A | Discharge status: Home / Self Care | Payer: BC Managed Care – PPO | Attending: Cardiology | Admitting: Cardiology

## 2022-07-17 VITALS — BP 110/84 | HR 55 | Ht 60.0 in | Wt 258.0 lb

## 2022-07-17 DIAGNOSIS — R609 Edema, unspecified: Secondary | ICD-10-CM

## 2022-07-17 DIAGNOSIS — Z87898 Personal history of other specified conditions: Secondary | ICD-10-CM | POA: Insufficient documentation

## 2022-07-17 DIAGNOSIS — R631 Polydipsia: Secondary | ICD-10-CM | POA: Diagnosis not present

## 2022-07-17 DIAGNOSIS — R079 Chest pain, unspecified: Secondary | ICD-10-CM

## 2022-07-17 DIAGNOSIS — I10 Essential (primary) hypertension: Secondary | ICD-10-CM

## 2022-07-17 LAB — BASIC METABOLIC PANEL
Anion gap: 8 (ref 5–15)
BUN: 7 mg/dL (ref 6–20)
CO2: 25 mmol/L (ref 22–32)
Calcium: 8.8 mg/dL — ABNORMAL LOW (ref 8.9–10.3)
Chloride: 104 mmol/L (ref 98–111)
Creatinine, Ser: 0.79 mg/dL (ref 0.44–1.00)
GFR, Estimated: >60 mL/min (ref >60)
Glucose, Bld: 93 mg/dL (ref 70–99)
Potassium: 4 mmol/L (ref 3.5–5.1)
Sodium: 137 mmol/L (ref 135–145)

## 2022-07-17 LAB — BRAIN NATRIURETIC PEPTIDE: B Natriuretic Peptide: 52.5 pg/mL (ref 0.0–100.0)

## 2022-07-17 NOTE — Patient Instructions (Addendum)
Medication Instructions:  Your physician recommends that you continue on your current medications as directed. Please refer to the Current Medication list given to you today.  *If you need a refill on your cardiac medications before your next appointment, please call your pharmacy*   Lab Work: BMP, A1C, and BNP - Please go to the [facility]. You will check in at the front desk to the right as you walk into the atrium. Valet Parking is offered if needed. - No appointment needed. You may go any day between 7 am and 6 pm.  If you have labs (blood work) drawn today and your tests are completely normal, you will receive your results only by: MyChart Message (if you have MyChart) OR A paper copy in the mail If you have any lab test that is abnormal or we need to change your treatment, we will call you to review the results.   Testing/Procedures: Your physician has requested that you have an echocardiogram. Echocardiography is a painless test that uses sound waves to create images of your heart. It provides your doctor with information about the size and shape of your heart and how well your heart's chambers and valves are working. This procedure takes approximately one hour. There are no restrictions for this procedure. Please do NOT wear cologne, perfume, aftershave, or lotions (deodorant is allowed). Please arrive 15 minutes prior to your appointment time.  Follow-Up: At [facility], you and your health needs are our priority.  As part of our continuing mission to provide you with exceptional heart care, we have created designated Provider Care Teams.  These Care Teams include your primary Cardiologist (physician) and Advanced Practice Providers (APPs -  Physician Assistants and Nurse Practitioners) who all work together to provide you with the care you need, when you need it.  We recommend signing up for the patient portal called "MyChart".  Sign up information is provided on  this After Visit Summary.  MyChart is used to connect with patients for Virtual Visits (Telemedicine).  Patients are able to view lab/test results, encounter notes, upcoming appointments, etc.  Non-urgent messages can be sent to your provider as well.   To learn more about what you can do with MyChart, go to [email or web address].    Your next appointment:   6 month(s)  Provider:   Sherryl Manges, MD   Other Instructions Please schedule office visit with your primary care provider.

## 2022-07-18 LAB — HEMOGLOBIN A1C
Hgb A1c MFr Bld: 5.3 % (ref 4.8–5.6)
Mean Plasma Glucose: 105 mg/dL

## 2022-08-10 DIAGNOSIS — Z419 Encounter for procedure for purposes other than remedying health state, unspecified: Secondary | ICD-10-CM | POA: Diagnosis not present

## 2022-08-14 ENCOUNTER — Ambulatory Visit: Payer: Medicaid Other | Attending: Cardiology

## 2022-08-29 ENCOUNTER — Other Ambulatory Visit: Payer: Self-pay

## 2022-08-29 ENCOUNTER — Encounter: Payer: Self-pay | Admitting: Intensive Care

## 2022-08-29 ENCOUNTER — Emergency Department
Admission: EM | Admit: 2022-08-29 | Discharge: 2022-08-29 | Disposition: A | Discharge status: Home / Self Care | Payer: Medicaid Other | Attending: Emergency Medicine | Admitting: Emergency Medicine

## 2022-08-29 DIAGNOSIS — H1031 Unspecified acute conjunctivitis, right eye: Secondary | ICD-10-CM | POA: Diagnosis not present

## 2022-08-29 DIAGNOSIS — H5789 Other specified disorders of eye and adnexa: Secondary | ICD-10-CM | POA: Diagnosis not present

## 2022-08-29 DIAGNOSIS — B9689 Other specified bacterial agents as the cause of diseases classified elsewhere: Secondary | ICD-10-CM | POA: Diagnosis not present

## 2022-08-29 HISTORY — DX: Long QT syndrome: I45.81

## 2022-08-29 MED ORDER — MOXIFLOXACIN HCL 0.5 % OP SOLN: 1.0000 [drp] | Freq: Three times a day (TID) | OPHTHALMIC | 0 refills | Status: AC

## 2022-08-29 NOTE — ED Triage Notes (Signed)
Patient presents with swollen right eye and clear drainage. Eye is red  History of legally blind in left eye

## 2022-08-29 NOTE — Discharge Instructions (Addendum)
Apply one drop of Vigamox to right eye three times daily for seven days.

## 2022-08-29 NOTE — ED Provider Notes (Signed)
[facility] Provider Note  Patient Contact: 7:26 PM (approximate)   History   Facial Swelling   HPI  Holly Nicholson is a 37 y.o. female presents to the emergency department with right eye irritation and crusting at the medial and lateral canthi for the past 24 hours.  Patient states that she has eye discomfort but no pain.  No pain with extraocular eye muscle movement.  No use of contact lenses.  Patient states that she wears readers but does not wear glasses regularly.  No foreign body sensation.  Patient does not have any difficulty keeping her right eye open.      Physical Exam   Triage Vital Signs: ED Triage Vitals  Encounter Vitals Group     BP 08/29/22 1800 (!) 138/95     Systolic BP Percentile --      Diastolic BP Percentile --      Pulse Rate 08/29/22 1800 62     Resp 08/29/22 1800 18     Temp 08/29/22 1800 98.3 F (36.8 C)     Temp Source 08/29/22 1800 Oral     SpO2 08/29/22 1800 99 %     Weight 08/29/22 1806 257 lb (116.6 kg)     Height 08/29/22 1806 5' (1.524 m)     Head Circumference --      Peak Flow --      Pain Score 08/29/22 1806 7     Pain Loc --      Pain Education --      Exclude from Growth Chart --     Most recent vital signs: Vitals:   08/29/22 1800  BP: (!) 138/95  Pulse: 62  Resp: 18  Temp: 98.3 F (36.8 C)  SpO2: 99%     General: Alert and in no acute distress. Eyes:  PERRL. EOMI. patient has right eye conjunctivitis visualized. Head: No acute traumatic findings ENT:      Nose: No congestion/rhinnorhea.      Mouth/Throat: Mucous membranes are moist.  Neck: No stridor. No cervical spine tenderness to palpation. Cardiovascular:  Good peripheral perfusion Respiratory: Normal respiratory effort without tachypnea or retractions. Lungs CTAB. Good air entry to the bases with no decreased or absent breath sounds. Gastrointestinal: Bowel sounds 4 quadrants. Soft and nontender to palpation. No guarding or  rigidity. No palpable masses. No distention. No CVA tenderness. Musculoskeletal: Full range of motion to all extremities.  Neurologic:  No gross focal neurologic deficits are appreciated.  Skin:   No rash noted   ED Results / Procedures / Treatments   Labs (all labs ordered are listed, but only abnormal results are displayed) Labs Reviewed - No data to display      PROCEDURES:  Critical Care performed: No  Procedures   MEDICATIONS ORDERED IN ED: Medications - No data to display   IMPRESSION / MDM / ASSESSMENT AND PLAN / ED COURSE  I reviewed the triage vital signs and the nursing notes.                              Assessment and plan Conjunctivitis 37 year old female presents to the emergency department with right eye irritation and crusting within medial and lateral canthi concerning for conjunctivitis.  Patient was discharged with Vigamox.  Return precautions were given to return with new or worsening symptoms.      FINAL CLINICAL IMPRESSION(S) / ED DIAGNOSES   Final diagnoses:  Acute bacterial conjunctivitis of right eye     Rx / DC Orders   ED Discharge Orders          Ordered    moxifloxacin (VIGAMOX) 0.5 % ophthalmic solution  3 times daily        08/29/22 1925             Note:  This document was prepared using Dragon voice recognition software and may include unintentional dictation errors.   Pia Mau [place], PA-C 08/29/22 1934    Sharman Cheek, MD 08/29/22 [phone]

## 2022-09-01 ENCOUNTER — Other Ambulatory Visit: Payer: Self-pay | Admitting: Family Medicine

## 2022-09-01 NOTE — Telephone Encounter (Signed)
Requested medications are due for refill today.  yes  Requested medications are on the active medications list.  yes  Last refill. 12/09/2021 #90 1 rf  Future visit scheduled.   no  Notes to clinic.  Pt is prescribed both 25 and 50 mg. Refill not delegated.    Requested Prescriptions  Pending Prescriptions Disp Refills   QUEtiapine (SEROQUEL) 25 MG tablet [Pharmacy Med Name: QUETIAPINE FUMARATE 25 MG TAB] 90 tablet 1    Sig: TAKE 1 TABLET BY MOUTH EVERYDAY AT BEDTIME     Not Delegated - Psychiatry:  Antipsychotics - Second Generation (Atypical) - quetiapine Failed - 09/01/2022  1:35 AM      Failed - This refill cannot be delegated      Failed - Valid encounter within last 6 months    Recent Outpatient Visits           8 months ago Bipolar affective disorder, currently depressed, moderate (HCC)   [place] [facility] [place], Megan P, DO   9 months ago Fatigue, unspecified type   [place] [facility] [place], [place], DO   3 years ago Essential hypertension   [place] [facility] Roosvelt Maser [place], [place]   3 years ago Essential hypertension   [place] [facility] Roosvelt Maser [place], [place]   3 years ago Essential hypertension   [place] [facility] Roosvelt Maser [place], [place]              Failed - Lipid Panel in normal range within the last 12 months    Cholesterol, Total  Date Value Ref Range Status  11/11/2021 144 100 - 199 mg/dL Final   LDL Chol Calc (NIH)  Date Value Ref Range Status  11/11/2021 69 0 - 99 mg/dL Final   HDL  Date Value Ref Range Status  11/11/2021 34 (L) >39 mg/dL Final   Triglycerides  Date Value Ref Range Status  11/11/2021 249 (H) 0 - 149 mg/dL Final         Passed - TSH in normal range and within 360 days    TSH  Date Value Ref Range Status  11/11/2021 1.080 0.450 - 4.500 uIU/mL Final         Passed - Completed PHQ-2 or PHQ-9 in the last 360  days      Passed - Last BP in normal range    BP Readings from Last 1 Encounters:  08/29/22 138/89         Passed - Last Heart Rate in normal range    Pulse Readings from Last 1 Encounters:  08/29/22 66         Passed - CBC within normal limits and completed in the last 12 months    WBC  Date Value Ref Range Status  04/19/2022 6.3 4.0 - 10.5 K/uL Final   RBC  Date Value Ref Range Status  04/19/2022 3.96 3.87 - 5.11 MIL/uL Final   Hemoglobin  Date Value Ref Range Status  04/19/2022 12.5 12.0 - 15.0 g/dL Final  52/84/1324 40.1 11.1 - 15.9 g/dL Final  02/72/5366 44.0 g/dL Final   HCT  Date Value Ref Range Status  04/19/2022 36.1 36.0 - 46.0 % Final  10/21/2015 33 % Final   Hematocrit  Date Value Ref Range Status  11/11/2021 36.3 34.0 - 46.6 % Final   MCHC  Date Value Ref Range Status  04/19/2022 34.6 30.0 - 36.0 g/dL Final   MCH  Date  Value Ref Range Status  04/19/2022 31.6 26.0 - 34.0 pg Final   MCV  Date Value Ref Range Status  04/19/2022 91.2 80.0 - 100.0 fL Final  11/11/2021 89 79 - 97 fL Final  09/29/2013 87 80 - 100 fL Final   No results found for: "PLTCOUNTKUC", "LABPLAT", "POCPLA" RDW  Date Value Ref Range Status  04/19/2022 13.2 11.5 - 15.5 % Final  11/11/2021 13.2 11.7 - 15.4 % Final  09/29/2013 14.1 11.5 - 14.5 % Final         Passed - CMP within normal limits and completed in the last 12 months    Albumin  Date Value Ref Range Status  11/22/2021 3.9 3.5 - 5.0 g/dL Final  24/40/1027 4.6 3.9 - 4.9 g/dL Final  25/36/6440 3.7 3.4 - 5.0 g/dL Final   Alkaline Phosphatase  Date Value Ref Range Status  11/22/2021 83 38 - 126 U/L Final  03/08/2013 90 Unit/L Final    Comment:    45-117 NOTE: New Reference Range 11/29/12    ALT  Date Value Ref Range Status  11/22/2021 25 0 - 44 U/L Final   SGPT (ALT)  Date Value Ref Range Status  03/08/2013 29 12 - 78 U/L Final   AST  Date Value Ref Range Status  11/22/2021 20 15 - 41 U/L Final    SGOT(AST)  Date Value Ref Range Status  03/08/2013 23 15 - 37 Unit/L Final   BUN  Date Value Ref Range Status  07/17/2022 7 6 - 20 mg/dL Final  34/74/2595 7 6 - 20 mg/dL Final  63/87/5643 4 (L) 7 - 18 mg/dL Final   Calcium  Date Value Ref Range Status  07/17/2022 8.8 (L) 8.9 - 10.3 mg/dL Final   Calcium, Total  Date Value Ref Range Status  03/08/2013 8.6 8.5 - 10.1 mg/dL Final   CO2  Date Value Ref Range Status  07/17/2022 25 22 - 32 mmol/L Final   Co2  Date Value Ref Range Status  03/08/2013 25 21 - 32 mmol/L Final   Creatinine  Date Value Ref Range Status  03/08/2013 0.70 0.60 - 1.30 mg/dL Final   Creatinine, Ser  Date Value Ref Range Status  07/17/2022 0.79 0.44 - 1.00 mg/dL Final   Creatinine, Urine  Date Value Ref Range Status  06/08/2016 250 mg/dL Final   Glucose  Date Value Ref Range Status  03/08/2013 89 65 - 99 mg/dL Final   Glucose, Bld  Date Value Ref Range Status  07/17/2022 93 70 - 99 mg/dL Final    Comment:    Glucose reference range applies only to samples taken after fasting for at least 8 hours.   Glucose-Capillary  Date Value Ref Range Status  02/04/2021 96 70 - 99 mg/dL Final    Comment:    Glucose reference range applies only to samples taken after fasting for at least 8 hours.   Potassium  Date Value Ref Range Status  07/17/2022 4.0 3.5 - 5.1 mmol/L Final  03/08/2013 3.4 (L) 3.5 - 5.1 mmol/L Final   Sodium  Date Value Ref Range Status  07/17/2022 137 135 - 145 mmol/L Final  11/11/2021 140 134 - 144 mmol/L Final  03/08/2013 136 136 - 145 mmol/L Final   Total Bilirubin  Date Value Ref Range Status  11/22/2021 0.9 0.3 - 1.2 mg/dL Final   Bilirubin,Total  Date Value Ref Range Status  03/08/2013 0.5 0.2 - 1.0 mg/dL Final   Bilirubin Total  Date Value Ref Range  Status  11/11/2021 1.1 0.0 - 1.2 mg/dL Final   Protein, ur  Date Value Ref Range Status  12/24/2018 NEGATIVE NEGATIVE mg/dL Final   Protein,UA  Date Value Ref  Range Status  11/11/2021 Trace (A) Negative/Trace Final   Total Protein, Urine  Date Value Ref Range Status  06/08/2016 25 mg/dL Final    Comment:    NO NORMAL RANGE ESTABLISHED FOR THIS TEST   Total Protein  Date Value Ref Range Status  11/22/2021 6.9 6.5 - 8.1 g/dL Final  14/78/2956 6.9 6.0 - 8.5 g/dL Final  21/30/8657 7.5 6.4 - 8.2 g/dL Final   EGFR (African American)  Date Value Ref Range Status  03/08/2013 >60  Final   GFR calc Af Amer  Date Value Ref Range Status  02/10/2019 110 >59 mL/min/1.73 Final   eGFR  Date Value Ref Range Status  11/11/2021 93 >59 mL/min/1.73 Final   EGFR (Non-African Amer.)  Date Value Ref Range Status  03/08/2013 >60  Final    Comment:    eGFR values <38mL/min/1.73 m2 may be an indication of chronic kidney disease (CKD). Calculated eGFR is useful in patients with stable renal function. The eGFR calculation will not be reliable in acutely ill patients when serum creatinine is changing rapidly. It is not useful in  patients on dialysis. The eGFR calculation may not be applicable to patients at the low and high extremes of body sizes, pregnant women, and vegetarians.    GFR, Estimated  Date Value Ref Range Status  07/17/2022 >60 >60 mL/min Final    Comment:    (NOTE) Calculated using the CKD-EPI Creatinine Equation (2021)           QUEtiapine (SEROQUEL) 50 MG tablet [Pharmacy Med Name: QUETIAPINE FUMARATE 50 MG TAB] 90 tablet 1    Sig: TAKE 1 TABLET BY MOUTH EVERYDAY AT BEDTIME     Not Delegated - Psychiatry:  Antipsychotics - Second Generation (Atypical) - quetiapine Failed - 09/01/2022  1:35 AM      Failed - This refill cannot be delegated      Failed - Valid encounter within last 6 months    Recent Outpatient Visits           8 months ago Bipolar affective disorder, currently depressed, moderate (HCC)   [facility] [place], Megan P, DO   9 months ago Fatigue, unspecified type   [place]  [facility] [place], Megan P, DO   3 years ago Essential hypertension   [facility] Roosvelt Maser [place], [place]   3 years ago Essential hypertension   [place] [facility] Roosvelt Maser [place], [place]   3 years ago Essential hypertension    [facility] Roosvelt Maser [place], [place]              Failed - Lipid Panel in normal range within the last 12 months    Cholesterol, Total  Date Value Ref Range Status  11/11/2021 144 100 - 199 mg/dL Final   LDL Chol Calc (NIH)  Date Value Ref Range Status  11/11/2021 69 0 - 99 mg/dL Final   HDL  Date Value Ref Range Status  11/11/2021 34 (L) >39 mg/dL Final   Triglycerides  Date Value Ref Range Status  11/11/2021 249 (H) 0 - 149 mg/dL Final         Passed - TSH in normal range and within 360 days    TSH  Date Value Ref  Range Status  11/11/2021 1.080 0.450 - 4.500 uIU/mL Final         Passed - Completed PHQ-2 or PHQ-9 in the last 360 days      Passed - Last BP in normal range    BP Readings from Last 1 Encounters:  08/29/22 138/89         Passed - Last Heart Rate in normal range    Pulse Readings from Last 1 Encounters:  08/29/22 66         Passed - CBC within normal limits and completed in the last 12 months    WBC  Date Value Ref Range Status  04/19/2022 6.3 4.0 - 10.5 K/uL Final   RBC  Date Value Ref Range Status  04/19/2022 3.96 3.87 - 5.11 MIL/uL Final   Hemoglobin  Date Value Ref Range Status  04/19/2022 12.5 12.0 - 15.0 g/dL Final  16/10/9602 54.0 11.1 - 15.9 g/dL Final  98/11/9145 82.9 g/dL Final   HCT  Date Value Ref Range Status  04/19/2022 36.1 36.0 - 46.0 % Final  10/21/2015 33 % Final   Hematocrit  Date Value Ref Range Status  11/11/2021 36.3 34.0 - 46.6 % Final   MCHC  Date Value Ref Range Status  04/19/2022 34.6 30.0 - 36.0 g/dL Final   [facility]  Date Value Ref Range Status  04/19/2022 31.6 26.0 - 34.0 pg Final    MCV  Date Value Ref Range Status  04/19/2022 91.2 80.0 - 100.0 fL Final  11/11/2021 89 79 - 97 fL Final  09/29/2013 87 80 - 100 fL Final   No results found for: "PLTCOUNTKUC", "LABPLAT", "POCPLA" RDW  Date Value Ref Range Status  04/19/2022 13.2 11.5 - 15.5 % Final  11/11/2021 13.2 11.7 - 15.4 % Final  09/29/2013 14.1 11.5 - 14.5 % Final         Passed - CMP within normal limits and completed in the last 12 months    Albumin  Date Value Ref Range Status  11/22/2021 3.9 3.5 - 5.0 g/dL Final  56/21/3086 4.6 3.9 - 4.9 g/dL Final  57/84/6962 3.7 3.4 - 5.0 g/dL Final   Alkaline Phosphatase  Date Value Ref Range Status  11/22/2021 83 38 - 126 U/L Final  03/08/2013 90 Unit/L Final    Comment:    45-117 NOTE: New Reference Range 11/29/12    ALT  Date Value Ref Range Status  11/22/2021 25 0 - 44 U/L Final   SGPT (ALT)  Date Value Ref Range Status  03/08/2013 29 12 - 78 U/L Final   AST  Date Value Ref Range Status  11/22/2021 20 15 - 41 U/L Final   SGOT(AST)  Date Value Ref Range Status  03/08/2013 23 15 - 37 Unit/L Final   BUN  Date Value Ref Range Status  07/17/2022 7 6 - 20 mg/dL Final  95/28/4132 7 6 - 20 mg/dL Final  44/01/270 4 (L) 7 - 18 mg/dL Final   Calcium  Date Value Ref Range Status  07/17/2022 8.8 (L) 8.9 - 10.3 mg/dL Final   Calcium, Total  Date Value Ref Range Status  03/08/2013 8.6 8.5 - 10.1 mg/dL Final   CO2  Date Value Ref Range Status  07/17/2022 25 22 - 32 mmol/L Final   Co2  Date Value Ref Range Status  03/08/2013 25 21 - 32 mmol/L Final   Creatinine  Date Value Ref Range Status  03/08/2013 0.70 0.60 - 1.30 mg/dL Final   Creatinine, Ser  Date Value Ref Range Status  07/17/2022 0.79 0.44 - 1.00 mg/dL Final   Creatinine, Urine  Date Value Ref Range Status  06/08/2016 250 mg/dL Final   Glucose  Date Value Ref Range Status  03/08/2013 89 65 - 99 mg/dL Final   Glucose, Bld  Date Value Ref Range Status  07/17/2022 93  70 - 99 mg/dL Final    Comment:    Glucose reference range applies only to samples taken after fasting for at least 8 hours.   Glucose-Capillary  Date Value Ref Range Status  02/04/2021 96 70 - 99 mg/dL Final    Comment:    Glucose reference range applies only to samples taken after fasting for at least 8 hours.   Potassium  Date Value Ref Range Status  07/17/2022 4.0 3.5 - 5.1 mmol/L Final  03/08/2013 3.4 (L) 3.5 - 5.1 mmol/L Final   Sodium  Date Value Ref Range Status  07/17/2022 137 135 - 145 mmol/L Final  11/11/2021 140 134 - 144 mmol/L Final  03/08/2013 136 136 - 145 mmol/L Final   Total Bilirubin  Date Value Ref Range Status  11/22/2021 0.9 0.3 - 1.2 mg/dL Final   Bilirubin,Total  Date Value Ref Range Status  03/08/2013 0.5 0.2 - 1.0 mg/dL Final   Bilirubin Total  Date Value Ref Range Status  11/11/2021 1.1 0.0 - 1.2 mg/dL Final   Protein, ur  Date Value Ref Range Status  12/24/2018 NEGATIVE NEGATIVE mg/dL Final   Protein,UA  Date Value Ref Range Status  11/11/2021 Trace (A) Negative/Trace Final   Total Protein, Urine  Date Value Ref Range Status  06/08/2016 25 mg/dL Final    Comment:    NO NORMAL RANGE ESTABLISHED FOR THIS TEST   Total Protein  Date Value Ref Range Status  11/22/2021 6.9 6.5 - 8.1 g/dL Final  16/10/9602 6.9 6.0 - 8.5 g/dL Final  54/09/8117 7.5 6.4 - 8.2 g/dL Final   EGFR (African American)  Date Value Ref Range Status  03/08/2013 >60  Final   GFR calc Af Amer  Date Value Ref Range Status  02/10/2019 110 >59 mL/min/1.73 Final   eGFR  Date Value Ref Range Status  11/11/2021 93 >59 mL/min/1.73 Final   EGFR (Non-African Amer.)  Date Value Ref Range Status  03/08/2013 >60  Final    Comment:    eGFR values <51mL/min/1.73 m2 may be an indication of chronic kidney disease (CKD). Calculated eGFR is useful in patients with stable renal function. The eGFR calculation will not be reliable in acutely ill patients when serum  creatinine is changing rapidly. It is not useful in  patients on dialysis. The eGFR calculation may not be applicable to patients at the low and high extremes of body sizes, pregnant women, and vegetarians.    GFR, Estimated  Date Value Ref Range Status  07/17/2022 >60 >60 mL/min Final    Comment:    (NOTE) Calculated using the CKD-EPI Creatinine Equation (2021)

## 2022-09-05 ENCOUNTER — Telehealth: Payer: Self-pay

## 2022-09-05 NOTE — Telephone Encounter (Signed)
Patient is overdue for a follow up. Please call and schedule visit and then route to provider for refill.

## 2022-09-05 NOTE — Telephone Encounter (Signed)
Transition Care Management Unsuccessful Follow-up Telephone Call  Date of discharge and from where:  [place] 8/20  Attempts:  1st Attempt  Reason for unsuccessful TCM follow-up call:  No answer/busy   Holly Nicholson  [facility], [facility] Guide, Phone: [phone] Website: [email or web address]

## 2022-09-06 ENCOUNTER — Telehealth: Payer: Self-pay

## 2022-09-06 NOTE — Telephone Encounter (Signed)
Transition Care Management Unsuccessful Follow-up Telephone Call  Date of discharge and from where:  [facility] 8/20  Attempts:  2nd Attempt  Reason for unsuccessful TCM follow-up call:  No answer/busy   Lenard Forth [place]  [facility], [facility] Guide, Phone: [phone] Website: [email or web address]

## 2022-09-06 NOTE — Telephone Encounter (Signed)
Called and LVM to call office back to schedule follow up appointment.  Put in CRM.

## 2022-09-07 NOTE — Telephone Encounter (Signed)
Scheduled patient on 10/19/2022 @ 3:20 pm.

## 2022-09-10 DIAGNOSIS — Z419 Encounter for procedure for purposes other than remedying health state, unspecified: Secondary | ICD-10-CM | POA: Diagnosis not present

## 2022-09-14 ENCOUNTER — Encounter: Payer: Self-pay | Admitting: Cardiology

## 2022-10-03 ENCOUNTER — Other Ambulatory Visit: Payer: Self-pay

## 2022-10-03 MED ORDER — NADOLOL 40 MG PO TABS: 40.0000 mg | ORAL_TABLET | Freq: Every day | ORAL | 3 refills | Status: DC

## 2022-10-10 DIAGNOSIS — Z419 Encounter for procedure for purposes other than remedying health state, unspecified: Secondary | ICD-10-CM | POA: Diagnosis not present

## 2022-10-19 ENCOUNTER — Ambulatory Visit: Payer: Medicaid Other | Admitting: Family Medicine

## 2022-11-10 DIAGNOSIS — Z419 Encounter for procedure for purposes other than remedying health state, unspecified: Secondary | ICD-10-CM | POA: Diagnosis not present

## 2022-12-08 ENCOUNTER — Telehealth: Payer: Self-pay | Admitting: Pharmacy Technician

## 2022-12-08 ENCOUNTER — Other Ambulatory Visit (HOSPITAL_COMMUNITY): Payer: Self-pay

## 2022-12-08 NOTE — Telephone Encounter (Signed)
Pharmacy Patient Advocate Encounter   Received notification from CoverMyMeds that prior authorization for nadolol is required/requested.   Insurance verification completed.   The patient is insured through [employer] .   Per test claim: The current 12/08/22 day co-pay is, $2.87- one month.  No PA needed at this time. This test claim was processed through [facility]- copay amounts may vary at other pharmacies due to pharmacy/plan contracts, or as the patient moves through the different stages of their insurance plan.

## 2022-12-10 DIAGNOSIS — Z419 Encounter for procedure for purposes other than remedying health state, unspecified: Secondary | ICD-10-CM | POA: Diagnosis not present

## 2022-12-19 ENCOUNTER — Other Ambulatory Visit: Payer: Self-pay

## 2022-12-19 ENCOUNTER — Emergency Department: Payer: Medicaid Other

## 2022-12-19 ENCOUNTER — Emergency Department
Admission: EM | Admit: 2022-12-19 | Discharge: 2022-12-19 | Disposition: A | Discharge status: Home / Self Care | Payer: Medicaid Other | Attending: Emergency Medicine | Admitting: Emergency Medicine

## 2022-12-19 DIAGNOSIS — R079 Chest pain, unspecified: Secondary | ICD-10-CM

## 2022-12-19 DIAGNOSIS — R0789 Other chest pain: Secondary | ICD-10-CM | POA: Insufficient documentation

## 2022-12-19 DIAGNOSIS — I10 Essential (primary) hypertension: Secondary | ICD-10-CM | POA: Insufficient documentation

## 2022-12-19 DIAGNOSIS — R0602 Shortness of breath: Secondary | ICD-10-CM | POA: Diagnosis not present

## 2022-12-19 LAB — CBC
HCT: 41.2 % (ref 36.0–46.0)
Hemoglobin: 14.4 g/dL (ref 12.0–15.0)
MCH: 31.4 pg (ref 26.0–34.0)
MCHC: 35 g/dL (ref 30.0–36.0)
MCV: 89.8 fL (ref 80.0–100.0)
Platelets: 181 10*3/uL (ref 150–400)
RBC: 4.59 MIL/uL (ref 3.87–5.11)
RDW: 12.8 % (ref 11.5–15.5)
WBC: 5.9 10*3/uL (ref 4.0–10.5)
nRBC: 0 % (ref 0.0–0.2)

## 2022-12-19 LAB — BASIC METABOLIC PANEL
Anion gap: 7 (ref 5–15)
BUN: 8 mg/dL (ref 6–20)
CO2: 27 mmol/L (ref 22–32)
Calcium: 8.9 mg/dL (ref 8.9–10.3)
Chloride: 102 mmol/L (ref 98–111)
Creatinine, Ser: 0.84 mg/dL (ref 0.44–1.00)
GFR, Estimated: >60 mL/min (ref >60)
Glucose, Bld: 129 mg/dL — ABNORMAL HIGH (ref 70–99)
Potassium: 3.7 mmol/L (ref 3.5–5.1)
Sodium: 136 mmol/L (ref 135–145)

## 2022-12-19 LAB — TROPONIN I (HIGH SENSITIVITY)
Troponin I (High Sensitivity): 4 ng/L (ref <18)
Troponin I (High Sensitivity): 3 ng/L (ref <18)

## 2022-12-19 NOTE — Discharge Instructions (Signed)
You were seen in the Emergency Department today for evaluation of your chest pain. Fortunately, your labs, EKG, and chest x-Holly Nicholson were overall reassuring against a emergency cause for your pain. Please follow-up with your primary doctor or cardiologist closely for reevaluation. Return to the ER for any new or worsening symptoms including worsening chest pain, difficulty breathing, or any other new or concerning symptoms that you believe warrants immediate attention.

## 2022-12-19 NOTE — ED Triage Notes (Signed)
Pt here with cp. Pt states she has not been taking her heart meds for a week. Pt states from her ribs up to her neck is in pain. Pt states the pain is intermittent and has some SOB and back pain as well. Pt denies NVD.

## 2022-12-19 NOTE — ED Provider Notes (Signed)
[facility] Provider Note    Event Date/Time   First MD Initiated Contact with Patient 12/19/22 [phone]     (approximate)   History   Chest Pain   HPI  MALAKA RIZVI is a 37 year old female with history of HTN, QTc prolongation, atypical chest pain, anxiety presenting to the emergency department for evaluation of chest pain.  Patient reports that for the last few weeks she has had some intermittent chest discomfort described as a tightness in her chest.  Reports that this does not stop her from her daily activities, but it was persistent leading her to present to the ER today.  Does report occasional shortness of breath.  I did review her outpatient cardiology visit from July.  At that time, patient's QTc prolongation had resolved, she reported lower extremity edema, outpatient echo was ordered.  Had labs performed at that time with normal BNP, normal A1c, BMP without critical findings.  She did have an echo performed in January 2023 demonstrating an EF of 55 to 60%.  I also reviewed her CT coronary from September 2023 which had a coronary calcium score of 0.       Physical Exam   Triage Vital Signs: ED Triage Vitals  Encounter Vitals Group     BP 12/19/22 0850 (!) 158/93     Systolic BP Percentile --      Diastolic BP Percentile --      Pulse Rate 12/19/22 0850 65     Resp 12/19/22 0850 17     Temp 12/19/22 0850 98.1 F (36.7 C)     Temp Source 12/19/22 0850 Oral     SpO2 12/19/22 0850 100 %     Weight 12/19/22 0849 257 lb 0.9 oz (116.6 kg)     Height 12/19/22 0849 5' (1.524 m)     Head Circumference --      Peak Flow --      Pain Score 12/19/22 0849 8     Pain Loc --      Pain Education --      Exclude from Growth Chart --     Most recent vital signs: Vitals:   12/19/22 1200 12/19/22 1230  BP: 116/72 114/63  Pulse: (!) 58 (!) 59  Resp: 13 18  Temp:    SpO2: 96% 98%     General: Awake, interactive  CV:  Regular rate, good peripheral  perfusion.  Resp:  Unlabored respirations, lungs clear to auscultation Abd:  Nondistended, soft, nontender Neuro:  Symmetric facial movement, fluid speech   ED Results / Procedures / Treatments   Labs (all labs ordered are listed, but only abnormal results are displayed) Labs Reviewed  BASIC METABOLIC PANEL - Abnormal; Notable for the following components:      Result Value   Glucose, Bld 129 (*)    All other components within normal limits  CBC  TROPONIN I (HIGH SENSITIVITY)  TROPONIN I (HIGH SENSITIVITY)     EKG EKG independently reviewed interpreted by myself (ER attending) demonstrates:  EKG demonstrates normal sinus rhythm at a rate of 62, PR 150, QRS 84, QTc 399, no acute ST changes  RADIOLOGY Imaging independently reviewed and interpreted by myself demonstrates:  CXR without focal consolidation  PROCEDURES:  Critical Care performed: No  Procedures   MEDICATIONS ORDERED IN ED: Medications - No data to display   IMPRESSION / MDM / ASSESSMENT AND PLAN / ED COURSE  I reviewed the triage vital signs and the nursing  notes.  Differential diagnosis includes, but is not limited to, anemia, electrolyte abnormality, pneumonia, pneumothorax, low risk PE and PERC negative, less lower suspicion ACS with prior cardiac workup  Patient's presentation is most consistent with acute presentation with potential threat to life or bodily function.  37 year old female presenting to the emerged department for evaluation of chest pain.  Vital stable on presentation.  Labs reassuring including troponin x 2.  EKG without acute ischemic findings.  X-Theodis Kinsel reassuring.  Patient updated on results of workup.  Does feel improved on reevaluation.  She is comfortable with discharge with outpatient follow-up.  Strict return precautions provided.     FINAL CLINICAL IMPRESSION(S) / ED DIAGNOSES   Final diagnoses:  Nonspecific chest pain     Rx / DC Orders   ED Discharge Orders     None         Note:  This document was prepared using Dragon voice recognition software and may include unintentional dictation errors.   Trinna Post, MD 12/19/22 [phone]

## 2022-12-28 ENCOUNTER — Other Ambulatory Visit: Payer: Self-pay | Admitting: Family Medicine

## 2022-12-28 ENCOUNTER — Other Ambulatory Visit: Payer: Self-pay | Admitting: Internal Medicine

## 2022-12-28 ENCOUNTER — Other Ambulatory Visit: Payer: Self-pay | Admitting: Nurse Practitioner

## 2022-12-28 NOTE — Telephone Encounter (Signed)
Requested medications are due for refill today.  yes  Requested medications are on the active medications list.  yes  Last refill. 12/09/2021 #90 1rf  Future visit scheduled.   no  Notes to clinic.  Labs are expired. Pt needs an OV.    Requested Prescriptions  Pending Prescriptions Disp Refills   atorvastatin (LIPITOR) 10 MG tablet [Pharmacy Med Name: ATORVASTATIN 10 MG TABLET] 90 tablet 1    Sig: TAKE 1 TABLET BY MOUTH EVERY DAY     Cardiovascular:  Antilipid - Statins Failed - 12/28/2022  4:17 PM      Failed - Valid encounter within last 12 months    Recent Outpatient Visits           1 year ago Bipolar affective disorder, currently depressed, moderate (HCC)   [place] [facility] [place], Megan P, DO   1 year ago Fatigue, unspecified type   [place] [facility] [place], Megan P, DO   3 years ago Essential hypertension   [facility] [place], [place]   3 years ago Essential hypertension   [place] [facility] Roosvelt Maser [place], [place]   3 years ago Essential hypertension   [facility] Roosvelt Maser [place], [place]              Failed - Lipid Panel in normal range within the last 12 months    Cholesterol, Total  Date Value Ref Range Status  11/11/2021 144 100 - 199 mg/dL Final   LDL Chol Calc (NIH)  Date Value Ref Range Status  11/11/2021 69 0 - 99 mg/dL Final   HDL  Date Value Ref Range Status  11/11/2021 34 (L) >39 mg/dL Final   Triglycerides  Date Value Ref Range Status  11/11/2021 249 (H) 0 - 149 mg/dL Final         Passed - Patient is not pregnant

## 2022-12-28 NOTE — Telephone Encounter (Signed)
Requested medication (s) are due for refill today: Yes  Requested medication (s) are on the active medication list: Yes  Last refill:  09/08/22 #90, 1 RF  Future visit scheduled: No  Notes to clinic:  Unable to refill per protocol, cannot delegate. Unable to refill per protocol, appointment needed.      Requested Prescriptions  Pending Prescriptions Disp Refills   QUEtiapine (SEROQUEL) 25 MG tablet [Pharmacy Med Name: QUETIAPINE FUMARATE 25 MG TAB] 90 tablet 2    Sig: TAKE 1 TABLET BY MOUTH EVERYDAY AT BEDTIME     Not Delegated - Psychiatry:  Antipsychotics - Second Generation (Atypical) - quetiapine Failed - 12/28/2022  4:28 PM      Failed - This refill cannot be delegated      Failed - TSH in normal range and within 360 days    TSH  Date Value Ref Range Status  11/11/2021 1.080 0.450 - 4.500 uIU/mL Final         Failed - Completed PHQ-2 or PHQ-9 in the last 360 days      Failed - Valid encounter within last 6 months    Recent Outpatient Visits           1 year ago Bipolar affective disorder, currently depressed, moderate (HCC)   [facility] [place], Megan P, DO   1 year ago Fatigue, unspecified type   [facility] [place], Megan P, DO   3 years ago Essential hypertension   [facility] [place] Maser [place], [place]   3 years ago Essential hypertension   [facility] [place] Maser [place], [place]   3 years ago Essential hypertension   [facility] [place] Maser [place], [place]              Failed - Lipid Panel in normal range within the last 12 months    Cholesterol, Total  Date Value Ref Range Status  11/11/2021 144 100 - 199 mg/dL Final   LDL Chol Calc (NIH)  Date Value Ref Range Status  11/11/2021 69 0 - 99 mg/dL Final   HDL  Date Value Ref Range Status  11/11/2021 34 (L) >39 mg/dL Final   Triglycerides  Date Value Ref Range Status   11/11/2021 249 (H) 0 - 149 mg/dL Final         Failed - CBC within normal limits and completed in the last 12 months    WBC  Date Value Ref Range Status  12/19/2022 5.9 4.0 - 10.5 K/uL Final   RBC  Date Value Ref Range Status  12/19/2022 4.59 3.87 - 5.11 MIL/uL Final   Hemoglobin  Date Value Ref Range Status  12/19/2022 14.4 12.0 - 15.0 g/dL Final  95/62/1308 65.7 11.1 - 15.9 g/dL Final  84/69/6295 28.4 g/dL Final   HCT  Date Value Ref Range Status  12/19/2022 41.2 36.0 - 46.0 % Final  10/21/2015 33 % Final   Hematocrit  Date Value Ref Range Status  11/11/2021 36.3 34.0 - 46.6 % Final   MCHC  Date Value Ref Range Status  12/19/2022 35.0 30.0 - 36.0 g/dL Final   [facility]  Date Value Ref Range Status  12/19/2022 31.4 26.0 - 34.0 pg Final   MCV  Date Value Ref Range Status  12/19/2022 89.8 80.0 - 100.0 fL Final  11/11/2021 89 79 - 97 fL Final  09/29/2013 87 80 - 100 fL Final   No results found for: "  PLTCOUNTKUC", "LABPLAT", "POCPLA" RDW  Date Value Ref Range Status  12/19/2022 12.8 11.5 - 15.5 % Final  11/11/2021 13.2 11.7 - 15.4 % Final  09/29/2013 14.1 11.5 - 14.5 % Final         Failed - CMP within normal limits and completed in the last 12 months    Albumin  Date Value Ref Range Status  11/22/2021 3.9 3.5 - 5.0 g/dL Final  54/09/8117 4.6 3.9 - 4.9 g/dL Final  14/78/2956 3.7 3.4 - 5.0 g/dL Final   Alkaline Phosphatase  Date Value Ref Range Status  11/22/2021 83 38 - 126 U/L Final  03/08/2013 90 Unit/L Final    Comment:    45-117 NOTE: New Reference Range 11/29/12    ALT  Date Value Ref Range Status  11/22/2021 25 0 - 44 U/L Final   SGPT (ALT)  Date Value Ref Range Status  03/08/2013 29 12 - 78 U/L Final   AST  Date Value Ref Range Status  11/22/2021 20 15 - 41 U/L Final   SGOT(AST)  Date Value Ref Range Status  03/08/2013 23 15 - 37 Unit/L Final   BUN  Date Value Ref Range Status  12/19/2022 8 6 - 20 mg/dL Final  21/30/8657 7 6 - 20  mg/dL Final  84/69/6295 4 (L) 7 - 18 mg/dL Final   Calcium  Date Value Ref Range Status  12/19/2022 8.9 8.9 - 10.3 mg/dL Final   Calcium, Total  Date Value Ref Range Status  03/08/2013 8.6 8.5 - 10.1 mg/dL Final   CO2  Date Value Ref Range Status  12/19/2022 27 22 - 32 mmol/L Final   Co2  Date Value Ref Range Status  03/08/2013 25 21 - 32 mmol/L Final   Creatinine  Date Value Ref Range Status  03/08/2013 0.70 0.60 - 1.30 mg/dL Final   Creatinine, Ser  Date Value Ref Range Status  12/19/2022 0.84 0.44 - 1.00 mg/dL Final   Creatinine, Urine  Date Value Ref Range Status  06/08/2016 250 mg/dL Final   Glucose  Date Value Ref Range Status  03/08/2013 89 65 - 99 mg/dL Final   Glucose, Bld  Date Value Ref Range Status  12/19/2022 129 (H) 70 - 99 mg/dL Final    Comment:    Glucose reference range applies only to samples taken after fasting for at least 8 hours.   Glucose-Capillary  Date Value Ref Range Status  02/04/2021 96 70 - 99 mg/dL Final    Comment:    Glucose reference range applies only to samples taken after fasting for at least 8 hours.   Potassium  Date Value Ref Range Status  12/19/2022 3.7 3.5 - 5.1 mmol/L Final  03/08/2013 3.4 (L) 3.5 - 5.1 mmol/L Final   Sodium  Date Value Ref Range Status  12/19/2022 136 135 - 145 mmol/L Final  11/11/2021 140 134 - 144 mmol/L Final  03/08/2013 136 136 - 145 mmol/L Final   Total Bilirubin  Date Value Ref Range Status  11/22/2021 0.9 0.3 - 1.2 mg/dL Final   Bilirubin,Total  Date Value Ref Range Status  03/08/2013 0.5 0.2 - 1.0 mg/dL Final   Bilirubin Total  Date Value Ref Range Status  11/11/2021 1.1 0.0 - 1.2 mg/dL Final   Protein, ur  Date Value Ref Range Status  12/24/2018 NEGATIVE NEGATIVE mg/dL Final   Protein,UA  Date Value Ref Range Status  11/11/2021 Trace (A) Negative/Trace Final   Total Protein, Urine  Date Value Ref Range Status  06/08/2016 25 mg/dL Final    Comment:    NO NORMAL RANGE  ESTABLISHED FOR THIS TEST   Total Protein  Date Value Ref Range Status  11/22/2021 6.9 6.5 - 8.1 g/dL Final  66/44/0347 6.9 6.0 - 8.5 g/dL Final  42/59/5638 7.5 6.4 - 8.2 g/dL Final   EGFR (African American)  Date Value Ref Range Status  03/08/2013 >60  Final   GFR calc Af Amer  Date Value Ref Range Status  02/10/2019 110 >59 mL/min/1.73 Final   eGFR  Date Value Ref Range Status  11/11/2021 93 >59 mL/min/1.73 Final   EGFR (Non-African Amer.)  Date Value Ref Range Status  03/08/2013 >60  Final    Comment:    eGFR values <72mL/min/1.73 m2 may be an indication of chronic kidney disease (CKD). Calculated eGFR is useful in patients with stable renal function. The eGFR calculation will not be reliable in acutely ill patients when serum creatinine is changing rapidly. It is not useful in  patients on dialysis. The eGFR calculation may not be applicable to patients at the low and high extremes of body sizes, pregnant women, and vegetarians.    GFR, Estimated  Date Value Ref Range Status  12/19/2022 >60 >60 mL/min Final    Comment:    (NOTE) Calculated using the CKD-EPI Creatinine Equation (2021)          Passed - Last BP in normal range    BP Readings from Last 1 Encounters:  12/19/22 114/63         Passed - Last Heart Rate in normal range    Pulse Readings from Last 1 Encounters:  12/19/22 (!) 59

## 2022-12-29 NOTE — Telephone Encounter (Signed)
Has not been seen in over a year. Needs appointment.

## 2022-12-29 NOTE — Telephone Encounter (Signed)
Patient is overdue for an appointment. Please call to schedule and then route to provider for refill.  

## 2022-12-29 NOTE — Telephone Encounter (Signed)
Attempted to reach patient, LVM to call office back explained that she would need an appointment considering she has not been seen in over a year in order to get refills on her medications.  Will put in CRM.

## 2022-12-29 NOTE — Telephone Encounter (Signed)
Please have your staff schedule this pt. She is not a [facility] pt. Thank you

## 2023-01-10 DIAGNOSIS — Z419 Encounter for procedure for purposes other than remedying health state, unspecified: Secondary | ICD-10-CM | POA: Diagnosis not present

## 2023-02-05 ENCOUNTER — Ambulatory Visit: Payer: Medicaid Other | Admitting: Family Medicine

## 2023-02-10 DIAGNOSIS — Z419 Encounter for procedure for purposes other than remedying health state, unspecified: Secondary | ICD-10-CM | POA: Diagnosis not present

## 2023-03-09 ENCOUNTER — Encounter: Payer: Self-pay | Admitting: Family Medicine

## 2023-03-09 ENCOUNTER — Ambulatory Visit: Payer: Medicaid Other | Admitting: Family Medicine

## 2023-03-09 VITALS — BP 106/71 | HR 62 | Temp 98.7°F | Resp 17 | Ht 63.39 in | Wt 252.6 lb

## 2023-03-09 DIAGNOSIS — J101 Influenza due to other identified influenza virus with other respiratory manifestations: Secondary | ICD-10-CM | POA: Diagnosis not present

## 2023-03-09 DIAGNOSIS — R6889 Other general symptoms and signs: Secondary | ICD-10-CM | POA: Diagnosis not present

## 2023-03-09 DIAGNOSIS — F3132 Bipolar disorder, current episode depressed, moderate: Secondary | ICD-10-CM | POA: Diagnosis not present

## 2023-03-09 DIAGNOSIS — R9431 Abnormal electrocardiogram [ECG] [EKG]: Secondary | ICD-10-CM

## 2023-03-09 DIAGNOSIS — Z7289 Other problems related to lifestyle: Secondary | ICD-10-CM | POA: Diagnosis not present

## 2023-03-09 MED ORDER — LURASIDONE HCL 20 MG PO TABS: 20.0000 mg | ORAL_TABLET | Freq: Every day | ORAL | 1 refills | Status: DC

## 2023-03-09 MED ORDER — ATORVASTATIN CALCIUM 10 MG PO TABS: 10.0000 mg | ORAL_TABLET | Freq: Every day | ORAL | 1 refills | Status: DC

## 2023-03-09 MED ORDER — PANTOPRAZOLE SODIUM 40 MG PO TBEC: 40.0000 mg | DELAYED_RELEASE_TABLET | Freq: Every day | ORAL | 1 refills | Status: DC

## 2023-03-09 MED ORDER — OSELTAMIVIR PHOSPHATE 75 MG PO CAPS: 75.0000 mg | ORAL_CAPSULE | Freq: Two times a day (BID) | ORAL | 0 refills | Status: DC

## 2023-03-09 MED ORDER — ALBUTEROL SULFATE HFA 108 (90 BASE) MCG/ACT IN AERS: 2.0000 | INHALATION_SPRAY | Freq: Four times a day (QID) | RESPIRATORY_TRACT | 0 refills | Status: DC | PRN

## 2023-03-09 NOTE — Progress Notes (Signed)
 BP 106/71 (BP Location: Left Arm, Patient Position: Sitting, Cuff Size: Large)   Pulse 62   Temp 98.7 F (37.1 C) (Oral)   Resp 17   Ht 5' 3.39" (1.61 m)   Wt 252 lb 9.6 oz (114.6 kg)   LMP 08/13/2017 (Exact Date)   SpO2 96%   BMI 44.20 kg/m    Subjective:    Patient ID: Holly Nicholson, female    DOB: [birth year], 38 y.o.   MRN: [id number]  HPI: Holly Nicholson is a 38 y.o. female who presents today after being lost to follow up for 15+ months.   Chief Complaint  Patient presents with   Depression    Lost job last year, lost insurance. Since has struggled and unable to fill most meds due to that.    grief    Nephew past week after battle with staph infection that caused him to be paralyized   Has been to ER 3x in the last year for chest pain. Diagnosed with QT prolongation. She was following with Dr. Graciela Husbands, but has not followed up with him as scheduled in January. She was told to avoid QT prolongating medicines   DEPRESSION- has been off her seroquel for a couple of days. She has been taking less since  Mood status: exacerbated Satisfied with current treatment?: no Symptom severity: severe  Duration of current treatment : chronic Side effects: no Medication compliance: good compliance Psychotherapy/counseling: no  Previous psychiatric medications: seroquel Depressed mood: yes Anxious mood: yes Anhedonia: no Significant weight loss or gain: no Insomnia: yes  Fatigue: yes Feelings of worthlessness or guilt: yes Impaired concentration/indecisiveness: yes Suicidal ideations: yes Hopelessness: yes Crying spells: yes    03/09/2023    3:25 PM 12/09/2021    4:55 PM 11/11/2021    3:50 PM 02/10/2019    4:55 PM 07/10/2017   10:05 AM  Depression screen PHQ 2/9  Decreased Interest 3 1 1 1 2   Down, Depressed, Hopeless 3 1 1 2 2   PHQ - 2 Score 6 2 2 3 4   Altered sleeping 3 1 0 3 3  Tired, decreased energy 3 1 1 2 3   Change in appetite 3 3 3 3 3   Feeling bad or failure about  yourself  2 1 1 2 3   Trouble concentrating 2 3 3 3 3   Moving slowly or fidgety/restless 2 2 3 2 3   Suicidal thoughts 1 0 0 0 0  PHQ-9 Score 22 13 13 18 22   Difficult doing work/chores  Somewhat difficult       UPPER RESPIRATORY TRACT INFECTION Duration: 1 day Worst symptom: congestion Fever: yes Cough: yes Shortness of breath: yes Wheezing: no Chest pain: no Chest tightness: no Chest congestion: no Nasal congestion: yes Runny nose: no Post nasal drip: no Sneezing: no Sore throat: yes Swollen glands: no Sinus pressure: yes Headache: yes Face pain: no Toothache: no Ear pain: no  Ear pressure: no  Eyes red/itching:no Eye drainage/crusting: no  Vomiting: no Rash: no Fatigue: yes Sick contacts: yes Strep contacts: no  Context: stable Recurrent sinusitis: no Relief with OTC cold/cough medications: no  Treatments attempted: none    Relevant past medical, surgical, family and social history reviewed and updated as indicated. Interim medical history since our last visit reviewed. Allergies and medications reviewed and updated.  Review of Systems  Constitutional: Negative.   Respiratory: Negative.    Cardiovascular: Negative.   Musculoskeletal: Negative.   Psychiatric/Behavioral:  Positive for agitation, dysphoric mood, self-injury  and suicidal ideas. Negative for behavioral problems, confusion, decreased concentration, hallucinations and sleep disturbance. The patient is nervous/anxious. The patient is not hyperactive.     Per HPI unless specifically indicated above     Objective:    BP 106/71 (BP Location: Left Arm, Patient Position: Sitting, Cuff Size: Large)   Pulse 62   Temp 98.7 F (37.1 C) (Oral)   Resp 17   Ht 5' 3.39" (1.61 m)   Wt 252 lb 9.6 oz (114.6 kg)   LMP 08/13/2017 (Exact Date)   SpO2 96%   BMI 44.20 kg/m   Wt Readings from Last 3 Encounters:  03/09/23 252 lb 9.6 oz (114.6 kg)  12/19/22 257 lb 0.9 oz (116.6 kg)  08/29/22 257 lb (116.6  kg)    Physical Exam Vitals and nursing note reviewed.  Constitutional:      General: She is not in acute distress.    Appearance: Normal appearance. She is not ill-appearing, toxic-appearing or diaphoretic.  HENT:     Head: Normocephalic and atraumatic.     Right Ear: External ear normal.     Left Ear: External ear normal.     Nose: Nose normal.     Mouth/Throat:     Mouth: Mucous membranes are moist.     Pharynx: Oropharynx is clear.  Eyes:     General: No scleral icterus.       Right eye: No discharge.        Left eye: No discharge.     Extraocular Movements: Extraocular movements intact.     Conjunctiva/sclera: Conjunctivae normal.     Pupils: Pupils are equal, round, and reactive to light.  Cardiovascular:     Rate and Rhythm: Normal rate and regular rhythm.     Pulses: Normal pulses.     Heart sounds: Normal heart sounds. No murmur heard.    No friction rub. No gallop.  Pulmonary:     Effort: Pulmonary effort is normal. No respiratory distress.     Breath sounds: Normal breath sounds. No stridor. No wheezing, rhonchi or rales.  Chest:     Chest wall: No tenderness.  Musculoskeletal:        General: Normal range of motion.     Cervical back: Normal range of motion and neck supple.  Skin:    General: Skin is warm and dry.     Capillary Refill: Capillary refill takes less than 2 seconds.     Coloration: Skin is not jaundiced or pale.     Findings: No bruising, erythema, lesion or rash.  Neurological:     General: No focal deficit present.     Mental Status: She is alert and oriented to person, place, and time. Mental status is at baseline.  Psychiatric:        Mood and Affect: Mood normal.        Behavior: Behavior normal.        Thought Content: Thought content normal.        Judgment: Judgment normal.     Results for orders placed or performed during the hospital encounter of 12/19/22  Basic metabolic panel   Collection Time: 12/19/22  8:51 AM  Result Value  Ref Range   Sodium 136 135 - 145 mmol/L   Potassium 3.7 3.5 - 5.1 mmol/L   Chloride 102 98 - 111 mmol/L   CO2 27 22 - 32 mmol/L   Glucose, Bld 129 (H) 70 - 99 mg/dL   BUN 8 6 - 20  mg/dL   Creatinine, Ser 1.61 0.44 - 1.00 mg/dL   Calcium 8.9 8.9 - 09.6 mg/dL   GFR, Estimated >04 >54 mL/min   Anion gap 7 5 - 15  CBC   Collection Time: 12/19/22  8:51 AM  Result Value Ref Range   WBC 5.9 4.0 - 10.5 K/uL   RBC 4.59 3.87 - 5.11 MIL/uL   Hemoglobin 14.4 12.0 - 15.0 g/dL   HCT 09.8 11.9 - 14.7 %   MCV 89.8 80.0 - 100.0 fL   MCH 31.4 26.0 - 34.0 pg   MCHC 35.0 30.0 - 36.0 g/dL   RDW 82.9 56.2 - 13.0 %   Platelets 181 150 - 400 K/uL   nRBC 0.0 0.0 - 0.2 %  Troponin I (High Sensitivity)   Collection Time: 12/19/22  8:51 AM  Result Value Ref Range   Troponin I (High Sensitivity) 4 <18 ng/L  Troponin I (High Sensitivity)   Collection Time: 12/19/22 11:15 AM  Result Value Ref Range   Troponin I (High Sensitivity) 3 <18 ng/L      Assessment & Plan:   Problem List Items Addressed This Visit       Other   Bipolar affective disorder, currently depressed, moderate (HCC)   Not doing well. Will avoid seroquel due to QT prolongation. Will start her on latuda and refer to counseling and psychiatry. Continue close follow up. Follow up 2-3 weeks.       Relevant Orders   AMB Referral [facility]I Care Management   Ambulatory referral to Psychiatry   QT prolongation   Will get her back into cardiology. Avoid QT prolongating agents. Continue to monitor.       Relevant Orders   Ambulatory referral to Cardiology   Ambulatory referral to Psychiatry   Other Visit Diagnoses       Influenza A    -  Primary   Will treat with tamiflu. Call with any concerns.   Relevant Medications   oseltamivir (TAMIFLU) 75 MG capsule     Deliberate self-cutting       Referral to social work and psychiatry placed today.   Relevant Orders   AMB Referral VBCI Care Management   Ambulatory referral to Psychiatry      Flu-like symptoms       + flu A   Relevant Orders   Novel Coronavirus, NAA ([facility])   Veritor Flu A/B Waived        Follow up plan: Return in about 2 weeks (around 03/23/2023) for physical.

## 2023-03-10 DIAGNOSIS — Z419 Encounter for procedure for purposes other than remedying health state, unspecified: Secondary | ICD-10-CM | POA: Diagnosis not present

## 2023-03-12 ENCOUNTER — Encounter: Payer: Self-pay | Admitting: Family Medicine

## 2023-03-12 NOTE — Assessment & Plan Note (Signed)
 Not doing well. Will avoid seroquel due to QT prolongation. Will start her on latuda and refer to counseling and psychiatry. Continue close follow up. Follow up 2-3 weeks.

## 2023-03-12 NOTE — Assessment & Plan Note (Signed)
 Will get her back into cardiology. Avoid QT prolongating agents. Continue to monitor.

## 2023-03-13 ENCOUNTER — Ambulatory Visit: Payer: Medicaid Other | Admitting: Family Medicine

## 2023-03-13 ENCOUNTER — Telehealth: Payer: Self-pay

## 2023-03-13 LAB — VERITOR FLU A/B WAIVED
Influenza A: POSITIVE — AB
Influenza B: NEGATIVE

## 2023-03-13 NOTE — Progress Notes (Signed)
 Complex Care Management Note  Care Guide Note 03/13/2023 Name: Holly Nicholson MRN: [id number] DOB: May 09, 1985  Holly Nicholson is a 38 y.o. year old female who sees Swati, [place], DO for primary care. I reached out to [name] by phone today to offer complex care management services.  [name] was given information about Complex Care Management services today including:   The Complex Care Management services include support from the care team which includes your Nurse Care Manager, Clinical Social Worker, or Pharmacist.  The Complex Care Management team is here to help remove barriers to the health concerns and goals most important to you. Complex Care Management services are voluntary, and the patient may decline or stop services at any time by request to their care team member.   Complex Care Management Consent Status: Patient wishes to consider information provided and/or speak with a member of the care team before deciding to participate in complex care management services.   Follow up plan:  Telephone appointment with complex care management team member scheduled for:  03/26/23 at 9:00 a.m.   Encounter Outcome:  Patient Scheduled  [facility]  [facility] Care Management Assistant Direct Dial: [phone]  Fax: [phone]

## 2023-03-23 ENCOUNTER — Emergency Department
Admission: EM | Admit: 2023-03-23 | Discharge: 2023-03-23 | Disposition: A | Discharge status: Home / Self Care | Attending: Emergency Medicine | Admitting: Emergency Medicine

## 2023-03-23 ENCOUNTER — Emergency Department

## 2023-03-23 ENCOUNTER — Other Ambulatory Visit: Payer: Self-pay

## 2023-03-23 DIAGNOSIS — J189 Pneumonia, unspecified organism: Secondary | ICD-10-CM

## 2023-03-23 DIAGNOSIS — R0602 Shortness of breath: Secondary | ICD-10-CM | POA: Diagnosis not present

## 2023-03-23 DIAGNOSIS — J181 Lobar pneumonia, unspecified organism: Secondary | ICD-10-CM | POA: Insufficient documentation

## 2023-03-23 DIAGNOSIS — R0789 Other chest pain: Secondary | ICD-10-CM | POA: Diagnosis not present

## 2023-03-23 DIAGNOSIS — J209 Acute bronchitis, unspecified: Secondary | ICD-10-CM | POA: Diagnosis not present

## 2023-03-23 DIAGNOSIS — I10 Essential (primary) hypertension: Secondary | ICD-10-CM | POA: Insufficient documentation

## 2023-03-23 DIAGNOSIS — R059 Cough, unspecified: Secondary | ICD-10-CM | POA: Diagnosis present

## 2023-03-23 DIAGNOSIS — R079 Chest pain, unspecified: Secondary | ICD-10-CM | POA: Diagnosis not present

## 2023-03-23 LAB — RESP PANEL BY RT-PCR (RSV, FLU A&B, COVID)  RVPGX2
Influenza A by PCR: NEGATIVE
Influenza B by PCR: NEGATIVE
Resp Syncytial Virus by PCR: NEGATIVE
SARS Coronavirus 2 by RT PCR: NEGATIVE

## 2023-03-23 LAB — CBC
HCT: 38 % (ref 36.0–46.0)
Hemoglobin: 13.2 g/dL (ref 12.0–15.0)
MCH: 30.6 pg (ref 26.0–34.0)
MCHC: 34.7 g/dL (ref 30.0–36.0)
MCV: 88.2 fL (ref 80.0–100.0)
Platelets: 214 10*3/uL (ref 150–400)
RBC: 4.31 MIL/uL (ref 3.87–5.11)
RDW: 13.2 % (ref 11.5–15.5)
WBC: 8.2 10*3/uL (ref 4.0–10.5)
nRBC: 0 % (ref 0.0–0.2)

## 2023-03-23 LAB — BASIC METABOLIC PANEL
Anion gap: 12 (ref 5–15)
BUN: 6 mg/dL (ref 6–20)
CO2: 25 mmol/L (ref 22–32)
Calcium: 9.1 mg/dL (ref 8.9–10.3)
Chloride: 105 mmol/L (ref 98–111)
Creatinine, Ser: 0.77 mg/dL (ref 0.44–1.00)
GFR, Estimated: >60 mL/min (ref >60)
Glucose, Bld: 112 mg/dL — ABNORMAL HIGH (ref 70–99)
Potassium: 3.3 mmol/L — ABNORMAL LOW (ref 3.5–5.1)
Sodium: 142 mmol/L (ref 135–145)

## 2023-03-23 LAB — TROPONIN I (HIGH SENSITIVITY): Troponin I (High Sensitivity): 5 ng/L (ref <18)

## 2023-03-23 MED ORDER — FLUCONAZOLE 100 MG PO TABS
100.0000 mg | ORAL_TABLET | ORAL | 0 refills | Status: DC
Start: 2023-03-27 — End: 2023-03-30

## 2023-03-23 MED ORDER — AZITHROMYCIN 250 MG PO TABS: ORAL_TABLET | ORAL | 0 refills | Status: DC

## 2023-03-23 MED ORDER — IPRATROPIUM BROMIDE HFA 17 MCG/ACT IN AERS: 2.0000 | INHALATION_SPRAY | Freq: Four times a day (QID) | RESPIRATORY_TRACT | 2 refills | Status: DC

## 2023-03-23 MED ORDER — BENZONATATE 100 MG PO CAPS: 100.0000 mg | ORAL_CAPSULE | Freq: Three times a day (TID) | ORAL | 0 refills | Status: DC | PRN

## 2023-03-23 MED ORDER — IPRATROPIUM BROMIDE 0.02 % IN SOLN
0.5000 mg | Freq: Once | RESPIRATORY_TRACT | Status: AC
  Administered 2023-03-23: 0.5 mg via RESPIRATORY_TRACT
  Filled 2023-03-23: qty 2.5

## 2023-03-23 MED ORDER — BENZONATATE 100 MG PO CAPS
200.0000 mg | ORAL_CAPSULE | Freq: Once | ORAL | Status: AC
  Administered 2023-03-23: 200 mg via ORAL
  Filled 2023-03-23: qty 2

## 2023-03-23 MED ORDER — PREDNISONE 20 MG PO TABS
40.0000 mg | ORAL_TABLET | ORAL | Status: AC
  Administered 2023-03-23: 40 mg via ORAL
  Filled 2023-03-23: qty 2

## 2023-03-23 NOTE — ED Notes (Signed)
 See triage notes. Patient c/o shortness of breath, lungs feeling "heavy," chest and neck pain. Patient stated she was diagnosed with the flu two weeks ago and just can't shake it. Patient appears anxious.

## 2023-03-23 NOTE — Discharge Instructions (Addendum)
 Stop albuterol, which can counteract your Nadolol and worsen your Long QT syndrome. Ipatropium (Atrovent) is safe.  Take tessalon as needed to help alleviate cough.

## 2023-03-23 NOTE — ED Triage Notes (Signed)
 Pt to ED via POV from home. Pt reports CP, SOB, congestion and cough x2 wks. Pt reports dx with Flu 2wks ago. Pt reports woke up this morning with worsening SOB and dizziness. Pt also reports neck pain. Pt states recently lost 54yr old nephew.

## 2023-03-23 NOTE — ED Provider Notes (Signed)
 [facility] Provider Note    Event Date/Time   First MD Initiated Contact with Patient 03/23/23 [phone]     (approximate)   History   Chief Complaint: Chest Pain and Shortness of Breath   HPI  Holly Nicholson is a 38 y.o. female with a history of hypertension, anxiety, depression who comes ED complaining of chest pain shortness of breath cough for the past 2 weeks.  Was diagnosed with flu, and symptoms have mostly gotten better but cough and shortness of breath remain.  Denies fever.  Cough is nonproductive.  No lower extremity swelling or calf pain.          Physical Exam   Triage Vital Signs: ED Triage Vitals  Encounter Vitals Group     BP 03/23/23 0819 (!) 144/82     Systolic BP Percentile --      Diastolic BP Percentile --      Pulse Rate 03/23/23 0819 74     Resp 03/23/23 0819 20     Temp 03/23/23 0819 98.1 F (36.7 C)     Temp Source 03/23/23 0819 Oral     SpO2 03/23/23 0819 97 %     Weight --      Height --      Head Circumference --      Peak Flow --      Pain Score 03/23/23 0820 5     Pain Loc --      Pain Education --      Exclude from Growth Chart --     Most recent vital signs: Vitals:   03/23/23 0819  BP: (!) 144/82  Pulse: 74  Resp: 20  Temp: 98.1 F (36.7 C)  SpO2: 97%    General: Awake, no distress.  CV:  Good peripheral perfusion.  Regular rate and rhythm Resp:  Normal effort.  Diffuse expiratory wheezing.  Coughing fit provoked with FEV1 maneuver Abd:  No distention.  Soft nontender Other:  No leg swelling or calf tenderness   ED Results / Procedures / Treatments   Labs (all labs ordered are listed, but only abnormal results are displayed) Labs Reviewed  BASIC METABOLIC PANEL - Abnormal; Notable for the following components:      Result Value   Potassium 3.3 (*)    Glucose, Bld 112 (*)    All other components within normal limits  RESP PANEL BY RT-PCR (RSV, FLU A&B, COVID)  RVPGX2  CBC  TROPONIN I  (HIGH SENSITIVITY)     EKG Interpreted by me Sinus rhythm rate of 68.  Normal axis intervals QRS ST segments and T waves   RADIOLOGY Chest x-ray interpreted by me, shows some patchy opacity in the left lower lung concerning for developing community-acquired pneumonia.  Radiology report reviewed.   PROCEDURES:  Procedures   MEDICATIONS ORDERED IN ED: Medications  predniSONE (DELTASONE) tablet 40 mg (40 mg Oral Given 03/23/23 0902)  ipratropium (ATROVENT) nebulizer solution 0.5 mg (0.5 mg Nebulization Given 03/23/23 0902)  benzonatate (TESSALON) capsule 200 mg (200 mg Oral Given 03/23/23 0920)     IMPRESSION / MDM / ASSESSMENT AND PLAN / ED COURSE  I reviewed the triage vital signs and the nursing notes.  DDx: Bronchitis, pneumonia, pleural effusion, COVID, RSV, myocarditis  Patient's presentation is most consistent with acute presentation with potential threat to life or bodily function.  Patient presents with shortness of breath and cough for the past 2 weeks after viral illness.  Presentation is consistent  with bronchitis.  X-ray also concerning for developing pneumonia.  Will start on azithromycin.  Due to her history of long QT syndrome, I have discontinued the albuterol that her PCP prescribed for her and replaced it with Atrovent.  Give the patient a dose of prednisone as well to help with her symptoms.       FINAL CLINICAL IMPRESSION(S) / ED DIAGNOSES   Final diagnoses:  Acute bronchitis, unspecified organism  Community acquired pneumonia of left lower lobe of lung     Rx / DC Orders   ED Discharge Orders          Ordered    fluconazole (DIFLUCAN) 100 MG tablet  Weekly        03/23/23 1011    ipratropium (ATROVENT HFA) 17 MCG/ACT inhaler  4 times daily        03/23/23 0902    benzonatate (TESSALON PERLES) 100 MG capsule  3 times daily PRN        03/23/23 0902    azithromycin (ZITHROMAX Z-PAK) 250 MG tablet        03/23/23 1005             Note:   This document was prepared using Dragon voice recognition software and may include unintentional dictation errors.   Sharman Cheek, MD 03/23/23 1501

## 2023-03-26 ENCOUNTER — Other Ambulatory Visit: Payer: Self-pay | Admitting: *Deleted

## 2023-03-26 NOTE — Patient Outreach (Signed)
 Medicaid Managed Care   Nurse Care Manager Note  03/26/2023 Name:  Holly Nicholson MRN:  [id number] DOB:  08-Sep-1985  Holly Nicholson is an 38 y.o. year old female who is a primary patient of Wrenly, Lauritsen, DO.  The [facility] Managed Care Coordination team was consulted for assistance with:    QT Prolongation  Mental Health  Holly Nicholson was given information about Medicaid Managed Care Coordination team services today. Holly Nicholson Patient agreed to services and verbal consent obtained.  Engaged with patient by telephone for initial visit in response to provider referral for case management and/or care coordination services.   Patient is participating in a Managed Medicaid Plan:  Yes  Assessments/Interventions:  Review of past medical history, allergies, medications, health status, including review of consultants reports, laboratory and other test data, was performed as part of comprehensive evaluation and provision of chronic care management services.  SDOH (Social Drivers of Health) assessments and interventions performed: SDOH Interventions    Flowsheet Row Patient Outreach from 03/26/2023 in [place] HEALTH POPULATION HEALTH DEPARTMENT Office Visit from 03/09/2023 in [facility] Office Visit from 11/11/2021 in [facility] Office Visit from 02/10/2019 in [facility]  SDOH Interventions      Food Insecurity Interventions Other (Comment)  [BSW referral] -- -- --  Housing Interventions Other (Comment)  [BSW referral] -- -- --  Transportation Interventions Intervention Not Indicated -- -- --  Utilities Interventions Other (Comment)  [BSW referral] -- -- --  Depression Interventions/Treatment  -- Referral to Psychiatry, Counseling Medication Currently on Treatment       Care Plan  Allergies  Allergen Reactions   Onion Anaphylaxis, Swelling and Hives    Raw onions causes throat to swell   Latex Dermatitis     Medications Reviewed Today     Reviewed by Heidi Dach, RN (Registered Nurse) on 03/26/23 at 0945  Med List Status: <None>   Medication Order Taking? Sig Documenting Provider Last Dose Status Informant  aspirin EC 81 MG EC tablet [id number] Yes Take 1 tablet (81 mg total) by mouth daily. Swallow whole. Enedina Finner, MD Taking Active Pharmacy Records, Multiple Informants, Other  atorvastatin (LIPITOR) 10 MG tablet [id number] Yes Take 1 tablet (10 mg total) by mouth daily. Rollings, Megan P, DO Taking Active   azithromycin (ZITHROMAX Z-PAK) 250 MG tablet [id number] Yes Take 2 tablets (500 mg) on  Day 1,  followed by 1 tablet (250 mg) once daily on Days 2 through 5. Sharman Cheek, MD Taking Active   benzonatate (TESSALON PERLES) 100 MG capsule [id number] Yes Take 1 capsule (100 mg total) by mouth 3 (three) times daily as needed for up to 10 days for cough. Sharman Cheek, MD Taking Active   fluconazole (DIFLUCAN) 100 MG tablet [id number] No Take 1 tablet (100 mg total) by mouth once a week for 2 doses. Take first dose when finishing the antibiotics. May take second dose 1 week later if symptoms persist.  Patient not taking: Reported on 03/26/2023   Sharman Cheek, MD Not Taking Active   ipratropium (ATROVENT HFA) 17 MCG/ACT inhaler [id number] No Inhale 2 puffs into the lungs 4 (four) times daily.  Patient not taking: Reported on 03/26/2023   Sharman Cheek, MD Not Taking Active   lurasidone (LATUDA) 20 MG TABS tablet [id number] Yes Take 1 tablet (20 mg total) by mouth daily. Olevia Perches P, DO Taking Active   nadolol (CORGARD) 40 MG tablet [id number]  Yes Take 1 tablet (40 mg total) by mouth daily. Sherie Don, NP Taking Active   nitroGLYCERIN (NITROSTAT) 0.4 MG SL tablet [id number] No Place 1 tablet (0.4 mg total) under the tongue every 5 (five) minutes as needed for chest pain.  Patient not taking: Reported on 03/26/2023   Enedina Finner, MD Not Taking Active Pharmacy Records, Multiple  Informants, Other  oseltamivir (TAMIFLU) 75 MG capsule [id number] No Take 1 capsule (75 mg total) by mouth 2 (two) times daily.  Patient not taking: Reported on 03/26/2023   Dorcas Carrow, DO Not Taking Active   pantoprazole (PROTONIX) 40 MG tablet [id number] Yes Take 1 tablet (40 mg total) by mouth daily. Dorcas Carrow, DO Taking Active             Patient Active Problem List   Diagnosis Date Noted   B12 deficiency 11/14/2021   Migraine    QT prolongation 02/05/2021   TIA (transient ischemic attack) 02/04/2021   Essential hypertension 02/16/2019   MDD (major depressive disorder) 05/04/2018   S/P laparoscopic hysterectomy 10/30/2017   BMI 45.0-49.9, adult (HCC) 04/27/2016   Morbid obesity (HCC) 09/06/2014   Vitamin D deficiency 09/04/2014   Generalized anxiety disorder 08/20/2014   Bipolar affective disorder, currently depressed, moderate (HCC) 08/20/2014   Tobacco abuse 08/20/2014    Conditions to be addressed/monitored per PCP order:   QT Prolongation  Care Plan : RN Care Manager Plan of Care  Updates made by Heidi Dach, RN since 03/26/2023 12:00 AM     Problem: Health Management needs related to QT Prolongation and Mental Health      Long-Range Goal: Development of Plan of Care to address Health Management needs related to QT Prolongation and Mental Health   Start Date: 03/26/2023  Expected End Date: 06/24/2023  Note:   Current Barriers:  Chronic Disease Management support and education needs related to QT Prolongation and Mental Health   RNCM Clinical Goal(s):  Patient will verbalize understanding of plan for management of QT Prolongation and Mental Health as evidenced by patient reports take all medications exactly as prescribed and will call provider for medication related questions as evidenced by Patient reports attend all scheduled medical appointments: 03/29/23 with Cardiology and 03/30/23 with PCP as evidenced by provider documentation in EMR work with  Child psychotherapist to address  related to the management of Financial constraints related to food, housing and utilities and Mental Health Concerns  related to the management of QT Prolongation as evidenced by review of EMR and patient or Child psychotherapist report through collaboration with Medical illustrator, provider, and care team.   Interventions: Evaluation of current treatment plan related to  self management and patient's adherence to plan as established by provider  Mental Health  (Status:  New goal.)  Long Term Goal Evaluation of current treatment plan related to  Mental Health ,  self-management and patient's adherence to plan as established by provider. Discussed plans with patient for ongoing care management follow up and provided patient with direct contact information for care management team Reviewed medications with patient and discussed taking all medications to upcoming appointments Reviewed scheduled/upcoming provider appointments including 03/29/23 with Cardiology and 03/30/23 with PCP Social Work referral for Mental Health-scheduled with LCSW on 04/05/23 Assessed social determinant of health barriers Provided patient with Medicaid ID number, advised patient to provide [place] with Medicaid ID number and schedule an appointment-referral was placed by PCP Reviewed member benefits provided by [facility], advised patient to contact member services  [phone] to inquire Provided patient with [facility] Medical transportation [phone]   QT Prolongation  (Status:  New goal.)  Long Term Goal Evaluation of current treatment plan related to  QT Prolongation ,  self-management and patient's adherence to plan as established by provider. Discussed plans with patient for ongoing care management follow up and provided patient with direct contact information for care management team Advised patient to take all medications to upcoming Cardiology appointment Reviewed medications with patient and discussed  how to transfer all medications to one pharmacy and requesting 90 day refills for applicable medications Reviewed scheduled/upcoming provider appointments including 03/29/23 with Cardiology and 03/30/23 with PCP Social Work referral for financial resources(food, rent and utilities)-scheduled on 03/29/23 Assessed social determinant of health barriers Advised patient to work on smoking cessation, education provided via MyChart  Patient Goals/Self-Care Activities: Take all medications as prescribed Attend all scheduled provider appointments Call provider office for new concerns or questions  Work with the social worker to address care coordination needs and will continue to work with the clinical team to address health care and disease management related needs  Follow Up Plan:  Telephone follow up appointment with care management team member scheduled for:  04/03/23 at 11:30am      Follow Up:  Patient agrees to Care Plan and Follow-up.  Plan: The Managed Medicaid care management team will reach out to the patient again over the next 14 days.  Date/time of next scheduled RN care management/care coordination outreach:  04/03/23 at 11:30am  Estanislado Emms RN, BSN [place]  Value-Based Care [facility] Health RN Care Manager [phone]

## 2023-03-26 NOTE — Patient Instructions (Signed)
 Visit Information  [name] was given information about Medicaid Managed Care team care coordination services as a part of their [facility] Medicaid benefit. Holly Nicholson verbally consented to engagement with the [facility] Managed Care team.   If you are experiencing a medical emergency, please call 911 or report to your local emergency department or urgent care.   If you have a non-emergency medical problem during routine business hours, please contact your provider's office and ask to speak with a nurse.   For questions related to your [facility] health plan, please call: [phone] or go here:[email or web address]  If you would like to schedule transportation through your [facility] plan, please call the following number at least 2 days in advance of your appointment: [phone].   You can also use the MTM portal or MTM mobile app to manage your rides. Reimbursement for transportation is available through [facility]! For the portal, please go to [email or web address]  Call the [facility] Crisis Line at [phone], at any time, 24 hours a day, 7 days a week. If you are in danger or need immediate medical attention call 911.  If you would like help to quit smoking, call 1-800-QUIT-NOW ((787)191-1367) OR Espaol: 1-855-Djelo-Ya (1-027-253-6644) o para ms informacin haga clic aqu or Text READY to 034-742 to register via text  Holly Nicholson,   Please see education materials related to smoking cessation and using an inhaler provided by MyChart link.  Patient verbalizes understanding of instructions and care plan provided today and agrees to view in MyChart. Active MyChart status and patient understanding of how to access instructions and care plan via MyChart confirmed with patient.     Telephone follow up appointment with Managed Medicaid care management team member scheduled for:04/03/23 at 11:30am  Holly Emms RN, BSN [place]  Value-Based Care  [facility] Health RN Care Manager [phone]   Following is a copy of your plan of care:  Care Plan : RN Care Manager Plan of Care  Updates made by Holly Dach, RN since 03/26/2023 12:00 AM     Problem: Health Management needs related to QT Prolongation and Mental Health      Long-Range Goal: Development of Plan of Care to address Health Management needs related to QT Prolongation and Mental Health   Start Date: 03/26/2023  Expected End Date: 06/24/2023  Note:   Current Barriers:  Chronic Disease Management support and education needs related to QT Prolongation and Mental Health   RNCM Clinical Goal(s):  Patient will verbalize understanding of plan for management of QT Prolongation and Mental Health as evidenced by patient reports take all medications exactly as prescribed and will call provider for medication related questions as evidenced by Patient reports attend all scheduled medical appointments: 03/29/23 with Cardiology and 03/30/23 with PCP as evidenced by provider documentation in EMR work with social worker to address  related to the management of Financial constraints related to food, housing and utilities and Mental Health Concerns  related to the management of QT Prolongation as evidenced by review of EMR and patient or Child psychotherapist report through collaboration with Medical illustrator, provider, and care team.   Interventions: Evaluation of current treatment plan related to  self management and patient's adherence to plan as established by provider  Mental Health  (Status:  New goal.)  Long Term Goal Evaluation of current treatment plan related to  Mental Health ,  self-management and patient's adherence to plan as established by provider. Discussed plans with patient for ongoing  care management follow up and provided patient with direct contact information for care management team Reviewed medications with patient and discussed taking all medications to upcoming  appointments Reviewed scheduled/upcoming provider appointments including 03/29/23 with Cardiology and 03/30/23 with PCP Social Work referral for Mental Health-scheduled with LCSW on 04/05/23 Assessed social determinant of health barriers Provided patient with Medicaid ID number, advised patient to provide [place] with Medicaid ID number and schedule an appointment-referral was placed by PCP Reviewed member benefits provided by [facility], advised patient to contact member services [phone] to inquire Provided patient with [facility] Medical transportation [phone]   QT Prolongation  (Status:  New goal.)  Long Term Goal Evaluation of current treatment plan related to  QT Prolongation ,  self-management and patient's adherence to plan as established by provider. Discussed plans with patient for ongoing care management follow up and provided patient with direct contact information for care management team Advised patient to take all medications to upcoming Cardiology appointment Reviewed medications with patient and discussed how to transfer all medications to one pharmacy and requesting 90 day refills for applicable medications Reviewed scheduled/upcoming provider appointments including 03/29/23 with Cardiology and 03/30/23 with PCP Social Work referral for financial resources(food, rent and utilities)-scheduled on 03/29/23 Assessed social determinant of health barriers Advised patient to work on smoking cessation, education provided via MyChart  Patient Goals/Self-Care Activities: Take all medications as prescribed Attend all scheduled provider appointments Call provider office for new concerns or questions  Work with the social worker to address care coordination needs and will continue to work with the clinical team to address health care and disease management related needs  Follow Up Plan:  Telephone follow up appointment with care management team member scheduled for:  04/03/23 at  11:30am

## 2023-03-29 ENCOUNTER — Ambulatory Visit: Admitting: Internal Medicine

## 2023-03-29 ENCOUNTER — Other Ambulatory Visit: Payer: Self-pay

## 2023-03-29 NOTE — Patient Instructions (Signed)
 Visit Information  Ms. Derocher was given information about Medicaid Managed Care team care coordination services as a part of their [place] Medicaid benefit. [name] verbally consented to engagement with the [facility] Managed Care team.   If you are experiencing a medical emergency, please call 911 or report to your local emergency department or urgent care.   If you have a non-emergency medical problem during routine business hours, please contact your provider's office and ask to speak with a nurse.   For questions related to your [facility] health plan, please call: [phone] or go here:[email or web address]  If you would like to schedule transportation through your [facility] plan, please call the following number at least 2 days in advance of your appointment: [phone].   You can also use the MTM portal or MTM mobile app to manage your rides. Reimbursement for transportation is available through [facility]! For the portal, please go to [email or web address]  Call the [facility] Crisis Line at [phone], at any time, 24 hours a day, 7 days a week. If you are in danger or need immediate medical attention call 911.  If you would like help to quit smoking, call 1-800-QUIT-NOW ((360)568-9814) OR Espaol: 1-855-Djelo-Ya (6-644-034-7425) o para ms informacin haga clic aqu or Text READY to 956-387 to register via text  Ms. Rabold - following are the goals we discussed in your visit today:   Goals Addressed   None     Social Worker will follow up on 04/19/23.   Gus Puma, Kenard Gower, MHA [facility] Health  Managed Medicaid Social Worker [id number]   Following is a copy of your plan of care:  There are no care plans that you recently modified to display for this patient.

## 2023-03-29 NOTE — Patient Outreach (Signed)
 Medicaid Managed Care Social Work Note  03/29/2023 Name:  Holly Nicholson MRN:  [id number] DOB:  [birth year]  Holly Nicholson is an 38 y.o. year old female who is a primary patient of Angelin, Cutrone, DO.  The Medicaid Managed Care Coordination team was consulted for assistance with:  Community Resources   Holly Nicholson was given information about Medicaid Managed Care Coordination team services today. Holly Nicholson Patient agreed to services and verbal consent obtained.  Engaged with patient  for by telephone forinitial visit in response to referral for case management and/or care coordination services.   Patient is participating in a Managed Medicaid Plan:  Yes  Assessments/Interventions:  Review of past medical history, allergies, medications, health status, including review of consultants reports, laboratory and other test data, was performed as part of comprehensive evaluation and provision of chronic care management services.  SDOH: (Social Drivers of Health) assessments and interventions performed: SDOH Interventions    Flowsheet Row Patient Outreach from 03/26/2023 in [place] HEALTH POPULATION HEALTH DEPARTMENT Office Visit from 03/09/2023 in [facility] [facility] Office Visit from 11/11/2021 in [facility] Office Visit from 02/10/2019 in [facility]  SDOH Interventions      Food Insecurity Interventions Other (Comment)  [BSW referral] -- -- --  Housing Interventions Other (Comment)  [BSW referral] -- -- --  Transportation Interventions Intervention Not Indicated -- -- --  Utilities Interventions Other (Comment)  [BSW referral] -- -- --  Depression Interventions/Treatment  -- Referral to Psychiatry, Counseling Medication Currently on Treatment     BSW completed a telephone outreach with patient, she states she needs assistance with rent and utilities. She does not have a cut off notice yet. Patient states she does not have  any income, due to some mental issues she has been having it is hard for her to keep a job. BSW and patient agreed for resources for rent and utilities to be emailed to address on file. Patient states she would also like resources for mental health, but declines a referral for LCSW at this time. Patient and children are receiving foodstamps.  Advanced Directives Status:  Not addressed in this encounter.  Care Plan                 Allergies  Allergen Reactions   Onion Anaphylaxis, Swelling and Hives    Raw onions causes throat to swell   Latex Dermatitis    Medications Reviewed Today   Medications were not reviewed in this encounter     Patient Active Problem List   Diagnosis Date Noted   B12 deficiency 11/14/2021   Migraine    QT prolongation 02/05/2021   TIA (transient ischemic attack) 02/04/2021   Essential hypertension 02/16/2019   MDD (major depressive disorder) 05/04/2018   S/P laparoscopic hysterectomy 10/30/2017   BMI 45.0-49.9, adult (HCC) 04/27/2016   Morbid obesity (HCC) 09/06/2014   Vitamin D deficiency 09/04/2014   Generalized anxiety disorder 08/20/2014   Bipolar affective disorder, currently depressed, moderate (HCC) 08/20/2014   Tobacco abuse 08/20/2014    Conditions to be addressed/monitored per PCP order:   community resources  There are no care plans that you recently modified to display for this patient.   Follow up:  Patient agrees to Care Plan and Follow-up.  Plan: The Managed Medicaid care management team will reach out to the patient again over the next 15 days.  Date/time of next scheduled Social Work care management/care coordination outreach:  04/19/23  Holly Nicholson, MHA [place]  Managed [facility] Social Worker [phone]

## 2023-03-30 ENCOUNTER — Encounter: Payer: Self-pay | Admitting: Family Medicine

## 2023-03-30 ENCOUNTER — Ambulatory Visit (INDEPENDENT_AMBULATORY_CARE_PROVIDER_SITE_OTHER): Payer: Medicaid Other | Admitting: Family Medicine

## 2023-03-30 ENCOUNTER — Telehealth: Payer: Self-pay | Admitting: Family Medicine

## 2023-03-30 VITALS — BP 126/82 | Temp 98.3°F | Resp 16

## 2023-03-30 DIAGNOSIS — Z0000 Encounter for general adult medical examination without abnormal findings: Secondary | ICD-10-CM

## 2023-03-30 DIAGNOSIS — I10 Essential (primary) hypertension: Secondary | ICD-10-CM

## 2023-03-30 DIAGNOSIS — F3132 Bipolar disorder, current episode depressed, moderate: Secondary | ICD-10-CM

## 2023-03-30 DIAGNOSIS — Z113 Encounter for screening for infections with a predominantly sexual mode of transmission: Secondary | ICD-10-CM

## 2023-03-30 NOTE — Assessment & Plan Note (Signed)
Under good control on current regimen. Continue current regimen. Continue to monitor. Call with any concerns. Labs drawn today.  

## 2023-03-30 NOTE — Assessment & Plan Note (Signed)
 Unable to get latuda- will check on this. Call with any concerns.
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
## 2023-03-30 NOTE — Telephone Encounter (Signed)
 Unable to get her latuda per pharmacy- on medicaid preferred drug list- can we find out what's going on

## 2023-03-31 ENCOUNTER — Other Ambulatory Visit: Payer: Self-pay | Admitting: Family Medicine

## 2023-03-31 LAB — MICROALBUMIN, URINE WAIVED
Creatinine, Urine Waived: 100 mg/dL (ref 10–300)
Microalb, Ur Waived: 80 mg/L — ABNORMAL HIGH (ref 0–19)

## 2023-03-31 LAB — WET PREP FOR TRICH, YEAST, CLUE
Clue Cell Exam: NEGATIVE
Trichomonas Exam: NEGATIVE
Yeast Exam: NEGATIVE

## 2023-03-31 LAB — BAYER DCA HB A1C WAIVED: HB A1C (BAYER DCA - WAIVED): 5.3 % (ref 4.8–5.6)

## 2023-04-02 NOTE — Telephone Encounter (Signed)
 Requested medications are due for refill today.  A little too soon  Requested medications are on the active medications list.  yes  Last refill. 03/09/2023 330 1 rf  Future visit scheduled.   no  Notes to clinic.  Refill/refusal not delegated.    Requested Prescriptions  Pending Prescriptions Disp Refills   lurasidone (LATUDA) 20 MG TABS tablet [Pharmacy Med Name: LURASIDONE HCL 20 MG TABLET] 90 tablet 1    Sig: TAKE 1 TABLET BY MOUTH EVERY DAY     Not Delegated - Psychiatry:  Antipsychotics - Second Generation (Atypical) - lurasidone Failed - 04/02/2023  3:43 PM      Failed - This refill cannot be delegated      Failed - Valid encounter within last 6 months    Recent Outpatient Visits           1 year ago Bipolar affective disorder, currently depressed, moderate (HCC)   [place] [facility] [place], Megan P, DO   1 year ago Fatigue, unspecified type   [place] [facility] [place], Megan P, DO   3 years ago Essential hypertension   [place] [facility] [place] Maser [place], [place]   4 years ago Essential hypertension   [place] [facility] Ro[place]velt Maser [place], [place]   4 years ago Essential hypertension   [place] [facility] Part[place], [place]       Future Appointments             In 1 month Duke Salvia, MD [facility]            Failed - Lipid Panel in normal range within the last 12 months    Cholesterol, Total  Date Value Ref Range Status  03/30/2023 105 100 - 199 mg/dL Final   LDL Chol Calc (NIH)  Date Value Ref Range Status  03/30/2023 40 0 - 99 mg/dL Final   HDL  Date Value Ref Range Status  03/30/2023 35 (L) >39 mg/dL Final   Triglycerides  Date Value Ref Range Status  03/30/2023 184 (H) 0 - 149 mg/dL Final         Failed - CMP within normal limits and completed in the last 12 months    Albumin  Date Value Ref Range Status   03/30/2023 4.1 3.9 - 4.9 g/dL Final  16/10/9602 3.7 3.4 - 5.0 g/dL Final   Alkaline Phosphatase  Date Value Ref Range Status  03/30/2023 119 44 - 121 IU/L Final  03/08/2013 90 Unit/L Final    Comment:    45-117 NOTE: New Reference Range 11/29/12    ALT  Date Value Ref Range Status  03/30/2023 28 0 - 32 IU/L Final   SGPT (ALT)  Date Value Ref Range Status  03/08/2013 29 12 - 78 U/L Final   AST  Date Value Ref Range Status  03/30/2023 25 0 - 40 IU/L Final   SGOT(AST)  Date Value Ref Range Status  03/08/2013 23 15 - 37 Unit/L Final   BUN  Date Value Ref Range Status  03/30/2023 5 (L) 6 - 20 mg/dL Final  54/09/8117 4 (L) 7 - 18 mg/dL Final   Calcium  Date Value Ref Range Status  03/30/2023 9.0 8.7 - 10.2 mg/dL Final   Calcium, Total  Date Value Ref Range Status  03/08/2013 8.6 8.5 - 10.1 mg/dL Final   CO2  Date Value Ref Range Status  03/30/2023 25 20 -  29 mmol/L Final   Co2  Date Value Ref Range Status  03/08/2013 25 21 - 32 mmol/L Final   Creatinine  Date Value Ref Range Status  03/08/2013 0.70 0.60 - 1.30 mg/dL Final   Creatinine, Ser  Date Value Ref Range Status  03/30/2023 0.84 0.57 - 1.00 mg/dL Final   Creatinine, Urine  Date Value Ref Range Status  06/08/2016 250 mg/dL Final   Glucose  Date Value Ref Range Status  03/30/2023 89 70 - 99 mg/dL Final  02/72/5366 89 65 - 99 mg/dL Final   Glucose, Bld  Date Value Ref Range Status  03/23/2023 112 (H) 70 - 99 mg/dL Final    Comment:    Glucose reference range applies only to samples taken after fasting for at least 8 hours.   Glucose-Capillary  Date Value Ref Range Status  02/04/2021 96 70 - 99 mg/dL Final    Comment:    Glucose reference range applies only to samples taken after fasting for at least 8 hours.   Potassium  Date Value Ref Range Status  03/30/2023 4.1 3.5 - 5.2 mmol/L Final  03/08/2013 3.4 (L) 3.5 - 5.1 mmol/L Final   Sodium  Date Value Ref Range Status  03/30/2023 140  134 - 144 mmol/L Final  03/08/2013 136 136 - 145 mmol/L Final   Bilirubin,Total  Date Value Ref Range Status  03/08/2013 0.5 0.2 - 1.0 mg/dL Final   Bilirubin Total  Date Value Ref Range Status  03/30/2023 1.1 0.0 - 1.2 mg/dL Final   Protein, ur  Date Value Ref Range Status  12/24/2018 NEGATIVE NEGATIVE mg/dL Final   Protein,UA  Date Value Ref Range Status  11/11/2021 Trace (A) Negative/Trace Final   Total Protein, Urine  Date Value Ref Range Status  06/08/2016 25 mg/dL Final    Comment:    NO NORMAL RANGE ESTABLISHED FOR THIS TEST   Total Protein  Date Value Ref Range Status  03/30/2023 6.8 6.0 - 8.5 g/dL Final  44/03/4740 7.5 6.4 - 8.2 g/dL Final   EGFR (African American)  Date Value Ref Range Status  03/08/2013 >60  Final   GFR calc Af Amer  Date Value Ref Range Status  02/10/2019 110 >59 mL/min/1.73 Final   eGFR  Date Value Ref Range Status  03/30/2023 91 >59 mL/min/1.73 Final   EGFR (Non-African Amer.)  Date Value Ref Range Status  03/08/2013 >60  Final    Comment:    eGFR values <47mL/min/1.73 m2 may be an indication of chronic kidney disease (CKD). Calculated eGFR is useful in patients with stable renal function. The eGFR calculation will not be reliable in acutely ill patients when serum creatinine is changing rapidly. It is not useful in  patients on dialysis. The eGFR calculation may not be applicable to patients at the low and high extremes of body sizes, pregnant women, and vegetarians.    GFR, Estimated  Date Value Ref Range Status  03/23/2023 >60 >60 mL/min Final    Comment:    (NOTE) Calculated using the CKD-EPI Creatinine Equation (2021)          Passed - TSH in normal range and within 360 days    TSH  Date Value Ref Range Status  03/30/2023 1.730 0.450 - 4.500 uIU/mL Final         Passed - Completed PHQ-2 or PHQ-9 in the last 360 days      Passed - Last BP in normal range    BP Readings from Last 1  Encounters:  03/30/23  126/82         Passed - Last Heart Rate in normal range    Pulse Readings from Last 1 Encounters:  03/23/23 74         Passed - CBC within normal limits and completed in the last 12 months    WBC  Date Value Ref Range Status  03/30/2023 5.1 3.4 - 10.8 x10E3/uL Final  03/23/2023 8.2 4.0 - 10.5 K/uL Final   RBC  Date Value Ref Range Status  03/30/2023 4.49 3.77 - 5.28 x10E6/uL Final  03/23/2023 4.31 3.87 - 5.11 MIL/uL Final   Hemoglobin  Date Value Ref Range Status  03/30/2023 13.8 11.1 - 15.9 g/dL Final  40/98/1191 47.8 g/dL Final   HCT  Date Value Ref Range Status  10/21/2015 33 % Final   Hematocrit  Date Value Ref Range Status  03/30/2023 40.7 34.0 - 46.6 % Final   MCHC  Date Value Ref Range Status  03/30/2023 33.9 31.5 - 35.7 g/dL Final  29/56/2130 86.5 30.0 - 36.0 g/dL Final   [facility] [facility]  Date Value Ref Range Status  03/30/2023 30.7 26.6 - 33.0 pg Final  03/23/2023 30.6 26.0 - 34.0 pg Final   MCV  Date Value Ref Range Status  03/30/2023 91 79 - 97 fL Final  09/29/2013 87 80 - 100 fL Final   No results found for: "PLTCOUNTKUC", "LABPLAT", "POCPLA" RDW  Date Value Ref Range Status  03/30/2023 13.5 11.7 - 15.4 % Final  09/29/2013 14.1 11.5 - 14.5 % Final

## 2023-04-03 ENCOUNTER — Other Ambulatory Visit: Payer: Self-pay | Admitting: *Deleted

## 2023-04-03 ENCOUNTER — Encounter: Payer: Self-pay | Admitting: Family Medicine

## 2023-04-03 LAB — GC/CHLAMYDIA PROBE AMP
Chlamydia trachomatis, NAA: NEGATIVE
Neisseria Gonorrhoeae by PCR: NEGATIVE

## 2023-04-03 NOTE — Patient Instructions (Signed)
 Visit Information  [name] was given information about Medicaid Managed Care team care coordination services as a part of their [facility] Medicaid benefit. Holly Nicholson verbally consented to engagement with the [facility] Managed Care team.   If you are experiencing a medical emergency, please call 911 or report to your local emergency department or urgent care.   If you have a non-emergency medical problem during routine business hours, please contact your provider's office and ask to speak with a nurse.   For questions related to your [facility] health plan, please call: [phone] or go here:[email or web address]  If you would like to schedule transportation through your [facility] plan, please call the following number at least 2 days in advance of your appointment: [phone].   You can also use the MTM portal or MTM mobile app to manage your rides. Reimbursement for transportation is available through [facility]! For the portal, please go to [email or web address]  Call the [facility] Crisis Line at [phone], at any time, 24 hours a day, 7 days a week. If you are in danger or need immediate medical attention call 911.  If you would like help to quit smoking, call 1-800-QUIT-NOW (636 656 2193) OR Espaol: 1-855-Djelo-Ya (4-132-440-1027) o para ms informacin haga clic aqu or Text READY to 253-664 to register via text  [name],   Please see education materials related to heart healthy diet provided by MyChart link.  Patient verbalizes understanding of instructions and care plan provided today and agrees to view in MyChart. Active MyChart status and patient understanding of how to access instructions and care plan via MyChart confirmed with patient.     Telephone follow up appointment with Managed Medicaid care management team member scheduled for:05/07/23 at 9am  Holly Emms RN, BSN [place]  [facility]  Health RN Care Manager [phone]   Following is a copy of your plan of care:  Care Plan : RN Care Manager Plan of Care  Updates made by Holly Dach, RN since 04/03/2023 12:00 AM     Problem: Health Management needs related to QT Prolongation and Mental Health      Long-Range Goal: Development of Plan of Care to address Health Management needs related to QT Prolongation and Mental Health   Start Date: 03/26/2023  Expected End Date: 06/24/2023  Note:   Current Barriers:  Chronic Disease Management support and education needs related to QT Prolongation and Mental Health   RNCM Clinical Goal(s):  Patient will verbalize understanding of plan for management of QT Prolongation and Mental Health as evidenced by patient reports take all medications exactly as prescribed and will call provider for medication related questions as evidenced by Patient reports attend all scheduled medical appointments: LCSW on 04/05/23, BSW on 04/19/23, 05/03/23 with Cardiology and 05/22/23 with PCP as evidenced by provider documentation in EMR work with Child psychotherapist to address  related to the management of Financial constraints related to food, housing and utilities and Mental Health Concerns  related to the management of QT Prolongation as evidenced by review of EMR and patient or Child psychotherapist report through collaboration with Medical illustrator, provider, and care team.   Interventions: Evaluation of current treatment plan related to  self management and patient's adherence to plan as established by provider  Mental Health  (Status:  Goal on track:  Yes.)  Long Term Goal Evaluation of current treatment plan related to  Mental Health ,  self-management and patient's adherence to plan as established by provider.  Discussed plans with patient for ongoing care management follow up and provided patient with direct contact information for care management team Reviewed medications with patient and discussed taking all  medications to upcoming appointments Reviewed scheduled/upcoming provider appointments including LCSW on 04/05/23, BSW on 04/19/23, 05/03/23 with Cardiology and 05/22/23 with PCP  Social Work referral for Mental Health-scheduled with LCSW on 04/05/23 Assessed social determinant of health barriers Provided patient with Medicaid ID number, advised patient to provide [employer] with Medicaid ID number and schedule an appointment-referral was placed by PCP-patient has not followed up on this Reviewed member benefits provided by [facility], advised patient to contact member services [phone] to inquire Provided patient with [facility] Medical transportation [phone]   QT Prolongation  (Status:  Goal on track:  Yes.)  Long Term Goal Evaluation of current treatment plan related to  QT Prolongation ,  self-management and patient's adherence to plan as established by provider. Discussed plans with patient for ongoing care management follow up and provided patient with direct contact information for care management team Advised patient to take all medications to upcoming Cardiology appointment Reviewed medications with patient and discussed how to transfer all medications to one pharmacy and requesting 90 day refills for applicable medications Reviewed scheduled/upcoming provider appointments including 05/03/23 with Cardiology  Social Work referral for financial resources(food, rent and utilities)-scheduled on 04/19/23 Assessed social determinant of health barriers Advised patient to work on smoking cessation, education provided via MyChart  Patient Goals/Self-Care Activities: Take all medications as prescribed Attend all scheduled provider appointments Call provider office for new concerns or questions  Work with the social worker to address care coordination needs and will continue to work with the clinical team to address health care and disease management related needs  Follow Up Plan:  Telephone  follow up appointment with care management team member scheduled for:  05/07/23 at 9am

## 2023-04-03 NOTE — Patient Outreach (Signed)
 Medicaid Managed Care   Nurse Care Manager Note  04/03/2023 Name:  Holly Nicholson MRN:  [id number] DOB:  1985-10-10  Holly Nicholson is an 38 y.o. year old female who is a primary patient of Arnett, Galindez, DO.  The [facility] Managed Care Coordination team was consulted for assistance with:    QT Prolongation Mental Health  [name] was given information about Medicaid Managed Care Coordination team services today. [name] Patient agreed to services and verbal consent obtained.  Engaged with patient by telephone for follow up visit in response to provider referral for case management and/or care coordination services.   Patient is participating in a Managed Medicaid Plan:  Yes  Assessments/Interventions:  Review of past medical history, allergies, medications, health status, including review of consultants reports, laboratory and other test data, was performed as part of comprehensive evaluation and provision of chronic care management services.  SDOH (Social Drivers of Health) assessments and interventions performed: SDOH Interventions    Flowsheet Row Patient Outreach from 03/26/2023 in [place] HEALTH POPULATION HEALTH DEPARTMENT Office Visit from 03/09/2023 in [facility] Office Visit from 11/11/2021 in [facility] Office Visit from 02/10/2019 in [facility]  SDOH Interventions      Food Insecurity Interventions Other (Comment)  [BSW referral] -- -- --  Housing Interventions Other (Comment)  [BSW referral] -- -- --  Transportation Interventions Intervention Not Indicated -- -- --  Utilities Interventions Other (Comment)  [BSW referral] -- -- --  Depression Interventions/Treatment  -- Referral to Psychiatry, Counseling Medication Currently on Treatment       Care Plan  Allergies  Allergen Reactions   Onion Anaphylaxis, Swelling and Hives    Raw onions causes throat to swell   Latex Dermatitis     Medications Reviewed Today     Reviewed by Heidi Dach, RN (Registered Nurse) on 04/03/23 at 1201  Med List Status: <None>   Medication Order Taking? Sig Documenting Provider Last Dose Status Informant  aspirin EC 81 MG EC tablet [id number] Yes Take 1 tablet (81 mg total) by mouth daily. Swallow whole. Enedina Finner, MD Taking Active Pharmacy Records, Multiple Informants, Other  atorvastatin (LIPITOR) 10 MG tablet [id number] Yes Take 1 tablet (10 mg total) by mouth daily. Speirs, Megan P, DO Taking Active   lurasidone (LATUDA) 20 MG TABS tablet [id number] No TAKE 1 TABLET BY MOUTH EVERY DAY  Patient not taking: Reported on 04/03/2023   Dorcas Carrow, DO Not Taking Active            Med Note (Debi Cousin A   Tue Apr 03, 2023 11:57 AM) Waiting on prior authorization  nadolol (CORGARD) 40 MG tablet [id number] Yes Take 1 tablet (40 mg total) by mouth daily. Sherie Don, NP Taking Active   pantoprazole (PROTONIX) 40 MG tablet [id number] Yes Take 1 tablet (40 mg total) by mouth daily. Dorcas Carrow, DO Taking Active             Patient Active Problem List   Diagnosis Date Noted   B12 deficiency 11/14/2021   Migraine    QT prolongation 02/05/2021   TIA (transient ischemic attack) 02/04/2021   Essential hypertension 02/16/2019   MDD (major depressive disorder) 05/04/2018   S/P laparoscopic hysterectomy 10/30/2017   BMI 45.0-49.9, adult (HCC) 04/27/2016   Morbid obesity (HCC) 09/06/2014   Vitamin D deficiency 09/04/2014   Generalized anxiety disorder 08/20/2014   Bipolar affective disorder,  currently depressed, moderate (HCC) 08/20/2014   Tobacco abuse 08/20/2014    Conditions to be addressed/monitored per PCP order:   QT prolongation and Mental Health  Care Plan : RN Care Manager Plan of Care  Updates made by Heidi Dach, RN since 04/03/2023 12:00 AM     Problem: Health Management needs related to QT Prolongation and Mental Health      Long-Range Goal:  Development of Plan of Care to address Health Management needs related to QT Prolongation and Mental Health   Start Date: 03/26/2023  Expected End Date: 06/24/2023  Note:   Current Barriers:  Chronic Disease Management support and education needs related to QT Prolongation and Mental Health   RNCM Clinical Goal(s):  Patient will verbalize understanding of plan for management of QT Prolongation and Mental Health as evidenced by patient reports take all medications exactly as prescribed and will call provider for medication related questions as evidenced by Patient reports attend all scheduled medical appointments: LCSW on 04/05/23, BSW on 04/19/23, 05/03/23 with Cardiology and 05/22/23 with PCP as evidenced by provider documentation in EMR work with Child psychotherapist to address  related to the management of Financial constraints related to food, housing and utilities and Mental Health Concerns  related to the management of QT Prolongation as evidenced by review of EMR and patient or Child psychotherapist report through collaboration with Medical illustrator, provider, and care team.   Interventions: Evaluation of current treatment plan related to  self management and patient's adherence to plan as established by provider  Mental Health  (Status:  Goal on track:  Yes.)  Long Term Goal Evaluation of current treatment plan related to  Mental Health ,  self-management and patient's adherence to plan as established by provider. Discussed plans with patient for ongoing care management follow up and provided patient with direct contact information for care management team Reviewed medications with patient and discussed taking all medications to upcoming appointments Reviewed scheduled/upcoming provider appointments including LCSW on 04/05/23, BSW on 04/19/23, 05/03/23 with Cardiology and 05/22/23 with PCP  Social Work referral for Mental Health-scheduled with LCSW on 04/05/23 Assessed social determinant of health  barriers Provided patient with Medicaid ID number, advised patient to provide [employer] with Medicaid ID number and schedule an appointment-referral was placed by PCP-patient has not followed up on this Reviewed member benefits provided by [facility], advised patient to contact member services [phone] to inquire Provided patient with [facility] Medical transportation [phone]   QT Prolongation  (Status:  Goal on track:  Yes.)  Long Term Goal Evaluation of current treatment plan related to  QT Prolongation ,  self-management and patient's adherence to plan as established by provider. Discussed plans with patient for ongoing care management follow up and provided patient with direct contact information for care management team Advised patient to take all medications to upcoming Cardiology appointment Reviewed medications with patient and discussed how to transfer all medications to one pharmacy and requesting 90 day refills for applicable medications Reviewed scheduled/upcoming provider appointments including 05/03/23 with Cardiology  Social Work referral for financial resources(food, rent and utilities)-scheduled on 04/19/23 Assessed social determinant of health barriers Advised patient to work on smoking cessation, education provided via MyChart  Patient Goals/Self-Care Activities: Take all medications as prescribed Attend all scheduled provider appointments Call provider office for new concerns or questions  Work with the social worker to address care coordination needs and will continue to work with the clinical team to address health care and disease management related  needs  Follow Up Plan:  Telephone follow up appointment with care management team member scheduled for:  05/07/23 at 9am      Follow Up:  Patient agrees to Care Plan and Follow-up.  Plan: The Managed Medicaid care management team will reach out to the patient again over the next 30 days.  Date/time of next scheduled  RN care management/care coordination outreach:  05/07/23 at 9am  Estanislado Emms RN, BSN [place]  Value[facility] Care [facility] Health RN Care Manager [phone]

## 2023-04-05 ENCOUNTER — Ambulatory Visit: Payer: Self-pay | Admitting: *Deleted

## 2023-04-05 LAB — RPR W/REFLEX TO TREPSURE

## 2023-04-05 NOTE — Patient Outreach (Signed)
 Care Coordination   04/05/2023 Name: Holly Nicholson MRN: [id number] DOB: 03-03-1985   Care Coordination Outreach Attempts:  An unsuccessful telephone outreach was attempted today to offer the patient information about available complex care management services.  Follow Up Plan:  Additional outreach attempts will be made to offer the patient complex care management information and services.   Encounter Outcome:  No Answer   Care Coordination Interventions:  No, not indicated   Primus Gritton, LCSW [place]  [facility], [facility] Health Licensed Clinical Social Worker Care Coordinator  Direct Dial: [phone]

## 2023-04-06 LAB — COMPREHENSIVE METABOLIC PANEL WITH GFR
ALT: 28 IU/L (ref 0–32)
AST: 25 IU/L (ref 0–40)
Albumin: 4.1 g/dL (ref 3.9–4.9)
Alkaline Phosphatase: 119 IU/L (ref 44–121)
BUN/Creatinine Ratio: 6 — ABNORMAL LOW (ref 9–23)
BUN: 5 mg/dL — ABNORMAL LOW (ref 6–20)
Bilirubin Total: 1.1 mg/dL (ref 0.0–1.2)
CO2: 25 mmol/L (ref 20–29)
Calcium: 9 mg/dL (ref 8.7–10.2)
Chloride: 101 mmol/L (ref 96–106)
Creatinine, Ser: 0.84 mg/dL (ref 0.57–1.00)
Globulin, Total: 2.7 g/dL (ref 1.5–4.5)
Glucose: 89 mg/dL (ref 70–99)
Potassium: 4.1 mmol/L (ref 3.5–5.2)
Sodium: 140 mmol/L (ref 134–144)
Total Protein: 6.8 g/dL (ref 6.0–8.5)
eGFR: 91 mL/min/{1.73_m2} (ref >59)

## 2023-04-06 LAB — ACUTE VIRAL HEPATITIS (HAV, HBV, HCV)
HCV Ab: NONREACTIVE
Hep A IgM: NEGATIVE
Hep B C IgM: NEGATIVE
Hepatitis B Surface Ag: NEGATIVE

## 2023-04-06 LAB — CBC WITH DIFFERENTIAL/PLATELET
Basophils Absolute: 0 10*3/uL (ref 0.0–0.2)
Basos: 1 %
EOS (ABSOLUTE): 0.1 10*3/uL (ref 0.0–0.4)
Eos: 1 %
Hematocrit: 40.7 % (ref 34.0–46.6)
Hemoglobin: 13.8 g/dL (ref 11.1–15.9)
Immature Grans (Abs): 0 10*3/uL (ref 0.0–0.1)
Immature Granulocytes: 0 %
Lymphocytes Absolute: 1.1 10*3/uL (ref 0.7–3.1)
Lymphs: 21 %
MCH: 30.7 pg (ref 26.6–33.0)
MCHC: 33.9 g/dL (ref 31.5–35.7)
MCV: 91 fL (ref 79–97)
Monocytes Absolute: 0.3 10*3/uL (ref 0.1–0.9)
Monocytes: 6 %
Neutrophils Absolute: 3.7 10*3/uL (ref 1.4–7.0)
Neutrophils: 71 %
Platelets: 260 10*3/uL (ref 150–450)
RBC: 4.49 x10E6/uL (ref 3.77–5.28)
RDW: 13.5 % (ref 11.7–15.4)
WBC: 5.1 10*3/uL (ref 3.4–10.8)

## 2023-04-06 LAB — TREPONEMAL ANTIBODIES, TPPA: Treponemal Antibodies, TPPA: REACTIVE — AB

## 2023-04-06 LAB — LIPID PANEL W/O CHOL/HDL RATIO
Cholesterol, Total: 105 mg/dL (ref 100–199)
HDL: 35 mg/dL — ABNORMAL LOW (ref >39)
LDL Chol Calc (NIH): 40 mg/dL (ref 0–99)
Triglycerides: 184 mg/dL — ABNORMAL HIGH (ref 0–149)
VLDL Cholesterol Cal: 30 mg/dL (ref 5–40)

## 2023-04-06 LAB — RPR W/REFLEX TO TREPSURE

## 2023-04-06 LAB — HSV 1 AND 2 AB, IGG
HSV 1 Glycoprotein G Ab, IgG: REACTIVE — AB
HSV 2 IgG, Type Spec: NONREACTIVE

## 2023-04-06 LAB — TSH: TSH: 1.73 u[IU]/mL (ref 0.450–4.500)

## 2023-04-06 LAB — HCV INTERPRETATION

## 2023-04-06 LAB — HIV ANTIBODY (ROUTINE TESTING W REFLEX): HIV Screen 4th Generation wRfx: NONREACTIVE

## 2023-04-09 ENCOUNTER — Other Ambulatory Visit (HOSPITAL_COMMUNITY): Payer: Self-pay

## 2023-04-09 ENCOUNTER — Telehealth: Payer: Self-pay | Admitting: Pharmacy Technician

## 2023-04-09 ENCOUNTER — Other Ambulatory Visit: Payer: Self-pay | Admitting: Family Medicine

## 2023-04-09 ENCOUNTER — Telehealth: Payer: Self-pay

## 2023-04-09 DIAGNOSIS — R899 Unspecified abnormal finding in specimens from other organs, systems and tissues: Secondary | ICD-10-CM

## 2023-04-09 NOTE — Telephone Encounter (Signed)
 Noted! Thank you

## 2023-04-09 NOTE — Telephone Encounter (Signed)
 Received fax from pharmacy that patients Nadolol 40 mg tablets are not covered by patient plan.   Alternative is Nebivolol HCL, Propranolol HCL, Metoprolol Succinate, Metoprolol Tartrate.   Looks like we have completed a PA before - can we please review this, and do what is needed to complete.   Thanks!

## 2023-04-09 NOTE — Telephone Encounter (Signed)
 Pharmacy Patient Advocate Encounter   Received notification from Pt Calls Messages that prior authorization for Nadolol is required/requested.   Insurance verification completed.   The patient is insured through [facility] [place] .   Per test claim: PA required; PA submitted to above mentioned insurance via CoverMyMeds Key/confirmation #/EOC [id number] Status is pending

## 2023-04-12 NOTE — Telephone Encounter (Signed)
Additional information submitted

## 2023-04-13 NOTE — Telephone Encounter (Signed)
 You'd have to see if they'll do a peer-to-peer then arrange with Suzann for a time.

## 2023-04-13 NOTE — Telephone Encounter (Signed)
 Pharmacy Patient Advocate Encounter  Received notification from [facility] Medicaid that Prior Authorization for nadolol has been DENIED.  Full denial letter will be uploaded to the media tab. See denial reason below. Patient must have tried at least 2 of the preferred or have chart notes submitted that document why they cannot be tried in order for it to be approved    PA #/Case ID/Reference #: [id number]

## 2023-04-14 DIAGNOSIS — K219 Gastro-esophageal reflux disease without esophagitis: Secondary | ICD-10-CM | POA: Diagnosis not present

## 2023-04-14 DIAGNOSIS — I252 Old myocardial infarction: Secondary | ICD-10-CM | POA: Diagnosis not present

## 2023-04-14 DIAGNOSIS — Z833 Family history of diabetes mellitus: Secondary | ICD-10-CM | POA: Diagnosis not present

## 2023-04-14 DIAGNOSIS — Z9181 History of falling: Secondary | ICD-10-CM | POA: Diagnosis not present

## 2023-04-14 DIAGNOSIS — Z818 Family history of other mental and behavioral disorders: Secondary | ICD-10-CM | POA: Diagnosis not present

## 2023-04-14 DIAGNOSIS — Z7982 Long term (current) use of aspirin: Secondary | ICD-10-CM | POA: Diagnosis not present

## 2023-04-14 DIAGNOSIS — E785 Hyperlipidemia, unspecified: Secondary | ICD-10-CM | POA: Diagnosis not present

## 2023-04-14 DIAGNOSIS — Z8673 Personal history of transient ischemic attack (TIA), and cerebral infarction without residual deficits: Secondary | ICD-10-CM | POA: Diagnosis not present

## 2023-04-14 DIAGNOSIS — Z8249 Family history of ischemic heart disease and other diseases of the circulatory system: Secondary | ICD-10-CM | POA: Diagnosis not present

## 2023-04-14 DIAGNOSIS — F319 Bipolar disorder, unspecified: Secondary | ICD-10-CM | POA: Diagnosis not present

## 2023-04-17 ENCOUNTER — Other Ambulatory Visit (HOSPITAL_COMMUNITY): Payer: Self-pay

## 2023-04-19 ENCOUNTER — Ambulatory Visit: Payer: Self-pay

## 2023-04-21 DIAGNOSIS — Z419 Encounter for procedure for purposes other than remedying health state, unspecified: Secondary | ICD-10-CM | POA: Diagnosis not present

## 2023-04-26 ENCOUNTER — Telehealth: Payer: Self-pay | Admitting: *Deleted

## 2023-04-26 NOTE — Progress Notes (Signed)
 Complex Care Management Care Guide Note  04/26/2023 Name: Holly Nicholson MRN: [id number] DOB: [birth year]  Holly Nicholson is a 38 y.o. year old female who is a primary care patient of Fenix, Ruppe, DO and is actively engaged with the care management team. I reached out to Garvin Kaplan by phone today to assist with re-scheduling  with the Licensed Clinical Child psychotherapist.  Follow up plan: Unsuccessful telephone outreach attempt made. A HIPAA compliant phone message was left for the patient providing contact information and requesting a return call. No further outreach attempts will be made due to inability to maintain patient contact.   Kandis Ormond, CMA, [place]  [facility], [facility] Guide Direct Dial: [phone]  Fax: [phone] Website: [email or web address]

## 2023-05-03 ENCOUNTER — Ambulatory Visit: Attending: Internal Medicine | Admitting: Internal Medicine

## 2023-05-03 VITALS — BP 120/84 | HR 58 | Ht 60.0 in | Wt 242.0 lb

## 2023-05-03 DIAGNOSIS — E782 Mixed hyperlipidemia: Secondary | ICD-10-CM

## 2023-05-03 DIAGNOSIS — R9431 Abnormal electrocardiogram [ECG] [EKG]: Secondary | ICD-10-CM

## 2023-05-03 DIAGNOSIS — I10 Essential (primary) hypertension: Secondary | ICD-10-CM

## 2023-05-03 DIAGNOSIS — R072 Precordial pain: Secondary | ICD-10-CM

## 2023-05-03 DIAGNOSIS — Z87898 Personal history of other specified conditions: Secondary | ICD-10-CM

## 2023-05-03 NOTE — Patient Instructions (Signed)
 Medication Instructions:  The current medical regimen is effective;  continue present plan and medications.  *If you need a refill on your cardiac medications before your next appointment, please call your pharmacy*  Lab Work: Your provider would like for you to have following labs drawn today CBC, Platelet count.   If you have labs (blood work) drawn today and your tests are completely normal, you will receive your results only by: MyChart Message (if you have MyChart) OR A paper copy in the mail If you have any lab test that is abnormal or we need to change your treatment, we will call you to review the results.   Follow-Up: At [facility], you and your health needs are our priority.  As part of our continuing mission to provide you with exceptional heart care, our providers are all part of one team.  This team includes your primary Cardiologist (physician) and Advanced Practice Providers or APPs (Physician Assistants and Nurse Practitioners) who all work together to provide you with the care you need, when you need it.  Your next appointment:   12 month(s)  Provider:   Ardeen Kohler, MD or Suzann Riddle, NP    We recommend signing up for the patient portal called "MyChart".  Sign up information is provided on this After Visit Summary.  MyChart is used to connect with patients for Virtual Visits (Telemedicine).  Patients are able to view lab/test results, encounter notes, upcoming appointments, etc.  Non-urgent messages can be sent to your provider as well.   To learn more about what you can do with MyChart, go to [email or web address].   Other Instructions Look at [employer] [facility]com for cheaper options for medications.

## 2023-05-03 NOTE — Progress Notes (Signed)
 Patient Care Team: Solomon Dupre, DO as PCP - General (Family Medicine) Verona Goodwill, MD as PCP - Electrophysiology (Cardiology) Patient, No Pcp Per (General Practice) Ellie Gutta Care Management   HPI  JAQUELINE UBER is a 38 y.o. female Seen in follow-up following an admission for chest pain 1/23 with typical and atypical features that was associated for a short period of time with profound QT prolongation up to about 600 ms which then corrected over a number of hours to less than 450.  Her QTc in the emergency room had been about 460. No family history of syncope or sudden death Children without hx of syncope   She noted that her mother and father both had heart conditions, their charts were reviewed as she comes in today with some degree of asymmetric swelling which is actually longstanding.  Her mother was on Coumadin with a reported BMI of 70 and cor pulmonale.  Her father had left ventricular dysfunction.  Treadmill testing was associate with normal QT behaviors The patient denies chest pain, shortness of breath, nocturnal dyspnea, orthopnea or peripheral edema.  There have been no palpitations, lightheadedness or syncope.    Date Cr K Hgb  02/04/21 0.97 3.4 12.6  02/05/21 0.77 3.7    7/23 0.92 3.7 12.3  3/25 0.84 4.1 13.8     Records and Results Reviewed  yes  Past Medical History:  Diagnosis Date   Anxiety    BMI 45.0-49.9, adult (HCC)    Depression    Elevated blood pressure    GERD (gastroesophageal reflux disease)    History of bilateral tubal ligation 2007   History of bilateral tubal ligation 2007   Hypertension    Long Q-T syndrome    Low-lying placenta    Lower extremity edema    Tubal pregnancy 08/2014    Past Surgical History:  Procedure Laterality Date   ABDOMINAL HYSTERECTOMY     CHOLECYSTECTOMY N/A 09/02/2015   Procedure: LAPAROSCOPIC CHOLECYSTECTOMY;  Surgeon: Alben Alma, MD;  Location: [facility] ORS;  Service: General;   Laterality: N/A;   CHOLECYSTECTOMY     KNEE SURGERY     LAPAROSCOPIC HYSTERECTOMY Bilateral 10/30/2017   Procedure: HYSTERECTOMY TOTAL LAPAROSCOPIC-BILATERAL SALPINGECTOMY;  Surgeon: Darl Edu, MD;  Location: [facility] ORS;  Service: Gynecology;  Laterality: Bilateral;   LAPAROSCOPIC SALPINGO OOPHERECTOMY N/A 10/19/2016   Procedure: LAPAROSCOPIC SALPINGECTOMY bilateral;  Surgeon: Alben Alma, MD;  Location: [facility] ORS;  Service: Gynecology;  Laterality: N/A;   TUBAL LIGATION     TUBAL LIGATION Bilateral    Tubal reanastamosis      Current Meds  Medication Sig   aspirin  EC 81 MG EC tablet Take 1 tablet (81 mg total) by mouth daily. Swallow whole.   atorvastatin  (LIPITOR) 10 MG tablet Take 1 tablet (10 mg total) by mouth daily.   lurasidone  (LATUDA ) 20 MG TABS tablet TAKE 1 TABLET BY MOUTH EVERY DAY   nadolol  (CORGARD ) 40 MG tablet Take 1 tablet (40 mg total) by mouth daily.   pantoprazole  (PROTONIX ) 40 MG tablet Take 1 tablet (40 mg total) by mouth daily.    Allergies  Allergen Reactions   Onion Anaphylaxis, Swelling and Hives    Raw onions causes throat to swell   Latex Dermatitis      Review of Systems negative except from HPI and PMH  Physical Exam BP 120/84 (BP Location: Left Arm)   Pulse (!) 58   Ht 5' (1.524 m)  Wt 242 lb (109.8 kg)   LMP 08/13/2017 (Exact Date)   SpO2 98%   BMI 47.26 kg/m  Well developed and Morbidly obese  in no acute distress HENT normal Neck supple with JVP-flat Clear Regular rate and rhythm, no  gallop No murmur Abd-soft with active BS No Clubbing cyanosis  edema Skin-warm and dry A & Oriented  Grossly normal sensory and motor function  ECG sinus at 58 Intervals 13/08/42     CrCl cannot be calculated (Patient's most recent lab result is older than the maximum 21 days allowed.).   Assessment and  Plan QT prolongation-profound-transient  Chest pain with typical and atypical features   Right-sided numbness and vision  changes question migraine (prior history)   Tobacco use   Bipolar disorder   Probably should get genetic testing.  Will [facility] to [facility]  Nadolol  is available because close drugs website for 3 months for $20.  Transient profound QT prolongation without clear trigger.  Reviewed QT drugs.org website.

## 2023-05-04 ENCOUNTER — Other Ambulatory Visit: Payer: Self-pay

## 2023-05-04 LAB — CBC
Hematocrit: 39.8 % (ref 34.0–46.6)
Hemoglobin: 13.3 g/dL (ref 11.1–15.9)
MCH: 30.6 pg (ref 26.6–33.0)
MCHC: 33.4 g/dL (ref 31.5–35.7)
MCV: 92 fL (ref 79–97)
Platelets: 184 10*3/uL (ref 150–450)
RBC: 4.35 x10E6/uL (ref 3.77–5.28)
RDW: 13.3 % (ref 11.7–15.4)
WBC: 5.1 10*3/uL (ref 3.4–10.8)

## 2023-05-04 NOTE — Patient Instructions (Signed)
 Visit Information  [name] was given information about Medicaid Managed Care team care coordination services as a part of their [facility] Medicaid benefit. Holly Nicholson verbally consented to engagement with the [facility] Managed Care team.   If you are experiencing a medical emergency, please call 911 or report to your local emergency department or urgent care.   If you have a non-emergency medical problem during routine business hours, please contact your provider's office and ask to speak with a nurse.   For questions related to your [facility] health plan, please call: [phone] or go here:[email or web address]  If you would like to schedule transportation through your [facility] plan, please call the following number at least 2 days in advance of your appointment: [phone].   You can also use the MTM portal or MTM mobile app to manage your rides. Reimbursement for transportation is available through [facility]! For the portal, please go to [email or web address]  Call the [facility] Crisis Line at [phone], at any time, 24 hours a day, 7 days a week. If you are in danger or need immediate medical attention call 911.  If you would like help to quit smoking, call 1-800-QUIT-NOW (970-815-6306) OR Espaol: 1-855-Djelo-Ya (4-132-440-1027) o para ms informacin haga clic aqu or Text READY to 253-664 to register via text  [name] - following are the goals we discussed in your visit today:   Goals Addressed   None      The  Patient                                              has been provided with contact information for the Managed Medicaid care management team and has been advised to call with any health related questions or concerns.   Holly Nicholson, Holly Nicholson, MHA [place]  [facility] Social Worker, Population Health [phone]   Following is a copy of your plan of care:  There are no care plans that you recently  modified to display for this patient.

## 2023-05-04 NOTE — Patient Outreach (Signed)
 Complex Care Management   Visit Note  05/04/2023  Name:  Holly Nicholson MRN: [id number] DOB: 03-29-1985  Situation: Referral received for Complex Care Management related to SDOH Barriers:  Housing rent Lack of essential utilities utiltiy bill and Mental health resources Patient did not want a referral to LCSW I obtained verbal consent from Patient.  Visit completed with patient  on the phone  Background:   Past Medical History:  Diagnosis Date   Anxiety    BMI 45.0-49.9, adult (HCC)    Depression    Elevated blood pressure    GERD (gastroesophageal reflux disease)    History of bilateral tubal ligation 2007   History of bilateral tubal ligation 2007   Hypertension    Long Q-T syndrome    Low-lying placenta    Lower extremity edema    Tubal pregnancy 08/2014    Assessment: SW completed a telephone outreach with patient. She states she is not sure if she received the resources sent via email. SW verified email and resent while patient was on the phone. Patient reports she is still behind on her rent and does not have an eviction notice. Patient states no additional resources are needed at this time.    Recommendation:   No recommendations at this time  Follow Up Plan:   Patient has met all care management goals. Care Management case will be closed. Patient has been provided contact information should new needs arise.   Valora Gear, Florestine Hurl, MHA [place]  [facility] Social Worker, Population Health [phone]

## 2023-05-07 ENCOUNTER — Other Ambulatory Visit: Payer: Self-pay

## 2023-05-15 ENCOUNTER — Telehealth: Payer: Self-pay | Admitting: *Deleted

## 2023-05-16 NOTE — Progress Notes (Signed)
 Complex Care Management Care Guide Note  05/16/2023 Name: Holly Nicholson MRN: [id number] DOB: [birth year]  Holly Nicholson is a 38 y.o. year old female who is a primary care patient of [name], [name], DO and is actively engaged with the care management team. I reached out to Garvin Kaplan by phone today to assist with re-scheduling  with the Licensed Clinical Child psychotherapist.  Follow up plan: Telephone appointment with complex care management team member scheduled for:  5/28  Barnie Bora  [facility] Health  [facility] Guide  Direct Dial: [phone]  Fax [phone]

## 2023-05-21 ENCOUNTER — Telehealth: Payer: Self-pay | Admitting: *Deleted

## 2023-05-21 DIAGNOSIS — Z419 Encounter for procedure for purposes other than remedying health state, unspecified: Secondary | ICD-10-CM | POA: Diagnosis not present

## 2023-05-22 ENCOUNTER — Ambulatory Visit (INDEPENDENT_AMBULATORY_CARE_PROVIDER_SITE_OTHER): Admitting: Family Medicine

## 2023-05-22 VITALS — BP 123/80 | HR 66 | Temp 97.9°F | Ht 60.0 in | Wt 239.4 lb

## 2023-05-22 DIAGNOSIS — J209 Acute bronchitis, unspecified: Secondary | ICD-10-CM | POA: Diagnosis not present

## 2023-05-22 DIAGNOSIS — F3132 Bipolar disorder, current episode depressed, moderate: Secondary | ICD-10-CM

## 2023-05-22 DIAGNOSIS — I10 Essential (primary) hypertension: Secondary | ICD-10-CM

## 2023-05-22 DIAGNOSIS — R9431 Abnormal electrocardiogram [ECG] [EKG]: Secondary | ICD-10-CM | POA: Diagnosis not present

## 2023-05-22 DIAGNOSIS — J029 Acute pharyngitis, unspecified: Secondary | ICD-10-CM

## 2023-05-22 MED ORDER — PREDNISONE 10 MG PO TABS: ORAL_TABLET | ORAL | 0 refills | Status: DC

## 2023-05-22 MED ORDER — NADOLOL 40 MG PO TABS: 40.0000 mg | ORAL_TABLET | Freq: Every day | ORAL | 3 refills | Status: DC

## 2023-05-22 MED ORDER — AZITHROMYCIN 250 MG PO TABS: ORAL_TABLET | ORAL | 0 refills | Status: DC

## 2023-05-22 MED ORDER — LURASIDONE HCL 20 MG PO TABS: 20.0000 mg | ORAL_TABLET | Freq: Every day | ORAL | 3 refills | Status: DC

## 2023-05-22 NOTE — Assessment & Plan Note (Signed)
 Continue to follow with cardiology. Avoid QT prolonging medication.

## 2023-05-22 NOTE — Patient Instructions (Signed)
 Sign up for the nadolol  through [facility] for $4

## 2023-05-22 NOTE — Assessment & Plan Note (Signed)
 Has not been able to get her latuda  for unknown reason as it's a preferred drug on medicaid. Will get pharmacy involved. Call with any concerns.

## 2023-05-22 NOTE — Assessment & Plan Note (Signed)
 Under good control. Has not been able to get her nadolol  as it's not preferred. Will try PA again and will get her good RX card to get medicine less expensive.

## 2023-05-22 NOTE — Progress Notes (Signed)
 BP 123/80 (BP Location: Left Arm, Patient Position: Sitting, Cuff Size: Large)   Pulse 66   Temp 97.9 F (36.6 C) (Oral)   Ht 5' (1.524 m)   Wt 239 lb 6.4 oz (108.6 kg)   LMP 08/13/2017 (Exact Date)   SpO2 96%   BMI 46.75 kg/m    Subjective:    Patient ID: Holly Nicholson, female    DOB: [birth year], 38 y.o.   MRN: [id number]  HPI: Holly Nicholson is a 38 y.o. female  Chief Complaint  Patient presents with   Cough    Pt complains of onset about 3 days ago. Dry cough. Running nose. Denies fever.     Shortness of Breath   BIPOLAR Mood status: uncontrolled Satisfied with current treatment?: no Symptom severity: moderate  Duration of current treatment : has not been on anything Side effects: no Medication compliance: poor compliance Psychotherapy/counseling: no  Previous psychiatric medications: seroquel  Depressed mood: yes Anxious mood: yes Anhedonia: no Significant weight loss or gain: no Insomnia: no  Fatigue: yes Feelings of worthlessness or guilt: yes Impaired concentration/indecisiveness: no Suicidal ideations: no Hopelessness: no Crying spells: no    03/09/2023    3:25 PM 12/09/2021    4:55 PM 11/11/2021    3:50 PM 02/10/2019    4:55 PM 07/10/2017   10:05 AM  Depression screen PHQ 2/9  Decreased Interest 3 1 1 1 2   Down, Depressed, Hopeless 3 1 1 2 2   PHQ - 2 Score 6 2 2 3 4   Altered sleeping 3 1 0 3 3  Tired, decreased energy 3 1 1 2 3   Change in appetite 3 3 3 3 3   Feeling bad or failure about yourself  2 1 1 2 3   Trouble concentrating 2 3 3 3 3   Moving slowly or fidgety/restless 2 2 3 2 3   Suicidal thoughts 1 0 0 0 0  PHQ-9 Score 22 13 13 18 22   Difficult doing work/chores  Somewhat difficult      UPPER RESPIRATORY TRACT INFECTION Duration: 3 days Worst symptom: Fever: no Cough: yes Shortness of breath: yes Wheezing: yes Chest pain: no Chest tightness: yes Chest congestion: no Nasal congestion: yes Runny nose: yes Post nasal drip:  yes Sneezing: no Sore throat: yes Swollen glands: no Sinus pressure: no Headache: yes Face pain: no Toothache: no Ear pain: no  Ear pressure: no  Eyes red/itching:no Eye drainage/crusting: no  Vomiting: no Rash: no Fatigue: yes Sick contacts: no Strep contacts: no  Context: worse Recurrent sinusitis: no Relief with OTC cold/cough medications: no  Treatments attempted: tylenol     Relevant past medical, surgical, family and social history reviewed and updated as indicated. Interim medical history since our last visit reviewed. Allergies and medications reviewed and updated.  Review of Systems  Per HPI unless specifically indicated above     Objective:     BP 123/80 (BP Location: Left Arm, Patient Position: Sitting, Cuff Size: Large)   Pulse 66   Temp 97.9 F (36.6 C) (Oral)   Ht 5' (1.524 m)   Wt 239 lb 6.4 oz (108.6 kg)   LMP 08/13/2017 (Exact Date)   SpO2 96%   BMI 46.75 kg/m   Wt Readings from Last 3 Encounters:  05/22/23 239 lb 6.4 oz (108.6 kg)  05/03/23 242 lb (109.8 kg)  03/09/23 252 lb 9.6 oz (114.6 kg)    Physical Exam Vitals and nursing note reviewed.  Constitutional:      General: She is  not in acute distress.    Appearance: Normal appearance. She is obese. She is not ill-appearing, toxic-appearing or diaphoretic.  HENT:     Head: Normocephalic and atraumatic.     Right Ear: Tympanic membrane, ear canal and external ear normal.     Left Ear: Tympanic membrane, ear canal and external ear normal.     Nose: Rhinorrhea present. No congestion.     Mouth/Throat:     Mouth: Mucous membranes are moist.     Pharynx: Oropharynx is clear. Posterior oropharyngeal erythema present.  Eyes:     General: No scleral icterus.       Right eye: No discharge.        Left eye: No discharge.     Extraocular Movements: Extraocular movements intact.     Conjunctiva/sclera: Conjunctivae normal.     Pupils: Pupils are equal, round, and reactive to light.   Cardiovascular:     Rate and Rhythm: Normal rate and regular rhythm.     Pulses: Normal pulses.     Heart sounds: Normal heart sounds. No murmur heard.    No friction rub. No gallop.  Pulmonary:     Effort: Pulmonary effort is normal. No respiratory distress.     Breath sounds: No stridor. Wheezing present. No rhonchi or rales.  Chest:     Chest wall: No tenderness.  Musculoskeletal:        General: Normal range of motion.     Cervical back: Normal range of motion and neck supple.  Skin:    General: Skin is warm and dry.     Capillary Refill: Capillary refill takes less than 2 seconds.     Coloration: Skin is not jaundiced or pale.     Findings: No bruising, erythema, lesion or rash.  Neurological:     General: No focal deficit present.     Mental Status: She is alert and oriented to person, place, and time. Mental status is at baseline.  Psychiatric:        Mood and Affect: Mood normal.        Behavior: Behavior normal.        Thought Content: Thought content normal.        Judgment: Judgment normal.     Results for orders placed or performed in visit on 05/03/23  CBC   Collection Time: 05/03/23 11:16 AM  Result Value Ref Range   WBC 5.1 3.4 - 10.8 x10E3/uL   RBC 4.35 3.77 - 5.28 x10E6/uL   Hemoglobin 13.3 11.1 - 15.9 g/dL   Hematocrit 86.5 78.4 - 46.6 %   MCV 92 79 - 97 fL   MCH 30.6 26.6 - 33.0 pg   MCHC 33.4 31.5 - 35.7 g/dL   RDW 69.6 29.5 - 28.4 %   Platelets 184 150 - 450 x10E3/uL      Assessment & Plan:   Problem List Items Addressed This Visit       Cardiovascular and Mediastinum   Essential hypertension   Under good control. Has not been able to get her nadolol  as it's not preferred. Will try PA again and will get her good RX card to get medicine less expensive.       Relevant Medications   nadolol  (CORGARD ) 40 MG tablet     Other   Bipolar affective disorder, currently depressed, moderate (HCC) - Primary   Has not been able to get her latuda  for  unknown reason as it's a preferred drug on medicaid. Will get  pharmacy involved. Call with any concerns.       Relevant Orders   AMB Referral VBCI Care Management   QT prolongation   Continue to follow with cardiology. Avoid QT prolonging medication.       Relevant Orders   AMB Referral VBCI Care Management   Other Visit Diagnoses       Sore throat       Strep negative. Will treat for bronchitis.   Relevant Orders   Rapid Strep Screen ([facility] ONLY)     Acute bronchitis, unspecified organism       Will treat with prednisone  and azithromycin . Call with any concerns.        Follow up plan: Return in about 6 weeks (around 07/03/2023).

## 2023-05-24 ENCOUNTER — Telehealth: Payer: Self-pay

## 2023-05-24 ENCOUNTER — Other Ambulatory Visit: Payer: Self-pay | Admitting: Family Medicine

## 2023-05-24 NOTE — Telephone Encounter (Signed)
 Copied from CRM [id number]. Topic: Clinical - Prescription Issue >> May 24, 2023 10:53 AM Chrystal Crape R wrote: Pt needs medication sent to another pharmacy as the previous pharmacy does not have them in stock.  1. predniSONE  (DELTASONE ) 10 MG tablet 2. azithromycin  (ZITHROMAX ) 250 MG tablet  [facility]  [facility] [address] [place], [place] [address]

## 2023-05-24 NOTE — Progress Notes (Signed)
 Care Guide Pharmacy Note  05/24/2023 Name: Holly Nicholson MRN: [id number] DOB: 1985-02-23  Referred By: Solomon Dupre, DO Reason for referral: Complex Care Management (Outreach to schedule with Pharmd )   Holly Nicholson is a 38 y.o. year old female who is a primary care patient of Milah, Siebel, DO.  Holly Nicholson was referred to the pharmacist for assistance related to: Bipolar Disorder  An unsuccessful telephone outreach was attempted today to contact the patient who was referred to the pharmacy team for assistance with medication assistance. Additional attempts will be made to contact the patient.  Lenton Rail , RMA     [facility]  [facility], [facility] Guide  Direct Dial: [phone]  Website: [email or web address]

## 2023-05-24 NOTE — Progress Notes (Signed)
 Care Guide Pharmacy Note  05/24/2023 Name: Holly Nicholson MRN: [id number] DOB: Feb 07, 1985  Referred By: Solomon Dupre, DO Reason for referral: Complex Care Management (Outreach to schedule with Pharmd )   Holly Nicholson is a 38 y.o. year old female who is a primary care patient of Yazmina, Janosko, DO.  Garvin Kaplan was referred to the pharmacist for assistance related to: Bipolar Disorder  Successful contact was made with the patient to discuss pharmacy services including being ready for the pharmacist to call at least 5 minutes before the scheduled appointment time and to have medication bottles and any blood pressure readings ready for review. The patient agreed to meet with the pharmacist via telephone visit on (date/time).05/25/2023  Lenton Rail , RMA     [employer]up  [facility], [facility] Guide  Direct Dial: [phone]  Website: [email or web address]

## 2023-05-25 ENCOUNTER — Other Ambulatory Visit: Payer: Self-pay

## 2023-05-25 LAB — RAPID STREP SCREEN (MED CTR MEBANE ONLY): Strep Gp A Ag, IA W/Reflex: NEGATIVE

## 2023-05-25 LAB — CULTURE, GROUP A STREP

## 2023-05-25 MED ORDER — AZITHROMYCIN 250 MG PO TABS: ORAL_TABLET | ORAL | 0 refills | Status: AC

## 2023-05-25 MED ORDER — PREDNISONE 10 MG PO TABS: ORAL_TABLET | ORAL | 0 refills | Status: DC

## 2023-05-25 MED ORDER — LURASIDONE HCL 20 MG PO TABS: 20.0000 mg | ORAL_TABLET | Freq: Every day | ORAL | 3 refills | Status: DC

## 2023-05-25 NOTE — Progress Notes (Signed)
   05/25/2023  Patient ID: Holly Nicholson, female   DOB: 03/21/1985, 38 y.o.   MRN: [id number]  Subjective/Objective Patient outreach to assist with access/affordability of medications in response to referral placed by patient's PCP, Dr. Lincoln Nicholson  Medication Access -Patient endorses recent difficulty in obtaining Laturda 20mg  and nadolol  40mg  daily -Also recently prescribed azithromycin  and prednisone , and [facility] states they did not receive these medications; so patient has not been able to start treatment.  [facility] -Patient is currently enrolled in Medicaid, which should cover Latuda  but is requiring a prior authorization for nadolol  -Patient has nadalol on hand currently, but is in need of Latuda   Assessment/Plan  Medication Access -Dr. Lincoln Nicholson signed new orders for azithromycin , prednisone , and Latuda  [facility] [place] -[facility]ed [facility] to verify [facility] have been received and are going through insurance appropriately- Prednisone  10mg , Azithromycin  250mg , and Latuda  40mg  are all going through for $4/each on Medicaid -Prior authorization for nadolol  previously denied because patient has not tried/failed 2 preferred formulary alternatives.  I recommend using GoodRx coupon to obtain medication if able ([facility] cost with GoodRx approximately $13/month or [facility] cost reflects $21.23 for 90 days), or we could consider preferred alternative (metoprolol , propranolol, atenolol, labetalol, nebivolol, carvedilol). -Informed patient that name still appears to be "Holly Nicholson" with Medicaid, and I recommend contacting them to update to prevent possible future billing issues  Follow-up:  Sending patient MyChart message with information and my direct phone number, so I can further assist with nadolol  and possibly transferring medications to [facility]   Linn Rich, PharmD, DPLA

## 2023-05-28 ENCOUNTER — Other Ambulatory Visit: Payer: Self-pay

## 2023-06-06 ENCOUNTER — Encounter: Payer: Self-pay | Admitting: *Deleted

## 2023-06-06 NOTE — Progress Notes (Signed)
 This encounter was created in error - please disregard.

## 2023-06-19 ENCOUNTER — Encounter: Payer: Self-pay | Admitting: Family Medicine

## 2023-06-21 DIAGNOSIS — Z419 Encounter for procedure for purposes other than remedying health state, unspecified: Secondary | ICD-10-CM | POA: Diagnosis not present

## 2023-07-03 ENCOUNTER — Telehealth: Payer: Self-pay | Admitting: *Deleted

## 2023-07-03 NOTE — Progress Notes (Unsigned)
 Complex Care Management Care Guide Note  07/03/2023 Name: VENESSA WICKHAM MRN: [id number] DOB: 01/10/1986  KOLBY MYUNG is a 38 y.o. year old female who is a primary care patient of Keshona, Kartes, DO and is actively engaged with the care management team. I reached out to Rock JONELLE Louder by phone today to assist with re-scheduling  with the Licensed Clinical Child psychotherapist.  Follow up plan: Unsuccessful telephone outreach attempt made. A HIPAA compliant phone message was left for the patient providing contact information and requesting a return call.  Thedford Franks, CMA [place]  [facility], [facility] Guide Direct Dial: [phone]  Fax: [phone] Website: [email or web address]

## 2023-07-04 NOTE — Progress Notes (Signed)
 Complex Care Management Care Guide Note  07/04/2023 Name: KEWANDA POLAND MRN: [id number] DOB: 1985/01/15  CAILEN TEXEIRA is a 38 y.o. year old female who is a primary care patient of Jessice, Madill, DO and is actively engaged with the care management team. I reached out to Rock JONELLE Louder by phone today to assist with re-scheduling  with the Licensed Clinical Child psychotherapist.  Follow up plan: pt will call back after she gets work schedule next week   Thedford Franks, CMA [facility] Health  [facility], [facility] Guide Direct Dial: [phone]  Fax: [phone] Website: [email or web address]

## 2023-07-05 ENCOUNTER — Ambulatory Visit: Admitting: Pediatrics

## 2023-07-12 ENCOUNTER — Other Ambulatory Visit: Payer: Self-pay

## 2023-07-13 ENCOUNTER — Other Ambulatory Visit: Payer: Self-pay

## 2023-07-13 NOTE — Patient Outreach (Signed)
 Complex Care Management   Visit Note    Name:  Holly Nicholson MRN: [id number] DOB: 11-Jun-1985  Situation: Referral received for Complex Care Management related to Hypertension I obtained verbal consent from Patient.  Visit completed with Holly Nicholson via telephone.  Background:   Past Medical History:  Diagnosis Date   Anxiety    BMI 45.0-49.9, adult (HCC)    Depression    Elevated blood pressure    GERD (gastroesophageal reflux disease)    History of bilateral tubal ligation 2007   History of bilateral tubal ligation 2007   Hypertension    Long Q-T syndrome    Low-lying placenta    Lower extremity edema    Tubal pregnancy 08/2014    Assessment: Patient Reported Symptoms:  Cognitive Cognitive Status: Alert and oriented to person, place, and time, Normal speech and language skills Cognitive/Intellectual Conditions Management [RPT]: None reported or documented in medical history or problem list Health Maintenance Behaviors: Annual physical exam, Exercise Healing Pattern: Average Health Facilitated by: Rest  Neurological Neurological Review of Symptoms: No symptoms reported Neurological Management Strategies: Routine screening Neurological Self-Management Outcome: 4 (good)  HEENT HEENT Symptoms Reported: No symptoms reported HEENT Management Strategies: Routine screening HEENT Self-Management Outcome: 4 (good)  Cardiovascular Cardiovascular Symptoms Reported: No symptoms reported Does patient have uncontrolled Hypertension?: No Cardiovascular Management Strategies: Medication therapy, Routine screening, Coping strategies, Exercise Cardiovascular Self-Management Outcome: 4 (good)  Respiratory Respiratory Symptoms Reported: No symptoms reported Respiratory Management Strategies: Routine screening Respiratory Self-Management Outcome: 4 (good)  Endocrine Endocrine Symptoms Reported: No symptoms reported Is patient diabetic?: No Endocrine Self-Management Outcome: 4 (good)   Gastrointestinal Gastrointestinal Symptoms Reported: No symptoms reported Gastrointestinal Self-Management Outcome: 4 (good)  Genitourinary Genitourinary Symptoms Reported: No symptoms reported Genitourinary Self-Management Outcome: 4 (good)  Integumentary Integumentary Symptoms Reported: No symptoms reported Skin Management Strategies: Routine screening Skin Self-Management Outcome: 4 (good)  Musculoskeletal Musculoskelatal Symptoms Reviewed: No symptoms reported Musculoskeletal Management Strategies: Routine screening Musculoskeletal Self-Management Outcome: 4 (good) Falls in the past year?: No Number of falls in past year: 1 or less Was there an injury with Fall?: No Fall Risk Category Calculator: 0 Patient Fall Risk Level: Low Fall Risk Patient at Risk for Falls Due to: Medication side effect Fall risk Follow up: Falls prevention discussed  Psychosocial Psychosocial Symptoms Reported: No symptoms reported Additional Psychological Details: Reports currently doing well, however she has been unable to receive latuda  due to the medication not being covered by her Medicaid plan. PCP is aware and placed referral for assistance from the Embedded Pharmacist. Behavioral Management Strategies: Adequate rest, Counseling, Support system (Pending options for medication) Behavioral Health Self-Management Outcome: 4 (good) Major Change/Loss/Stressor/Fears (CP): Denies Behaviors When Feeling Stressed/Fearful: Rest Techniques to Cope with Loss/Stress/Change:  (Rest, pending plan for medications) Quality of Family Relationships: supportive Do you feel physically threatened by others?: No      07/12/2023    1:18 PM  Depression screen PHQ 2/9  Decreased Interest 0  Down, Depressed, Hopeless 0  PHQ - 2 Score 0    There were no vitals filed for this visit.  Medications Reviewed Today     Reviewed by Karoline Lima, RN (Registered Nurse) on 07/12/23 at [phone]  Med List Status: <None>    Medication Order Taking? Sig Documenting Provider Last Dose Status Informant  aspirin  EC 81 MG EC tablet [id number] Yes Take 1 tablet (81 mg total) by mouth daily. Swallow whole. Tobie Calix, MD  Active Pharmacy Records, Multiple Informants, Other  atorvastatin  (LIPITOR)  10 MG tablet [id number] Yes Take 1 tablet (10 mg total) by mouth daily. Metheny, Megan P, DO  Active   lurasidone  (LATUDA ) 20 MG TABS tablet [id number]  Take 1 tablet (20 mg total) by mouth daily.  Patient not taking: Reported on 07/12/2023   Vicci Duwaine SQUIBB, DO  Active   nadolol  (CORGARD ) 40 MG tablet [id number] Yes Take 1 tablet (40 mg total) by mouth daily. Cliff, Megan P, DO  Active   pantoprazole  (PROTONIX ) 40 MG tablet [id number] Yes Take 1 tablet (40 mg total) by mouth daily. Loftus, Megan P, DO  Active   predniSONE  (DELTASONE ) 10 MG tablet [id number]  6 tabs in the AM day 1, 5 tabs day 2, decrease by 1 daily until gone  Patient not taking: Reported on 07/12/2023   Vicci Duwaine SQUIBB, DO  Active             Recommendation:   Advised to schedule follow up with PCP due to missing appointment on July 05, 2023 Declined offer to schedule PCP appointment today. Agreed to schedule after confirming availability.   Follow Up Plan:   Telephone follow up appointment Nurse Case Manager on July 26, 2023   Jackson Acron [facility] Health RN Care Manager Direct Dial: [phone]  Fax: [phone] Website: [email or web address]

## 2023-07-13 NOTE — Patient Instructions (Addendum)
 Visit Information  Ms. Burnsed was given information about Medicaid Managed Care team care coordination services as a part of their [facility] Medicaid benefit. Rock JONELLE Vicci verbally consented to engagement with the [facility] Managed Care team.   If you are experiencing a medical emergency, please call 911 or report to your local emergency department or urgent care.   If you have a non-emergency medical problem during routine business hours, please contact your provider's office and ask to speak with a nurse.   For questions related to your [facility] health plan, please call: [phone] or go here:[email or web address]  If you would like to schedule transportation through your [facility] plan, please call the following number at least 2 days in advance of your appointment: [phone].   You can also use the MTM portal or MTM mobile app to manage your rides. Reimbursement for transportation is available through [facility]! For the portal, please go to [email or web address]  Call the [facility] Crisis Line at [phone], at any time, 24 hours a day, 7 days a week. If you are in danger or need immediate medical attention call 911.  If you would like help to quit smoking, call 1-800-QUIT-NOW (7810102249) OR Espaol: 1-855-Djelo-Ya (8-144-664-6430) o para ms informacin haga clic aqu or Text READY to 799-599 to register via text  Ms. Fletes - following are the goals we discussed in your visit today:   Goals Addressed             This Visit's Progress    VBCI RNCM:  Monitor, Self-Manage And Reduce Symptoms of Hypertension       Problems:  Chronic Disease Management support and education needs related to Hypertension   Goal: Over the next 3 months the patient will: Attend all scheduled medical appointments Take all medications exactly as prescribed  Demonstrate ongoing adherence to prescribed treatment plan for Hypertension  Hypertension:Interventions  (Status: Goal on track  ) Long Term Goal  Reviewed current treatment plan, self-management, and adherence to plan as established by provider.  Provided education regarding importance of taking medications as prescribed. Discussed indications for use.  Provided education regarding established blood pressure parameters along with indications for notifying a provider.   Provided education regarding signs and symptoms of hypotension and hypertension  Reviewed symptoms. Denies chest pain or palpitations. Denies headaches, lightheadedness, dizziness or visual changes. Reports experiencing lower extremity edema within the last month. Reports currently wearing compression hose and keeping extremities elevated when sitting for prolonged periods. She was scheduled for clinic evaluation in June but was unable to attend the appointment. Declined offer to schedule PCP appointment today. Agreed to call once her availability is confirmed.  Provided information regarding recommended cardiac prudent diet. Reports monitoring nutritional intake as advised. Encouraged to continue reading nutrition labels, continue monitoring sodium intake, and avoid highly processed foods when possible.  Reviewed signs and symptoms of heart attack, stroke and worsening symptoms that require immediate medical attention.   Last practice recorded BP readings:  BP Readings from Last 3 Encounters:  05/22/23 123/80  05/03/23 120/84  03/30/23 126/82   Most recent eGFR/CrCl:  Lab Results  Component Value Date   EGFR 91 03/30/2023    No components found for: CRCL    Patient Self-Care Activities:  Take all medications as prescribed Attend all scheduled provider appointments Call pharmacy for medication refills 3-7 days in advance of running out of medications Check blood pressure and keep a log Call doctor for blood pressures outside of established range Read nutrition  labels. Eat whole grains, fruits and vegetables, lean meats  and healthy fats Limit salt intake  Follow recommended safety and fall prevention measures Call provider office for new concerns or questions    Plan:  Telephone follow up appointment with on July 26, 2023             Please see education materials related to Hypertension and Preventing Heat Exhaustion provided by MyChart link.  Patient verbalizes understanding of instructions and care plan provided today and agrees to view in MyChart. Active MyChart status and patient understanding of how to access instructions and care plan via MyChart confirmed with patient.     RN Care Manager will follow up on July 26, 2023 at 1100    [facility] Health RN Care Manager Direct Dial: [phone]  Fax: [phone] Website: [email or web address]

## 2023-07-19 ENCOUNTER — Emergency Department: Admission: EM | Admit: 2023-07-19 | Discharge: 2023-07-19 | Disposition: A | Discharge status: Home / Self Care

## 2023-07-19 ENCOUNTER — Emergency Department

## 2023-07-19 ENCOUNTER — Telehealth: Payer: Self-pay | Admitting: Internal Medicine

## 2023-07-19 ENCOUNTER — Other Ambulatory Visit: Payer: Self-pay

## 2023-07-19 DIAGNOSIS — I10 Essential (primary) hypertension: Secondary | ICD-10-CM | POA: Diagnosis not present

## 2023-07-19 DIAGNOSIS — X503XXA Overexertion from repetitive movements, initial encounter: Secondary | ICD-10-CM | POA: Diagnosis not present

## 2023-07-19 DIAGNOSIS — S161XXA Strain of muscle, fascia and tendon at neck level, initial encounter: Secondary | ICD-10-CM | POA: Diagnosis not present

## 2023-07-19 DIAGNOSIS — M5412 Radiculopathy, cervical region: Secondary | ICD-10-CM | POA: Insufficient documentation

## 2023-07-19 DIAGNOSIS — R001 Bradycardia, unspecified: Secondary | ICD-10-CM | POA: Diagnosis not present

## 2023-07-19 DIAGNOSIS — T148XXA Other injury of unspecified body region, initial encounter: Secondary | ICD-10-CM

## 2023-07-19 DIAGNOSIS — S199XXA Unspecified injury of neck, initial encounter: Secondary | ICD-10-CM | POA: Diagnosis present

## 2023-07-19 DIAGNOSIS — Y990 Civilian activity done for income or pay: Secondary | ICD-10-CM | POA: Diagnosis not present

## 2023-07-19 DIAGNOSIS — Z0189 Encounter for other specified special examinations: Secondary | ICD-10-CM | POA: Diagnosis not present

## 2023-07-19 LAB — CBC
HCT: 37.9 % (ref 36.0–46.0)
Hemoglobin: 13.2 g/dL (ref 12.0–15.0)
MCH: 31 pg (ref 26.0–34.0)
MCHC: 34.8 g/dL (ref 30.0–36.0)
MCV: 89 fL (ref 80.0–100.0)
Platelets: 175 K/uL (ref 150–400)
RBC: 4.26 MIL/uL (ref 3.87–5.11)
RDW: 13.3 % (ref 11.5–15.5)
WBC: 6.3 K/uL (ref 4.0–10.5)
nRBC: 0 % (ref 0.0–0.2)

## 2023-07-19 LAB — BASIC METABOLIC PANEL WITH GFR
Anion gap: 10 (ref 5–15)
BUN: 7 mg/dL (ref 6–20)
CO2: 24 mmol/L (ref 22–32)
Calcium: 9 mg/dL (ref 8.9–10.3)
Chloride: 103 mmol/L (ref 98–111)
Creatinine, Ser: 0.89 mg/dL (ref 0.44–1.00)
GFR, Estimated: >60 mL/min (ref >60)
Glucose, Bld: 107 mg/dL — ABNORMAL HIGH (ref 70–99)
Potassium: 3.5 mmol/L (ref 3.5–5.1)
Sodium: 137 mmol/L (ref 135–145)

## 2023-07-19 LAB — TROPONIN I (HIGH SENSITIVITY): Troponin I (High Sensitivity): 2 ng/L (ref <18)

## 2023-07-19 MED ORDER — LIDOCAINE 5 % EX PTCH
1.0000 | MEDICATED_PATCH | Freq: Once | CUTANEOUS | Status: DC
  Administered 2023-07-19: 1 via TRANSDERMAL
  Filled 2023-07-19: qty 1

## 2023-07-19 MED ORDER — KETOROLAC TROMETHAMINE 30 MG/ML IJ SOLN
30.0000 mg | Freq: Once | INTRAMUSCULAR | Status: AC
  Administered 2023-07-19: 30 mg via INTRAMUSCULAR
  Filled 2023-07-19: qty 1

## 2023-07-19 MED ORDER — ACETAMINOPHEN 500 MG PO TABS
1000.0000 mg | ORAL_TABLET | Freq: Once | ORAL | Status: AC
  Administered 2023-07-19: 1000 mg via ORAL
  Filled 2023-07-19: qty 2

## 2023-07-19 MED ORDER — LIDOCAINE 5 % EX PTCH: 1.0000 | MEDICATED_PATCH | CUTANEOUS | 0 refills | Status: AC

## 2023-07-19 MED ORDER — IBUPROFEN 400 MG PO TABS: 400.0000 mg | ORAL_TABLET | Freq: Four times a day (QID) | ORAL | 0 refills | Status: DC | PRN

## 2023-07-19 MED ORDER — ACETAMINOPHEN 500 MG PO TABS: 1000.0000 mg | ORAL_TABLET | Freq: Four times a day (QID) | ORAL | 2 refills | Status: DC | PRN

## 2023-07-19 NOTE — Telephone Encounter (Signed)
   Pt c/o of Chest Pain: STAT if active CP, including tightness, pressure, jaw pain, radiating pain to shoulder/upper arm/back, CP unrelieved by Nitro. Symptoms reported of SOB, nausea, vomiting, sweating.  1. Are you having CP right now? Pain her neck and left arm    2. Are you experiencing any other symptoms (ex. SOB, nausea, vomiting, sweating)? No, Tingling in left arm   3. Is your CP continuous or coming and going? Persistent the past 2 days   4. Have you taken Nitroglycerin ? no   5. How long have you been experiencing CP?  2 days    6. If NO CP at time of call then end call with telling Pt to call back or call 911 if Chest pain returns prior to return call from triage team.

## 2023-07-19 NOTE — Discharge Instructions (Addendum)
 Your evaluation in the emergency department was overall reassuring.  I do suspect you likely have a pinched nerve and some surrounding inflammation that should improve over the next several days.  I prescribed you several pain medications to help with this.  Please do follow-up with your primary care provider for reevaluation, and return to the emergency department with any new or worsening symptoms.

## 2023-07-19 NOTE — ED Triage Notes (Signed)
 Patient states neck pain that radiates down left arm; left arm heaviness and blurred vision; started 2-3 days ago.

## 2023-07-19 NOTE — Telephone Encounter (Signed)
 Patient called in reporting 2 days of numbness & tingling to her left arm which radiates to her left neck and jaw. She denies any SOB and no elephant on her chest sensation. Given her active symptoms recommended that she go to ED for further evaluation. She verbalized understanding.

## 2023-07-19 NOTE — ED Provider Notes (Signed)
 [facility] Provider Note    None    (approximate)   History   Numbness  Patient states neck pain that radiates down left arm; left arm heaviness and blurred vision; started 2-3 days ago.    HPI Holly Nicholson is a 38 y.o. female PMH bipolar disorder, GAD, MDD, hypertension, BMI greater than 45, prolonged QT, bilateral tubal ligation presents for evaluation of left neck/arm pain - Present for the past couple days, describes a burning/electric sensation that shoots down the left arm.  Also has tenderness over left upper shoulder region - No preceding trauma - Does lift heavy items regularly at work - No weakness - No midline back/neck pain - Has not taken any medications yet for this today. - Contrary to triage note, denies vision on my eval - No chest pain or shortness of breath       Physical Exam   Triage Vital Signs: ED Triage Vitals  Encounter Vitals Group     BP 07/19/23 1111 (!) 110/93     Girls Systolic BP Percentile --      Girls Diastolic BP Percentile --      Boys Systolic BP Percentile --      Boys Diastolic BP Percentile --      Pulse Rate 07/19/23 1111 (!) 52     Resp 07/19/23 1111 20     Temp 07/19/23 1111 98.1 F (36.7 C)     Temp Source 07/19/23 1111 Oral     SpO2 07/19/23 1111 100 %     Weight 07/19/23 1112 240 lb (108.9 kg)     Height 07/19/23 1112 5' (1.524 m)     Head Circumference --      Peak Flow --      Pain Score 07/19/23 1110 6     Pain Loc --      Pain Education --      Exclude from Growth Chart --     Most recent vital signs: Vitals:   07/19/23 1111  BP: (!) 110/93  Pulse: (!) 52  Resp: 20  Temp: 98.1 F (36.7 C)  SpO2: 100%     General: Awake, no distress.  CV:  Good peripheral perfusion. RRR, RP 2+ Neck:  Full rom, no midline ttp Resp:  Normal effort. CTAB Abd:  No distention. Neuro:  Aox4, CN II-XII intact, FNF wnl, finger taps fast b/l, 5/5 strength in bilateral finger extension/grip,  arm flexion/extension, EHL/FHL. BUE AG 10+ sec no drift, BLE AG 5+ sec no drift. Ambulates with steady gait. SILT. Negative Rhomberg. Other:  + Tender to palpation over left trapezius muscle and left cervical paraspinal region.  Some limited ranging of left shoulder due to pain.  Radian/median/ulnar motor intact left upper extremity, RP 2+, SILT.   ED Results / Procedures / Treatments   Labs (all labs ordered are listed, but only abnormal results are displayed) Labs Reviewed  BASIC METABOLIC PANEL WITH GFR - Abnormal; Notable for the following components:      Result Value   Glucose, Bld 107 (*)    All other components within normal limits  CBC  TROPONIN I (HIGH SENSITIVITY)  TROPONIN I (HIGH SENSITIVITY)     EKG  See ED course below   RADIOLOGY See ED course below.  No chest or left shoulder pathology identified.    PROCEDURES:  Critical Care performed: No  Procedures   MEDICATIONS ORDERED IN ED: Medications  ketorolac  (TORADOL ) 30 MG/ML injection 30 mg (  has no administration in time range)  acetaminophen  (TYLENOL ) tablet 1,000 mg (has no administration in time range)  lidocaine  (LIDODERM ) 5 % 1 patch (has no administration in time range)     IMPRESSION / MDM / ASSESSMENT AND PLAN / ED COURSE  I reviewed the triage vital signs and the nursing notes.                              DDX/MDM/AP: Differential diagnosis includes, but is not limited to, cervical radiculopathy, muscle strain, do not clinically suspect CVA based on history and exam.  Highly doubt cardiac etiology given very MSK/radicular exam and history.  Do not suspect fracture or dislocation.  No clinical concern for acute spinal cord pathology at this time.  Plan: - Labs - EKG - Chest x-ray ordered at triage - Tylenol , Toradol , Lidoderm  patch  Patient's presentation is most consistent with acute complicated illness / injury requiring diagnostic workup.   ED course below. Workup unremarkable,,  symptoms present for several days, no indication for repeat troponin, do not suspect cardiac etiology at this time.  Treated for cervical radiculopathy with Tylenol , Toradol , lidocaine  Derm patches--will discharge with Tylenol , Motrin , Lidoderm  patches.  Plan for PMD follow-up.  ED return precautions in place.  Patient agrees with plan.  Clinical Course as of 07/19/23 1245  Thu Jul 19, 2023  1222 Trop wnl Cbc, bmp wnl  [MM]  1222 Chest x-ray reviewed, unremarkable my interpretation  IMPRESSION: No acute cardiopulmonary abnormality.   [MM]  1223 Ecg = sinus bradycardia, rate 52, no gross ST elevation or depression, no significant repolarization abnormality, normal axis, normal intervals.  No evidence of ischemia no arrhythmia on my interpretation.  QTc 383. [MM]    Clinical Course User Index [MM] Clarine Ozell LABOR, MD     FINAL CLINICAL IMPRESSION(S) / ED DIAGNOSES   Final diagnoses:  Cervical radiculopathy  Muscle strain     Rx / DC Orders   ED Discharge Orders          Ordered    acetaminophen  (TYLENOL ) 500 MG tablet  Every 6 hours PRN        07/19/23 1244    ibuprofen  (IBU) 400 MG tablet  Every 6 hours PRN        07/19/23 1244    lidocaine  (LIDODERM ) 5 %  Every 24 hours        07/19/23 1244             Note:  This document was prepared using Dragon voice recognition software and may include unintentional dictation errors.   Clarine Ozell LABOR, MD 07/19/23 1245

## 2023-07-21 DIAGNOSIS — Z419 Encounter for procedure for purposes other than remedying health state, unspecified: Secondary | ICD-10-CM | POA: Diagnosis not present

## 2023-07-26 ENCOUNTER — Other Ambulatory Visit: Payer: Self-pay

## 2023-07-28 NOTE — Patient Instructions (Signed)
 Thank you for allowing the Complex Care Management team to participate in your care. It was great speaking with you!  Visit Information  Holly Nicholson was given information about Medicaid Managed Care team care coordination services as a part of their [facility] Medicaid benefit. Holly Nicholson verbally consented to engagement with the [facility] Managed Care team.   If you are experiencing a medical emergency, please call 911 or report to your local emergency department or urgent care.   If you have a non-emergency medical problem during routine business hours, please contact your provider's office and ask to speak with a nurse.   For questions related to your [facility] health plan, please call: [phone] or go here:[email or web address]  If you would like to schedule transportation through your [facility] plan, please call the following number at least 2 days in advance of your appointment: [phone].   You can also use the MTM portal or MTM mobile app to manage your rides. Reimbursement for transportation is available through [facility]! For the portal, please go to [email or web address]  Call the [facility] Crisis Line at [phone], at any time, 24 hours a day, 7 days a week. If you are in danger or need immediate medical attention call 911.  If you would like help to quit smoking, call 1-800-QUIT-NOW ((867) 102-9208) OR Espaol: 1-855-Djelo-Ya (8-144-664-6430) o para ms informacin haga clic aqu or Text READY to 799-599 to register via text  Holly Nicholson - following are the goals we discussed in your visit today:   Goals Addressed             This Visit's Progress    VBCI RNCM:  Monitor, Self-Manage And Reduce Symptoms of Hypertension       Problems:  Chronic Disease Management support and education needs related to Hypertension   Goal: Over the next 3 months the patient will: Attend all scheduled medical appointments Take all medications exactly as  prescribed  Demonstrate ongoing adherence to prescribed treatment plan for Hypertension  Hypertension:Interventions (Status: Goal on track  ) Long Term Goal  Reviewed current treatment plan, self-management, and adherence to plan as established by provider.  Provided education regarding importance of taking medications as prescribed. Discussed indications for use.  Provided education regarding established blood pressure parameters along with indications for notifying a provider.   Provided education regarding signs and symptoms of hypotension and hypertension  Reviewed symptoms. Denies chest pain or palpitations. Denies headaches, lightheadedness, dizziness or visual changes. Reports experiencing lower extremity edema within the last month. Reports currently wearing compression hose and keeping extremities elevated when sitting for prolonged periods.  Discussed activity tolerance. She was evaluated in the Emergency Room on July 19, 2023 for back pain. Reports doing well since being evaluated. Denies concerns today. Advised to schedule follow up with her PCP once her personal schedule is confirmed. Provided information regarding recommended cardiac prudent diet. Reports monitoring nutritional intake as advised. Encouraged to continue reading nutrition labels, continue monitoring sodium intake, and avoid highly processed foods when possible.  Discussed plan to complete routine Eye exam. Declined offer to assist with scheduling. Reports she will contact the Optometrist to arrange an appointment. Reviewed SDOH barriers. Reports experiencing several barriers within the last year, however reports currently doing well. Agreed to update the care team if this changes and assistance with identifying resources is required. Provided information for [email or web address].  Reviewed signs and symptoms of heart attack, stroke and worsening symptoms that require immediate medical attention.    BP Readings from  Last 3  Encounters:  07/19/23 (!) 110/93  05/22/23 123/80  05/03/23 120/84     Most recent eGFR/CrCl:  Lab Results  Component Value Date   EGFR 91 03/30/2023    No components found for: CRCL    Patient Self-Care Activities:  Take all medications as prescribed Attend all scheduled provider appointments Call pharmacy for medication refills 3-7 days in advance of running out of medications Check blood pressure and keep a log Call doctor for blood pressures outside of established range Read nutrition labels. Eat whole grains, fruits and vegetables, lean meats and healthy fats Limit salt intake  Follow recommended safety and fall prevention measures Call provider office for new concerns or questions    Plan:  Telephone follow up appointment with on August 21, 2023              Patient verbalizes understanding of instructions and care plan provided today and agrees to view in MyChart. Active MyChart status and patient understanding of how to access instructions and care plan via MyChart confirmed with patient.     RN Care Manager will follow up on August 21, 2023 at 1130.   [facility] Health RN Care Manager Direct Dial: [phone]  Fax: [phone] Website: [email or web address]

## 2023-07-28 NOTE — Patient Outreach (Signed)
 Complex Care Management   Visit Note   Name:  KEUNDRA PETRUCELLI MRN: [id number] DOB: Mar 20, 1985  Situation: Referral received for Complex Care Management related to Hypertension. I obtained verbal consent from Patient.  Visit completed with Mrs. Harm via telephone.  Background:   Past Medical History:  Diagnosis Date   Anxiety    BMI 45.0-49.9, adult (HCC)    Depression    Elevated blood pressure    GERD (gastroesophageal reflux disease)    History of bilateral tubal ligation 2007   History of bilateral tubal ligation 2007   Hypertension    Long Q-T syndrome    Low-lying placenta    Lower extremity edema    Tubal pregnancy 08/2014    Assessment: Patient Reported Symptoms: Cognitive Cognitive Status: Alert and oriented to person, place, and time, Normal speech and language skills Cognitive/Intellectual Conditions Management [RPT]: None reported or documented in medical history or problem list Health Maintenance Behaviors: Annual physical exam, Exercise Healing Pattern: Average Health Facilitated by: Rest, Stress management  Neurological Neurological Review of Symptoms: No symptoms reported  HEENT HEENT Symptoms Reported: No symptoms reported  Cardiovascular Cardiovascular Symptoms Reported: No symptoms reported Does patient have uncontrolled Hypertension?: No  Respiratory Respiratory Symptoms Reported: No symptoms reported  Endocrine Endocrine Symptoms Reported: No symptoms reported Is patient diabetic?: No  Gastrointestinal Gastrointestinal Symptoms Reported: No symptoms reported  Genitourinary Genitourinary Symptoms Reported: No symptoms reported  Integumentary Integumentary Symptoms Reported: No symptoms reported  Musculoskeletal Musculoskelatal Symptoms Reviewed: No symptoms reported Additional Musculoskeletal Details: Reports being evaluated in the Emergency Room on July 19, 2023 for back pain. Reports doing well since evaluation. Denies concerns today.  Psychosocial  Psychosocial Symptoms Reported: No symptoms reported Quality of Family Relationships: supportive Do you feel physically threatened by others?: No      07/26/2023   11:47 AM  Depression screen PHQ 2/9  Decreased Interest 0  Down, Depressed, Hopeless 0  PHQ - 2 Score 0    There were no vitals filed for this visit.  Medications Reviewed Today     Reviewed by Karoline Lima, RN (Registered Nurse) on 07/28/23 at 1100  Med List Status: <None>   Medication Order Taking? Sig Documenting Provider Last Dose Status Informant  acetaminophen  (TYLENOL ) 500 MG tablet [id number]  Take 2 tablets (1,000 mg total) by mouth every 6 (six) hours as needed. Clarine Ozell LABOR, MD  Active   aspirin  EC 81 MG EC tablet [id number]  Take 1 tablet (81 mg total) by mouth daily. Swallow whole. Tobie Calix, MD  Active Pharmacy Records, Multiple Informants, Other  atorvastatin  (LIPITOR) 10 MG tablet [id number]  Take 1 tablet (10 mg total) by mouth daily. Louder, Megan P, DO  Active   ibuprofen  (IBU) 400 MG tablet [id number]  Take 1 tablet (400 mg total) by mouth every 6 (six) hours as needed. Clarine Ozell LABOR, MD  Active   lidocaine  (LIDODERM ) 5 % [id number]  Place 1 patch onto the skin daily for 10 days. Remove & Discard patch within 12 hours or as directed by MD Clarine Ozell LABOR, MD  Active   lurasidone  (LATUDA ) 20 MG TABS tablet [id number]  Take 1 tablet (20 mg total) by mouth daily.  Patient not taking: Reported on 07/12/2023   Louder Duwaine SQUIBB, DO  Active   nadolol  (CORGARD ) 40 MG tablet [id number]  Take 1 tablet (40 mg total) by mouth daily. Louder, Megan P, DO  Active   pantoprazole  (PROTONIX ) 40 MG tablet [id number]  Take 1 tablet (  40 mg total) by mouth daily. Barg, Megan P, DO  Active   predniSONE  (DELTASONE ) 10 MG tablet [id number]  6 tabs in the AM day 1, 5 tabs day 2, decrease by 1 daily until gone  Patient not taking: Reported on 07/12/2023   Vicci Duwaine SQUIBB, DO  Active             Recommendation:    Continue Current Plan of Care  Follow Up Plan:   Telephone follow up appointment with Nurse Case Manager on August 21, 2023    [facility] RN Care Manager Direct Dial: [phone]  Fax: [phone] Website: [email or web address]

## 2023-08-21 ENCOUNTER — Telehealth: Payer: Self-pay

## 2023-08-21 DIAGNOSIS — Z419 Encounter for procedure for purposes other than remedying health state, unspecified: Secondary | ICD-10-CM | POA: Diagnosis not present

## 2023-09-11 ENCOUNTER — Telehealth: Payer: Self-pay

## 2023-09-11 NOTE — Progress Notes (Signed)
   09/11/2023  Patient ID: Holly Nicholson, female   DOB: 09/13/1985, 38 y.o.   MRN: [id number]  Returning missed call/voicemail from patient stating she has moved and would like to update her address as well as transfer her medications to [facility] for home delivery.  Address has been updated in the system and preferred pharmacy changed to [facility].  Patient does not need refills at this time, but I am messaging [facility] to request that they transfer atorvastatin  10mg , nadolol  40mg , and pantoprazole  from [facility] [place] [phone]). She gets ASA 81mg  OTC and is not taking Latuda .  Patient does not need medications refilled at this time, so these will be placed on hold for her to request when needed.  Sending a message with phone number to [facility], so she can call to request refills or request through MyChart.  I also advised patient she is due for a follow-up with Dr. Louder since 6/26 visit was missed.    Holly Nicholson, PharmD, DPLA

## 2023-09-18 ENCOUNTER — Other Ambulatory Visit: Payer: Self-pay

## 2023-09-18 NOTE — Patient Instructions (Signed)
 Thank you for allowing the Complex Care Management team to participate in your care. It was great speaking with you!  Reminders: -Please be sure to schedule the follow up appointment with your Primary Care Provider once your work schedule is confirmed.  We will follow up on October 19, 2023 at 3:30. Please do not hesitate to contact me if you require assistance prior to our next outreach.   Visit Information Holly Nicholson was given information about Medicaid Managed Care team care coordination services as a part of their [facility] Medicaid benefit.   If you would like to schedule transportation through your [facility] plan, please call the following number at least 2 days in advance of your appointment: [phone].   You can also use the MTM portal or MTM mobile app to manage your rides. Reimbursement for transportation is available through [facility]! For the portal, please go to [email or web address]  Call the [facility] Crisis Line at [phone], at any time, 24 hours a day, 7 days a week. If you are in danger or need immediate medical attention call 911.   Holly Nicholson - following are the goals we discussed in your visit today:   Goals Addressed             This Visit's Progress    VBCI RNCM:  Monitor, Self-Manage And Reduce Symptoms of Hypertension       Problems:  Chronic Disease Management support and education needs related to Hypertension   Goal: Over the next 3 months the patient will: Attend all scheduled medical appointments Take all medications exactly as prescribed  Demonstrate ongoing adherence to prescribed treatment plan for Hypertension  Hypertension:Interventions (Status: Goal on track  ) Long Term Goal  Reviewed current treatment plan, self-management, and adherence to plan as established by provider.  Provided education regarding importance of taking medications as prescribed. Discussed indications for use.  Provided education regarding established  blood pressure parameters along with indications for notifying a provider. Reports readings have been within range.  Provided education regarding signs and symptoms of hypotension and hypertension  Reviewed symptoms. Denies chest pain or palpitations. Denies headaches, lightheadedness, dizziness or visual changes.  Provided information regarding recommended cardiac prudent diet. Reports monitoring nutritional intake as advised. Encouraged to continue reading nutrition labels, continue monitoring sodium intake, and avoid highly processed foods when possible.  Discussed plan to complete routine Eye exam. Declined offer to assist with scheduling. Reports she will contact the Optometrist to arrange an appointment. Reviewed SDOH barriers. Reports experiencing several barriers within the last year, however reports currently doing well. Reports relocating. Agreed to update the care team if this changes and assistance with identifying resources is required.  Reviewed signs and symptoms of heart attack, stroke and worsening symptoms that require immediate medical attention.  BP Readings from Last 3 Encounters:  07/19/23 (!) 110/93  05/22/23 123/80  05/03/23 120/84      Most recent eGFR/CrCl:  Lab Results  Component Value Date   EGFR 91 03/30/2023    No components found for: CRCL    Patient Self-Care Activities:  Take all medications as prescribed Attend all scheduled provider appointments Call pharmacy for medication refills 3-7 days in advance of running out of medications Check blood pressure and keep a log Call doctor for blood pressures outside of established range Read nutrition labels. Eat whole grains, fruits and vegetables, lean meats and healthy fats Limit salt intake  Follow recommended safety and fall prevention measures Call provider office for new concerns or  questions    Plan:  The patient has been provided with contact information for the care management team and has been  advised to call with any health related questions or concerns.              Please see education materials related to Hypertension provided by MyChart link.  Patient verbalizes understanding of instructions and care plan provided today and agrees to view in MyChart. Active MyChart status and patient understanding of how to access instructions and care plan via MyChart confirmed with patient.     RN Care Manager will follow up on October 19, 2023 at 3:30 pm.   [facility] Acron [facility] RN Care Manager Direct Dial: [phone]  Fax: [phone] Website: [email or web address]

## 2023-09-18 NOTE — Patient Outreach (Unsigned)
 Complex Care Management   Visit Note  09/18/2023  Name:  Holly Nicholson MRN: [id number] DOB: [birth year]  Situation: Referral received for Complex Care Management related to Hypertension. I obtained verbal consent from Patient.  Visit completed with Holly Nicholson via telephone today.  Background:   Past Medical History:  Diagnosis Date   Anxiety    BMI 45.0-49.9, adult (HCC)    Depression    Elevated blood pressure    GERD (gastroesophageal reflux disease)    History of bilateral tubal ligation Oct 01, 2005   History of bilateral tubal ligation 10-01-2005   Hypertension    Long Q-T syndrome    Low-lying placenta    Lower extremity edema    Tubal pregnancy 08/2014    Assessment: Patient Reported Symptoms: Cognitive Cognitive Status: Alert and oriented to person, place, and time, Normal speech and language skills Cognitive/Intellectual Conditions Management [RPT]: None reported or documented in medical history or problem list Health Maintenance Behaviors: Annual physical exam, Exercise Healing Pattern: Average Health Facilitated by: Rest, Stress management  Neurological Neurological Review of Symptoms: No symptoms reported Neurological Management Strategies: Routine screening Neurological Self-Management Outcome: 4 (good)  HEENT HEENT Symptoms Reported: No symptoms reported HEENT Management Strategies: Routine screening HEENT Self-Management Outcome: 4 (good)  Cardiovascular Cardiovascular Symptoms Reported: No symptoms reported Does patient have uncontrolled Hypertension?: No Cardiovascular Self-Management Outcome: 4 (good)  Respiratory Respiratory Symptoms Reported: No symptoms reported Respiratory Management Strategies: Routine screening Respiratory Self-Management Outcome: 4 (good)  Endocrine Endocrine Symptoms Reported: No symptoms reported Is patient diabetic?: No Endocrine Self-Management Outcome: 4 (good)  Gastrointestinal Gastrointestinal Symptoms Reported: No symptoms  reported Gastrointestinal Self-Management Outcome: 4 (good)  Genitourinary Genitourinary Symptoms Reported: No symptoms reported Genitourinary Self-Management Outcome: 4 (good)  Integumentary Integumentary Symptoms Reported: No symptoms reported Skin Management Strategies: Routine screening Skin Self-Management Outcome: 4 (good)  Musculoskeletal Musculoskelatal Symptoms Reviewed: No symptoms reported Musculoskeletal Management Strategies: Routine screening Musculoskeletal Self-Management Outcome: 4 (good)  Psychosocial Psychosocial Symptoms Reported: Sadness - if selected complete PHQ 2-9 Behavioral Management Strategies: Adequate rest, Counseling, Support system Behavioral Health Self-Management Outcome: 4 (good) Behavioral Health Comment: Reports episodes of sadness due to loss of her mom. Reports 2023/10/02 is the anniversary of her mother's death. Major Change/Loss/Stressor/Fears (CP): Death of a loved one (Reports October 02, 2023 is the anniversary month of her mom's death.) Behaviors When Feeling Stressed/Fearful: Rest. Spending time with family. Techniques to Cope with Loss/Stress/Change:  (Rest and spending time with family.) Quality of Family Relationships: supportive Do you feel physically threatened by others?: No   Flowsheet Row Patient Outreach Telephone from 09/18/2023 in [place] POPULATION HEALTH DEPARTMENT  PHQ-2 Total Score 1      There were no vitals filed for this visit.  Medications Reviewed Today     Reviewed by Karoline Lima, RN (Registered Nurse) on 09/18/23 at 1510  Med List Status: <None>   Medication Order Taking? Sig Documenting Provider Last Dose Status Informant  aspirin  EC 81 MG EC tablet [id number]  Take 1 tablet (81 mg total) by mouth daily. Swallow whole. Tobie Calix, MD  Active Pharmacy Records, Multiple Informants, Other           Med Note ZENA, CHERYL A   Tue Sep 11, 2023 12:28 PM) Gets OTC  atorvastatin  (LIPITOR) 10 MG tablet [id number]  Take 1  tablet (10 mg total) by mouth daily. [place], Megan P, DO  Active   nadolol  (CORGARD ) 40 MG tablet [id number]  Take 1 tablet (40 mg total) by mouth daily. Louder Bouchard  P, DO  Active   pantoprazole  (PROTONIX ) 40 MG tablet [id number]  Take 1 tablet (40 mg total) by mouth daily. Vicci Duwaine SQUIBB, DO  Active             Recommendation:   Continue Current Plan of Care  Follow Up Plan:   Telephone follow up appointment with Nurse Case Manager on October 19, 2023   Jackson Acron [facility] RN Care Manager Direct Dial: [phone]  Fax: [phone] Website: [email or web address]

## 2023-09-21 ENCOUNTER — Other Ambulatory Visit: Payer: Self-pay

## 2023-09-21 ENCOUNTER — Other Ambulatory Visit (HOSPITAL_COMMUNITY): Payer: Self-pay

## 2023-09-21 DIAGNOSIS — Z419 Encounter for procedure for purposes other than remedying health state, unspecified: Secondary | ICD-10-CM | POA: Diagnosis not present

## 2023-09-26 ENCOUNTER — Other Ambulatory Visit (HOSPITAL_COMMUNITY): Payer: Self-pay

## 2023-09-26 ENCOUNTER — Other Ambulatory Visit: Payer: Self-pay

## 2023-09-27 ENCOUNTER — Other Ambulatory Visit (HOSPITAL_COMMUNITY): Payer: Self-pay

## 2023-09-27 ENCOUNTER — Encounter: Payer: Self-pay | Admitting: Pharmacist

## 2023-09-27 ENCOUNTER — Other Ambulatory Visit: Payer: Self-pay

## 2023-09-27 ENCOUNTER — Telehealth: Payer: Self-pay

## 2023-09-27 MED ORDER — ASPIRIN 81 MG PO TBEC
81.0000 mg | DELAYED_RELEASE_TABLET | Freq: Every day | ORAL | 0 refills | Status: AC
  Filled 2023-09-27 – 2024-02-15 (×2): qty 30, 30d supply, fill #0

## 2023-09-27 MED ORDER — ATORVASTATIN CALCIUM 10 MG PO TABS
10.0000 mg | ORAL_TABLET | Freq: Every day | ORAL | 0 refills | Status: DC
  Filled 2023-09-27: qty 30, 30d supply, fill #0

## 2023-09-27 MED ORDER — NADOLOL 40 MG PO TABS
40.0000 mg | ORAL_TABLET | Freq: Every day | ORAL | 0 refills | Status: DC
  Filled 2023-09-27: qty 30, 30d supply, fill #0

## 2023-09-27 MED ORDER — PANTOPRAZOLE SODIUM 40 MG PO TBEC
40.0000 mg | DELAYED_RELEASE_TABLET | Freq: Every day | ORAL | 0 refills | Status: DC
  Filled 2023-09-27: qty 30, 30d supply, fill #0

## 2023-09-27 NOTE — Telephone Encounter (Signed)
 Called patient and left a message for her to call back to get scheduled for an OV.

## 2023-09-27 NOTE — Telephone Encounter (Signed)
Needs an appointment. Will get her enough medicine to make it to appointment when it's booked.   

## 2023-09-27 NOTE — Progress Notes (Signed)
   09/27/2023  Patient ID: Holly Nicholson, female   DOB: May 06, 1985, 38 y.o.   MRN: [id number]  In basket message from [facility] stating new prescriptions were needed in order to set patient up for home delivery of medications, because no refills remain on orders at [facility].  It appears patient is due for a follow-up with Dr. Louder (missed 6/26 follow-up scheduled with Dr. Herold).  Orders pending for 1 month supply of medications with note stating patient will need to be seen for additional refills.  Channing DELENA Mealing, PharmD, DPLA

## 2023-09-28 NOTE — Telephone Encounter (Signed)
 Called patient lvm for patient to call and schedule ov

## 2023-10-01 NOTE — Telephone Encounter (Signed)
 3rd attempt to reach patient to get her scheduled. Left vm to call back

## 2023-10-02 ENCOUNTER — Other Ambulatory Visit: Payer: Self-pay | Admitting: Cardiology

## 2023-10-02 ENCOUNTER — Other Ambulatory Visit: Payer: Self-pay

## 2023-10-08 ENCOUNTER — Other Ambulatory Visit: Payer: Self-pay | Admitting: Family Medicine

## 2023-10-08 MED ORDER — PANTOPRAZOLE SODIUM 40 MG PO TBEC: 40.0000 mg | DELAYED_RELEASE_TABLET | Freq: Every day | ORAL | 1 refills | Status: DC

## 2023-10-08 MED ORDER — NADOLOL 40 MG PO TABS: 40.0000 mg | ORAL_TABLET | Freq: Every day | ORAL | 1 refills | Status: DC

## 2023-10-08 MED ORDER — ATORVASTATIN CALCIUM 10 MG PO TABS: 10.0000 mg | ORAL_TABLET | Freq: Every day | ORAL | 1 refills | Status: DC

## 2023-10-08 NOTE — Addendum Note (Signed)
 Addended by: VICCI DUWAINE SQUIBB on: 10/08/2023 08:41 PM   Modules accepted: Orders

## 2023-10-10 ENCOUNTER — Telehealth: Payer: Self-pay

## 2023-10-10 ENCOUNTER — Other Ambulatory Visit (HOSPITAL_COMMUNITY): Payer: Self-pay

## 2023-10-10 NOTE — Telephone Encounter (Signed)
 Pharmacy Patient Advocate Encounter   Received notification from Onbase that prior authorization for Nadolol  40 mg tabs is required/requested.   Insurance verification completed.   The patient is insured through [facility] MEDICAID.   Per test claim: The current 30 day co-pay is, $4.00.  No PA needed at this time. This test claim was processed through [facility]- copay amounts may vary at other pharmacies due to pharmacy/plan contracts, or as the patient moves through the different stages of their insurance plan.

## 2023-10-10 NOTE — Telephone Encounter (Signed)
 Too soon for refill.  Requested Prescriptions  Pending Prescriptions Disp Refills   pantoprazole  (PROTONIX ) 40 MG tablet [Pharmacy Med Name: PANTOPRAZOLE  SOD DR 40 MG TAB] 90 tablet 1    Sig: TAKE 1 TABLET BY MOUTH EVERY DAY     Gastroenterology: Proton Pump Inhibitors Passed - 10/10/2023  1:02 PM      Passed - Valid encounter within last 12 months    Recent Outpatient Visits           4 months ago Bipolar affective disorder, currently depressed, moderate (HCC)   [place] [facility] [place], Megan P, DO   6 months ago Routine general medical examination at a health care facility   [facility], [place] P, DO   7 months ago Influenza A   [place] [facility] [place], [place], DO

## 2023-10-11 ENCOUNTER — Other Ambulatory Visit (HOSPITAL_COMMUNITY): Payer: Self-pay

## 2023-10-16 ENCOUNTER — Ambulatory Visit: Attending: Family Medicine

## 2023-10-16 ENCOUNTER — Ambulatory Visit (INDEPENDENT_AMBULATORY_CARE_PROVIDER_SITE_OTHER): Admitting: Family Medicine

## 2023-10-16 ENCOUNTER — Other Ambulatory Visit: Payer: Self-pay

## 2023-10-16 ENCOUNTER — Other Ambulatory Visit (HOSPITAL_COMMUNITY): Payer: Self-pay

## 2023-10-16 ENCOUNTER — Encounter: Payer: Self-pay | Admitting: Family Medicine

## 2023-10-16 VITALS — BP 125/79 | HR 58 | Temp 97.7°F | Resp 15 | Ht 60.0 in | Wt 242.2 lb

## 2023-10-16 DIAGNOSIS — R600 Localized edema: Secondary | ICD-10-CM

## 2023-10-16 DIAGNOSIS — M25511 Pain in right shoulder: Secondary | ICD-10-CM

## 2023-10-16 DIAGNOSIS — I10 Essential (primary) hypertension: Secondary | ICD-10-CM | POA: Diagnosis not present

## 2023-10-16 DIAGNOSIS — R9431 Abnormal electrocardiogram [ECG] [EKG]: Secondary | ICD-10-CM | POA: Diagnosis not present

## 2023-10-16 DIAGNOSIS — R55 Syncope and collapse: Secondary | ICD-10-CM

## 2023-10-16 DIAGNOSIS — E782 Mixed hyperlipidemia: Secondary | ICD-10-CM | POA: Insufficient documentation

## 2023-10-16 DIAGNOSIS — F3132 Bipolar disorder, current episode depressed, moderate: Secondary | ICD-10-CM

## 2023-10-16 MED ORDER — ATORVASTATIN CALCIUM 10 MG PO TABS
10.0000 mg | ORAL_TABLET | Freq: Every day | ORAL | 1 refills | Status: AC
  Filled 2023-10-16 – 2023-11-23 (×3): qty 90, 90d supply, fill #0
  Filled 2024-02-15: qty 90, 90d supply, fill #1

## 2023-10-16 MED ORDER — NADOLOL 40 MG PO TABS
40.0000 mg | ORAL_TABLET | Freq: Every day | ORAL | 1 refills | Status: AC
  Filled 2023-10-16 – 2023-11-23 (×3): qty 90, 90d supply, fill #0
  Filled 2024-02-15: qty 90, 90d supply, fill #1

## 2023-10-16 MED ORDER — PANTOPRAZOLE SODIUM 40 MG PO TBEC
40.0000 mg | DELAYED_RELEASE_TABLET | Freq: Every day | ORAL | 1 refills | Status: AC
  Filled 2023-10-16 – 2023-11-23 (×3): qty 90, 90d supply, fill #0
  Filled 2024-02-15: qty 90, 90d supply, fill #1

## 2023-10-16 NOTE — Assessment & Plan Note (Signed)
 Under good control on current regimen. Continue current regimen. Continue to monitor. Call with any concerns. Refills given. Labs drawn today.

## 2023-10-16 NOTE — Assessment & Plan Note (Signed)
 Continue to follow with cardiology. Avoid QT prolonging medicines. Continue to monitor closely.

## 2023-10-16 NOTE — Assessment & Plan Note (Signed)
 Stable at this time, but still not great- given QT prolongation will refer to psychiatry. Await their input.

## 2023-10-16 NOTE — Progress Notes (Signed)
 BP 125/79 (BP Location: Left Arm, Patient Position: Sitting, Cuff Size: Large)   Pulse (!) 58   Temp 97.7 F (36.5 C) (Oral)   Resp 15   Ht 5' (1.524 m)   Wt 242 lb 3.2 oz (109.9 kg)   LMP 08/13/2017 (Exact Date)   SpO2 99%   BMI 47.30 kg/m    Subjective:    Patient ID: Holly Nicholson, female    DOB: [birth year], 38 y.o.   MRN: [id number]  HPI: Holly Nicholson is a 38 y.o. female  Chief Complaint  Patient presents with   Follow-up    Would like her meds sent to [facility] for delivery however needs one sent locally. Will message once she has picked up locally.    Shoulder Pain    Has time she has limited full ROM after a fall. Did not get get seen since. Fell in the shower. No imaging.    HYPERTENSION / HYPERLIPIDEMIA Satisfied with current treatment? yes Duration of hypertension: chronic BP monitoring frequency: not checking BP medication side effects: no Past BP meds: nadolol  Duration of hyperlipidemia: chronic Cholesterol medication side effects: no Cholesterol supplements: none Past cholesterol medications: atorvastatin  Medication compliance: excellent compliance Aspirin : yes Recent stressors: yes Recurrent headaches: no Visual changes: no Palpitations: no Dyspnea: yes Chest pain: no Lower extremity edema: yes Dizzy/lightheaded: yes  DIZZINESS Duration: 2 weeks Description of symptoms: going to pass out, flushed, legs feel week Duration of episode: minutes Dizziness frequency: recurrent 4-5x a day Provoking factors: at random Triggered by rolling over in bed: no Triggered by bending over: no Aggravated by head movement: no Aggravated by exertion, coughing, loud noises: yes Recent head injury: no Recent or current viral symptoms: no History of vasovagal episodes: no Nausea: yes Vomiting: no Tinnitus: no Hearing loss: no Aural fullness: no Headache: yes Photophobia/phonophobia: yes Unsteady gait: yes Postural instability: yes Diplopia,  dysarthria, dysphagia or weakness: yes Related to exertion: yes Pallor: no Diaphoresis: no Dyspnea: yes Chest pain: no  SHOULDER PAIN Duration: about a month Involved shoulder: right Mechanism of injury: fall in the shoulder Location: posterior Onset:sudden Severity: severe  Quality:  shooting and aching Frequency: constant Radiation: no Aggravating factors: movement  Alleviating factors: APAP, NSAIDs, and rest  Status: worse Treatments attempted: rest, APAP, and ibuprofen   Relief with NSAIDs?:  mild Weakness: no Numbness: no Decreased grip strength: yes Redness: no Swelling: no Bruising: no Fevers: no  Relevant past medical, surgical, family and social history reviewed and updated as indicated. Interim medical history since our last visit reviewed. Allergies and medications reviewed and updated.  Review of Systems  Constitutional:  Positive for fatigue. Negative for activity change, appetite change, chills, diaphoresis, fever and unexpected weight change.  Respiratory:  Positive for shortness of breath. Negative for apnea, cough, choking, chest tightness, wheezing and stridor.   Cardiovascular:  Positive for palpitations. Negative for chest pain and leg swelling.  Musculoskeletal:  Positive for arthralgias and myalgias. Negative for back pain, gait problem, joint swelling, neck pain and neck stiffness.  Skin: Negative.   Psychiatric/Behavioral:  Positive for dysphoric mood. Negative for agitation, behavioral problems, confusion, decreased concentration, hallucinations, self-injury, sleep disturbance and suicidal ideas. The patient is nervous/anxious. The patient is not hyperactive.     Per HPI unless specifically indicated above     Objective:    BP 125/79 (BP Location: Left Arm, Patient Position: Sitting, Cuff Size: Large)   Pulse (!) 58   Temp 97.7 F (36.5 C) (Oral)  Resp 15   Ht 5' (1.524 m)   Wt 242 lb 3.2 oz (109.9 kg)   LMP 08/13/2017 (Exact Date)    SpO2 99%   BMI 47.30 kg/m   Wt Readings from Last 3 Encounters:  10/16/23 242 lb 3.2 oz (109.9 kg)  07/19/23 240 lb (108.9 kg)  05/22/23 239 lb 6.4 oz (108.6 kg)    Physical Exam Vitals and nursing note reviewed.  Constitutional:      General: She is not in acute distress.    Appearance: Normal appearance. She is obese. She is not ill-appearing, toxic-appearing or diaphoretic.  HENT:     Head: Normocephalic and atraumatic.     Right Ear: External ear normal.     Left Ear: External ear normal.     Nose: Nose normal.     Mouth/Throat:     Mouth: Mucous membranes are moist.     Pharynx: Oropharynx is clear.  Eyes:     General: No scleral icterus.       Right eye: No discharge.        Left eye: No discharge.     Extraocular Movements: Extraocular movements intact.     Conjunctiva/sclera: Conjunctivae normal.     Pupils: Pupils are equal, round, and reactive to light.  Cardiovascular:     Rate and Rhythm: Normal rate and regular rhythm.     Pulses: Normal pulses.     Heart sounds: Normal heart sounds. No murmur heard.    No friction rub. No gallop.  Pulmonary:     Effort: Pulmonary effort is normal. No respiratory distress.     Breath sounds: Normal breath sounds. No stridor. No wheezing, rhonchi or rales.  Chest:     Chest wall: No tenderness.  Musculoskeletal:        General: Normal range of motion.     Cervical back: Normal range of motion and neck supple.  Skin:    General: Skin is warm and dry.     Capillary Refill: Capillary refill takes less than 2 seconds.     Coloration: Skin is not jaundiced or pale.     Findings: No bruising, erythema, lesion or rash.  Neurological:     General: No focal deficit present.     Mental Status: She is alert and oriented to person, place, and time. Mental status is at baseline.  Psychiatric:        Mood and Affect: Mood normal.        Behavior: Behavior normal.        Thought Content: Thought content normal.        Judgment:  Judgment normal.     Results for orders placed or performed during the hospital encounter of 07/19/23  Basic metabolic panel   Collection Time: 07/19/23 11:13 AM  Result Value Ref Range   Sodium 137 135 - 145 mmol/L   Potassium 3.5 3.5 - 5.1 mmol/L   Chloride 103 98 - 111 mmol/L   CO2 24 22 - 32 mmol/L   Glucose, Bld 107 (H) 70 - 99 mg/dL   BUN 7 6 - 20 mg/dL   Creatinine, Ser 9.10 0.44 - 1.00 mg/dL   Calcium  9.0 8.9 - 10.3 mg/dL   GFR, Estimated >39 >39 mL/min   Anion gap 10 5 - 15  CBC   Collection Time: 07/19/23 11:13 AM  Result Value Ref Range   WBC 6.3 4.0 - 10.5 K/uL   RBC 4.26 3.87 - 5.11 MIL/uL   Hemoglobin  13.2 12.0 - 15.0 g/dL   HCT 62.0 63.9 - 53.9 %   MCV 89.0 80.0 - 100.0 fL   MCH 31.0 26.0 - 34.0 pg   MCHC 34.8 30.0 - 36.0 g/dL   RDW 86.6 88.4 - 84.4 %   Platelets 175 150 - 400 K/uL   nRBC 0.0 0.0 - 0.2 %  Troponin I (High Sensitivity)   Collection Time: 07/19/23 11:13 AM  Result Value Ref Range   Troponin I (High Sensitivity) 2 <18 ng/L      Assessment & Plan:   Problem List Items Addressed This Visit       Cardiovascular and Mediastinum   Essential hypertension   Under good control on current regimen. Continue current regimen. Continue to monitor. Call with any concerns. Refills given. Labs drawn today.        Relevant Medications   atorvastatin  (LIPITOR) 10 MG tablet   nadolol  (CORGARD ) 40 MG tablet   Other Relevant Orders   Comprehensive metabolic panel with GFR   CBC with Differential/Platelet     Other   Bipolar affective disorder, currently depressed, moderate (HCC)   Stable at this time, but still not great- given QT prolongation will refer to psychiatry. Await their input.       Relevant Orders   Ambulatory referral to Psychiatry   QT prolongation   Continue to follow with cardiology. Avoid QT prolonging medicines. Continue to monitor closely.       Relevant Orders   Ambulatory referral to Psychiatry   Mixed hyperlipidemia    Under good control on current regimen. Continue current regimen. Continue to monitor. Call with any concerns. Refills given. Labs drawn today.       Relevant Medications   atorvastatin  (LIPITOR) 10 MG tablet   nadolol  (CORGARD ) 40 MG tablet   Other Relevant Orders   Comprehensive metabolic panel with GFR   CBC with Differential/Platelet   Lipid Panel w/o Chol/HDL Ratio   Other Visit Diagnoses       Near syncope    -  Primary   EKG normal. Will check labs and zio monitor. Warning signs to go to the ER discussed today. Call with any concerns. Continue to monitor.   Relevant Medications   atorvastatin  (LIPITOR) 10 MG tablet   nadolol  (CORGARD ) 40 MG tablet   Other Relevant Orders   EKG 12-Lead   LONG TERM MONITOR XT (3-14 DAYS)     Acute pain of right shoulder       Will obtain x-rays of neck and shoulder. Await results. Treat as needed.   Relevant Orders   DG Shoulder Right   DG Cervical Spine Complete     Peripheral edema       Will check labs today. Await results. Encouraged compression hose. Call with any concerns.   Relevant Orders   B Nat Peptide   TSH        Follow up plan: Return in about 4 weeks (around 11/13/2023).

## 2023-10-17 ENCOUNTER — Encounter: Payer: Self-pay | Admitting: Pharmacist

## 2023-10-17 ENCOUNTER — Other Ambulatory Visit: Payer: Self-pay

## 2023-10-17 ENCOUNTER — Ambulatory Visit: Payer: Self-pay | Admitting: Family Medicine

## 2023-10-17 DIAGNOSIS — M4722 Other spondylosis with radiculopathy, cervical region: Secondary | ICD-10-CM

## 2023-10-17 LAB — BRAIN NATRIURETIC PEPTIDE: BNP: 57.3 pg/mL (ref 0.0–100.0)

## 2023-10-17 LAB — COMPREHENSIVE METABOLIC PANEL WITH GFR
ALT: 17 IU/L (ref 0–32)
AST: 14 IU/L (ref 0–40)
Albumin: 4.2 g/dL (ref 3.9–4.9)
Alkaline Phosphatase: 104 IU/L (ref 41–116)
BUN/Creatinine Ratio: 9 (ref 9–23)
BUN: 7 mg/dL (ref 6–20)
Bilirubin Total: 1.1 mg/dL (ref 0.0–1.2)
CO2: 25 mmol/L (ref 20–29)
Calcium: 8.9 mg/dL (ref 8.7–10.2)
Chloride: 102 mmol/L (ref 96–106)
Creatinine, Ser: 0.82 mg/dL (ref 0.57–1.00)
Globulin, Total: 2.1 g/dL (ref 1.5–4.5)
Glucose: 89 mg/dL (ref 70–99)
Potassium: 4 mmol/L (ref 3.5–5.2)
Sodium: 140 mmol/L (ref 134–144)
Total Protein: 6.3 g/dL (ref 6.0–8.5)
eGFR: 93 mL/min/1.73 (ref >59)

## 2023-10-17 LAB — CBC WITH DIFFERENTIAL/PLATELET
Basophils Absolute: 0 x10E3/uL (ref 0.0–0.2)
Basos: 0 %
EOS (ABSOLUTE): 0 x10E3/uL (ref 0.0–0.4)
Eos: 1 %
Hematocrit: 39.1 % (ref 34.0–46.6)
Hemoglobin: 12.8 g/dL (ref 11.1–15.9)
Immature Grans (Abs): 0 x10E3/uL (ref 0.0–0.1)
Immature Granulocytes: 0 %
Lymphocytes Absolute: 0.9 x10E3/uL (ref 0.7–3.1)
Lymphs: 15 %
MCH: 30.5 pg (ref 26.6–33.0)
MCHC: 32.7 g/dL (ref 31.5–35.7)
MCV: 93 fL (ref 79–97)
Monocytes Absolute: 0.3 x10E3/uL (ref 0.1–0.9)
Monocytes: 6 %
Neutrophils Absolute: 4.6 x10E3/uL (ref 1.4–7.0)
Neutrophils: 78 %
Platelets: 171 x10E3/uL (ref 150–450)
RBC: 4.2 x10E6/uL (ref 3.77–5.28)
RDW: 13.2 % (ref 11.7–15.4)
WBC: 5.9 x10E3/uL (ref 3.4–10.8)

## 2023-10-17 LAB — LIPID PANEL W/O CHOL/HDL RATIO
Cholesterol, Total: 104 mg/dL (ref 100–199)
HDL: 36 mg/dL — ABNORMAL LOW (ref >39)
LDL Chol Calc (NIH): 43 mg/dL (ref 0–99)
Triglycerides: 147 mg/dL (ref 0–149)
VLDL Cholesterol Cal: 25 mg/dL (ref 5–40)

## 2023-10-17 LAB — TSH: TSH: 1.88 u[IU]/mL (ref 0.450–4.500)

## 2023-10-19 ENCOUNTER — Telehealth: Payer: Self-pay

## 2023-10-19 NOTE — Patient Instructions (Signed)
 Holly Nicholson - I am sorry I was unable to reach you today for our scheduled appointment. I work with Nicholson Duwaine SQUIBB, DO and am calling to support your healthcare needs. Please contact me at [phone] at your earliest convenience. I look forward to speaking with you soon.   Thank you,   Holly Nicholson [facility] Health RN Care Manager Direct Dial: [phone]  Fax: [phone] Website: [email or web address]

## 2023-10-21 DIAGNOSIS — Z419 Encounter for procedure for purposes other than remedying health state, unspecified: Secondary | ICD-10-CM | POA: Diagnosis not present

## 2023-10-22 ENCOUNTER — Other Ambulatory Visit: Payer: Self-pay

## 2023-11-05 DIAGNOSIS — R55 Syncope and collapse: Secondary | ICD-10-CM

## 2023-11-14 ENCOUNTER — Ambulatory Visit
Admission: RE | Admit: 2023-11-14 | Discharge: 2023-11-14 | Disposition: A | Discharge status: Home / Self Care | Source: Ambulatory Visit | Attending: Family Medicine | Admitting: Family Medicine

## 2023-11-14 ENCOUNTER — Ambulatory Visit (INDEPENDENT_AMBULATORY_CARE_PROVIDER_SITE_OTHER): Admitting: Family Medicine

## 2023-11-14 ENCOUNTER — Encounter: Payer: Self-pay | Admitting: Family Medicine

## 2023-11-14 VITALS — BP 131/84 | HR 55 | Temp 98.4°F | Ht 60.0 in | Wt 232.4 lb

## 2023-11-14 DIAGNOSIS — R55 Syncope and collapse: Secondary | ICD-10-CM

## 2023-11-14 DIAGNOSIS — M898X8 Other specified disorders of bone, other site: Secondary | ICD-10-CM | POA: Diagnosis not present

## 2023-11-14 DIAGNOSIS — M25511 Pain in right shoulder: Secondary | ICD-10-CM | POA: Diagnosis not present

## 2023-11-14 DIAGNOSIS — M47812 Spondylosis without myelopathy or radiculopathy, cervical region: Secondary | ICD-10-CM | POA: Diagnosis not present

## 2023-11-14 NOTE — Progress Notes (Signed)
 BP 131/84   Pulse (!) 55   Temp 98.4 F (36.9 C) (Oral)   Ht 5' (1.524 m)   Wt 232 lb 6 oz (105.4 kg)   LMP 08/13/2017 (Exact Date)   SpO2 100%   BMI 45.38 kg/m    Subjective:    Patient ID: Holly Nicholson, female    DOB: March 17, 1985, 38 y.o.   MRN: [id number]  HPI: Holly Nicholson is a 38 y.o. female  Chief Complaint  Patient presents with   Follow-up    Monitor    Shoulder Pain   Dizziness is doing much better since her ex-husband left.    SHOULDER PAIN Duration: about 2 months Involved shoulder: right Mechanism of injury: fall in the shoulder Location: posterior Onset:sudden Severity: moderate Quality:  shooting and aching Frequency: constant- a little worse Radiation: no Aggravating factors: movement  Alleviating factors: APAP, NSAIDs, and rest  Status: worse Treatments attempted: rest, APAP, and ibuprofen   Relief with NSAIDs?:  mild Weakness: no Numbness: no Decreased grip strength: yes Redness: no Swelling: no Bruising: no Fevers: no  Relevant past medical, surgical, family and social history reviewed and updated as indicated. Interim medical history since our last visit reviewed. Allergies and medications reviewed and updated.  Review of Systems  Constitutional: Negative.   Respiratory: Negative.    Cardiovascular: Negative.   Musculoskeletal:  Positive for arthralgias. Negative for back pain, gait problem, joint swelling, myalgias, neck pain and neck stiffness.  Skin: Negative.   Psychiatric/Behavioral: Negative.      Per HPI unless specifically indicated above     Objective:    BP 131/84   Pulse (!) 55   Temp 98.4 F (36.9 C) (Oral)   Ht 5' (1.524 m)   Wt 232 lb 6 oz (105.4 kg)   LMP 08/13/2017 (Exact Date)   SpO2 100%   BMI 45.38 kg/m   Wt Readings from Last 3 Encounters:  11/14/23 232 lb 6 oz (105.4 kg)  10/16/23 242 lb 3.2 oz (109.9 kg)  07/19/23 240 lb (108.9 kg)    Physical Exam Vitals and nursing note reviewed.   Constitutional:      General: She is not in acute distress.    Appearance: Normal appearance. She is obese. She is not ill-appearing, toxic-appearing or diaphoretic.  HENT:     Head: Normocephalic and atraumatic.     Right Ear: External ear normal.     Left Ear: External ear normal.     Nose: Nose normal.     Mouth/Throat:     Mouth: Mucous membranes are moist.     Pharynx: Oropharynx is clear.  Eyes:     General: No scleral icterus.       Right eye: No discharge.        Left eye: No discharge.     Extraocular Movements: Extraocular movements intact.     Conjunctiva/sclera: Conjunctivae normal.     Pupils: Pupils are equal, round, and reactive to light.  Cardiovascular:     Rate and Rhythm: Normal rate and regular rhythm.     Pulses: Normal pulses.     Heart sounds: Normal heart sounds. No murmur heard.    No friction rub. No gallop.  Pulmonary:     Effort: Pulmonary effort is normal. No respiratory distress.     Breath sounds: Normal breath sounds. No stridor. No wheezing, rhonchi or rales.  Chest:     Chest wall: No tenderness.  Musculoskeletal:  General: Normal range of motion.     Cervical back: Normal range of motion and neck supple.  Skin:    General: Skin is warm and dry.     Capillary Refill: Capillary refill takes less than 2 seconds.     Coloration: Skin is not jaundiced or pale.     Findings: No bruising, erythema, lesion or rash.  Neurological:     General: No focal deficit present.     Mental Status: She is alert and oriented to person, place, and time. Mental status is at baseline.  Psychiatric:        Mood and Affect: Mood normal.        Behavior: Behavior normal.        Thought Content: Thought content normal.        Judgment: Judgment normal.     Results for orders placed or performed in visit on 10/16/23  Comprehensive metabolic panel with GFR   Collection Time: 10/16/23 11:28 AM  Result Value Ref Range   Glucose 89 70 - 99 mg/dL   BUN 7 6  - 20 mg/dL   Creatinine, Ser 9.17 0.57 - 1.00 mg/dL   eGFR 93 >40 fO/fpw/8.26   BUN/Creatinine Ratio 9 9 - 23   Sodium 140 134 - 144 mmol/L   Potassium 4.0 3.5 - 5.2 mmol/L   Chloride 102 96 - 106 mmol/L   CO2 25 20 - 29 mmol/L   Calcium  8.9 8.7 - 10.2 mg/dL   Total Protein 6.3 6.0 - 8.5 g/dL   Albumin 4.2 3.9 - 4.9 g/dL   Globulin, Total 2.1 1.5 - 4.5 g/dL   Bilirubin Total 1.1 0.0 - 1.2 mg/dL   Alkaline Phosphatase 104 41 - 116 IU/L   AST 14 0 - 40 IU/L   ALT 17 0 - 32 IU/L  CBC with Differential/Platelet   Collection Time: 10/16/23 11:28 AM  Result Value Ref Range   WBC 5.9 3.4 - 10.8 x10E3/uL   RBC 4.20 3.77 - 5.28 x10E6/uL   Hemoglobin 12.8 11.1 - 15.9 g/dL   Hematocrit 60.8 65.9 - 46.6 %   MCV 93 79 - 97 fL   MCH 30.5 26.6 - 33.0 pg   MCHC 32.7 31.5 - 35.7 g/dL   RDW 86.7 88.2 - 84.5 %   Platelets 171 150 - 450 x10E3/uL   Neutrophils 78 Not Estab. %   Lymphs 15 Not Estab. %   Monocytes 6 Not Estab. %   Eos 1 Not Estab. %   Basos 0 Not Estab. %   Neutrophils Absolute 4.6 1.4 - 7.0 x10E3/uL   Lymphocytes Absolute 0.9 0.7 - 3.1 x10E3/uL   Monocytes Absolute 0.3 0.1 - 0.9 x10E3/uL   EOS (ABSOLUTE) 0.0 0.0 - 0.4 x10E3/uL   Basophils Absolute 0.0 0.0 - 0.2 x10E3/uL   Immature Granulocytes 0 Not Estab. %   Immature Grans (Abs) 0.0 0.0 - 0.1 x10E3/uL  Lipid Panel w/o Chol/HDL Ratio   Collection Time: 10/16/23 11:28 AM  Result Value Ref Range   Cholesterol, Total 104 100 - 199 mg/dL   Triglycerides 852 0 - 149 mg/dL   HDL 36 (L) >60 mg/dL   VLDL Cholesterol Cal 25 5 - 40 mg/dL   LDL Chol Calc (NIH) 43 0 - 99 mg/dL  B Nat Peptide   Collection Time: 10/16/23 11:28 AM  Result Value Ref Range   BNP 57.3 0.0 - 100.0 pg/mL  TSH   Collection Time: 10/16/23 11:28 AM  Result Value Ref  Range   TSH 1.880 0.450 - 4.500 uIU/mL      Assessment & Plan:   Problem List Items Addressed This Visit   None Visit Diagnoses       Acute pain of right shoulder    -  Primary   Did  not get her x-ray. It's getting worse- will go over for x-ray and will start her with PT. Call with any concerns. Recheck in about 6 weeks.   Relevant Orders   Ambulatory referral to Physical Therapy     Near syncope       Zio reviewed with patient. Resolved since her ex husband moved out. Call with any concerns.        Follow up plan: Return in about 6 weeks (around 12/26/2023).

## 2023-11-16 ENCOUNTER — Other Ambulatory Visit (HOSPITAL_COMMUNITY): Payer: Self-pay

## 2023-11-16 ENCOUNTER — Encounter: Payer: Self-pay | Admitting: Pharmacist

## 2023-11-16 ENCOUNTER — Other Ambulatory Visit: Payer: Self-pay

## 2023-11-16 ENCOUNTER — Ambulatory Visit: Admitting: Family Medicine

## 2023-11-20 DIAGNOSIS — M4722 Other spondylosis with radiculopathy, cervical region: Secondary | ICD-10-CM | POA: Insufficient documentation

## 2023-11-21 ENCOUNTER — Other Ambulatory Visit: Payer: Self-pay

## 2023-11-23 ENCOUNTER — Other Ambulatory Visit: Payer: Self-pay

## 2023-11-23 ENCOUNTER — Other Ambulatory Visit (HOSPITAL_COMMUNITY): Payer: Self-pay

## 2023-11-27 ENCOUNTER — Telehealth: Payer: Self-pay

## 2023-11-28 NOTE — Patient Instructions (Signed)
 Holly Nicholson - I have attempted to call you three times but have been unsuccessful in reaching you. I work with Nicholson Duwaine SQUIBB, DO and am calling to support your healthcare needs. If I can be of assistance to you, please contact me.  Thank you,   Jackson Acron [facility] RN Care Manager Direct Dial: [phone]  Fax: [phone] Website: [email or web address]

## 2023-12-15 ENCOUNTER — Emergency Department

## 2023-12-15 ENCOUNTER — Emergency Department
Admission: EM | Admit: 2023-12-15 | Discharge: 2023-12-15 | Disposition: A | Discharge status: Home / Self Care | Attending: Emergency Medicine | Admitting: Emergency Medicine

## 2023-12-15 ENCOUNTER — Other Ambulatory Visit: Payer: Self-pay

## 2023-12-15 ENCOUNTER — Encounter: Payer: Self-pay | Admitting: Emergency Medicine

## 2023-12-15 DIAGNOSIS — M7989 Other specified soft tissue disorders: Secondary | ICD-10-CM | POA: Diagnosis not present

## 2023-12-15 DIAGNOSIS — M25562 Pain in left knee: Secondary | ICD-10-CM | POA: Diagnosis not present

## 2023-12-15 DIAGNOSIS — I10 Essential (primary) hypertension: Secondary | ICD-10-CM | POA: Diagnosis not present

## 2023-12-15 MED ORDER — ACETAMINOPHEN 500 MG PO TABS
1000.0000 mg | ORAL_TABLET | Freq: Once | ORAL | Status: AC
  Administered 2023-12-15: 1000 mg via ORAL
  Filled 2023-12-15: qty 2

## 2023-12-15 NOTE — Discharge Instructions (Addendum)
 You were seen in the emergency department for pain in your left knee.  Please wear the brace at all times except when showering.  Please follow-up with the orthopedist listed in this paperwork.  Give their office a call to establish care and schedule an appointment.  You may use Tylenol  at home for pain based on the instructions on the bottle.  You may also use topical lidocaine  gel to help with your pain.  Ice what hurts for 20 to 30 minutes at a time and elevate your leg above the level of your heart to reduce swelling.  Turn to emergency department for any new, worsening or concerning symptoms.
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
## 2023-12-15 NOTE — ED Triage Notes (Signed)
 Patient arrives ambulatory by POV c/o left knee pain. States when waking this morning she stood up and couldn't put pressure on leg. Denies any injury.

## 2023-12-15 NOTE — ED Notes (Signed)
 Patient given hospital gown and sheet with instructions to change. Patient declined warm blanket. Visitor at bedside.

## 2023-12-24 ENCOUNTER — Ambulatory Visit: Admitting: Family Medicine

## 2024-02-15 ENCOUNTER — Other Ambulatory Visit (HOSPITAL_COMMUNITY): Payer: Self-pay

## 2024-02-15 ENCOUNTER — Other Ambulatory Visit: Payer: Self-pay
